# Patient Record
Sex: Male | Born: 1960 | Race: Black or African American | Hispanic: No | Marital: Single | Smoking: Former smoker
Health system: Southern US, Community
[De-identification: ages and names within clinical notes are randomized; demographics above are authoritative.]

## PROBLEM LIST (undated history)

## (undated) DIAGNOSIS — I700 Atherosclerosis of aorta: Secondary | ICD-10-CM

## (undated) DIAGNOSIS — I2089 Other forms of angina pectoris: Secondary | ICD-10-CM

## (undated) DIAGNOSIS — I509 Heart failure, unspecified: Secondary | ICD-10-CM

## (undated) DIAGNOSIS — R9431 Abnormal electrocardiogram [ECG] [EKG]: Secondary | ICD-10-CM

## (undated) DIAGNOSIS — R0609 Other forms of dyspnea: Secondary | ICD-10-CM

## (undated) DIAGNOSIS — I208 Other forms of angina pectoris: Secondary | ICD-10-CM

## (undated) DIAGNOSIS — I2510 Atherosclerotic heart disease of native coronary artery without angina pectoris: Secondary | ICD-10-CM

## (undated) DIAGNOSIS — G459 Transient cerebral ischemic attack, unspecified: Secondary | ICD-10-CM

## (undated) DIAGNOSIS — I10 Essential (primary) hypertension: Secondary | ICD-10-CM

## (undated) DIAGNOSIS — I214 Non-ST elevation (NSTEMI) myocardial infarction: Secondary | ICD-10-CM

## (undated) DIAGNOSIS — E785 Hyperlipidemia, unspecified: Secondary | ICD-10-CM

## (undated) DIAGNOSIS — I429 Cardiomyopathy, unspecified: Secondary | ICD-10-CM

## (undated) DIAGNOSIS — F17210 Nicotine dependence, cigarettes, uncomplicated: Secondary | ICD-10-CM

## (undated) DIAGNOSIS — I2584 Coronary atherosclerosis due to calcified coronary lesion: Secondary | ICD-10-CM

## (undated) HISTORY — DX: Atherosclerotic heart disease of native coronary artery without angina pectoris: I25.10

## (undated) HISTORY — DX: Other forms of dyspnea: R06.09

## (undated) HISTORY — DX: Cardiomyopathy, unspecified: I42.9

## (undated) HISTORY — DX: Hyperlipidemia, unspecified: E78.5

## (undated) HISTORY — DX: Non-ST elevation (NSTEMI) myocardial infarction: I21.4

## (undated) HISTORY — DX: Atherosclerotic heart disease of native coronary artery without angina pectoris: I25.84

## (undated) HISTORY — DX: Other forms of angina pectoris: I20.89

## (undated) HISTORY — DX: Atherosclerosis of aorta: I70.0

## (undated) HISTORY — DX: Abnormal electrocardiogram (ECG) (EKG): R94.31

## (undated) HISTORY — DX: Other forms of angina pectoris: I20.8

## (undated) HISTORY — DX: Nicotine dependence, cigarettes, uncomplicated: F17.210

## (undated) HISTORY — DX: Essential (primary) hypertension: I10

## (undated) HISTORY — DX: Transient cerebral ischemic attack, unspecified: G45.9

## (undated) HISTORY — DX: Heart failure, unspecified: I50.9

---

## 2010-09-04 ENCOUNTER — Emergency Department (HOSPITAL_COMMUNITY)
Admission: EM | Admit: 2010-09-04 | Discharge: 2010-09-04 | Disposition: A | Discharge status: Home / Self Care | Payer: Self-pay | Attending: Emergency Medicine | Admitting: Emergency Medicine

## 2010-09-04 ENCOUNTER — Emergency Department (HOSPITAL_COMMUNITY): Payer: Self-pay

## 2010-09-04 DIAGNOSIS — R079 Chest pain, unspecified: Secondary | ICD-10-CM | POA: Insufficient documentation

## 2010-09-04 DIAGNOSIS — R209 Unspecified disturbances of skin sensation: Secondary | ICD-10-CM | POA: Insufficient documentation

## 2010-09-04 DIAGNOSIS — IMO0002 Reserved for concepts with insufficient information to code with codable children: Secondary | ICD-10-CM | POA: Insufficient documentation

## 2015-08-11 ENCOUNTER — Emergency Department (HOSPITAL_COMMUNITY)
Admission: EM | Admit: 2015-08-11 | Discharge: 2015-08-11 | Disposition: A | Discharge status: Home / Self Care | Payer: Self-pay | Attending: Emergency Medicine | Admitting: Emergency Medicine

## 2015-08-11 ENCOUNTER — Encounter (HOSPITAL_COMMUNITY): Payer: Self-pay | Admitting: Emergency Medicine

## 2015-08-11 DIAGNOSIS — F17210 Nicotine dependence, cigarettes, uncomplicated: Secondary | ICD-10-CM | POA: Insufficient documentation

## 2015-08-11 DIAGNOSIS — D171 Benign lipomatous neoplasm of skin and subcutaneous tissue of trunk: Secondary | ICD-10-CM | POA: Insufficient documentation

## 2015-08-11 DIAGNOSIS — I10 Essential (primary) hypertension: Secondary | ICD-10-CM | POA: Insufficient documentation

## 2015-08-11 LAB — I-STAT CHEM 8, ED
BUN: 10 mg/dL (ref 6–20)
CALCIUM ION: 1.19 mmol/L (ref 1.12–1.23)
Chloride: 103 mmol/L (ref 101–111)
Creatinine, Ser: 0.7 mg/dL (ref 0.61–1.24)
Glucose, Bld: 93 mg/dL (ref 65–99)
HEMATOCRIT: 48 % (ref 39.0–52.0)
HEMOGLOBIN: 16.3 g/dL (ref 13.0–17.0)
Potassium: 3.9 mmol/L (ref 3.5–5.1)
SODIUM: 140 mmol/L (ref 135–145)
TCO2: 22 mmol/L (ref 0–100)

## 2015-08-11 MED ORDER — LISINOPRIL 20 MG PO TABS: 20.0000 mg | ORAL_TABLET | Freq: Every day | ORAL | Status: DC

## 2015-08-11 MED ORDER — HYDROCHLOROTHIAZIDE 25 MG PO TABS: 25.0000 mg | ORAL_TABLET | Freq: Every day | ORAL | Status: DC

## 2015-08-11 NOTE — Discharge Instructions (Signed)
You were seen today for a mass on her back. This is likely a benign growth. If he continues to grow or begins to bother you, you need to be evaluated further. You were noted to be significantly hypertensive. Because you have no symptoms, you were not treated in the ER but will be started on medications as an outpatient. You need to establish primary care and have a recheck.  Lipoma A lipoma is a noncancerous (benign) tumor that is made up of fat cells. This is a very common type of soft-tissue growth. Lipomas are usually found under the skin (subcutaneous). They may occur in any tissue of the body that contains fat. Common areas for lipomas to appear include the back, shoulders, buttocks, and thighs. Lipomas grow slowly, and they are usually painless. Most lipomas do not cause problems and do not require treatment. CAUSES The cause of this condition is not known. RISK FACTORS This condition is more likely to develop in:  People who are 75-42 years old.  People who have a family history of lipomas. SYMPTOMS A lipoma usually appears as a small, round bump under the skin. It may feel soft or rubbery, but the firmness can vary. Most lipomas are not painful. However, a lipoma may become painful if it is located in an area where it pushes on nerves. DIAGNOSIS A lipoma can usually be diagnosed with a physical exam. You may also have tests to confirm the diagnosis and to rule out other conditions. Tests may include:  Imaging tests, such as a CT scan or MRI.  Removal of a tissue sample to be looked at under a microscope (biopsy). TREATMENT Treatment is not needed for small lipomas that are not causing problems. If a lipoma continues to get bigger or it causes problems, removal is often the best option. Lipomas can also be removed to improve appearance. Removal of a lipoma is usually done with a surgery in which the fatty cells and the surrounding capsule are removed. Most often, a medicine that numbs the  area (local anesthetic) is used for this procedure. HOME CARE INSTRUCTIONS  Keep all follow-up visits as directed by your health care provider. This is important. SEEK MEDICAL CARE IF:  Your lipoma becomes larger or hard.  Your lipoma becomes painful, red, or increasingly swollen. These could be signs of infection or a more serious condition.   This information is not intended to replace advice given to you by your health care provider. Make sure you discuss any questions you have with your health care provider.   Document Released: 06/17/2002 Document Revised: 11/11/2014 Document Reviewed: 06/23/2014 Elsevier Interactive Patient Education 2016 [employer]. Hypertension Hypertension, commonly called high blood pressure, is when the force of blood pumping through your arteries is too strong. Your arteries are the blood vessels that carry blood from your heart throughout your body. A blood pressure reading consists of a higher number over a lower number, such as 110/72. The higher number (systolic) is the pressure inside your arteries when your heart pumps. The lower number (diastolic) is the pressure inside your arteries when your heart relaxes. Ideally you want your blood pressure below 120/80. Hypertension forces your heart to work harder to pump blood. Your arteries may become narrow or stiff. Having untreated or uncontrolled hypertension can cause heart attack, stroke, kidney disease, and other problems. RISK FACTORS Some risk factors for high blood pressure are controllable. Others are not.  Risk factors you cannot control include:   Race. You may  be at higher risk if you are African American.  Age. Risk increases with age.  Gender. Men are at higher risk than women before age 7 years. After age 70, women are at higher risk than men. Risk factors you can control include:  Not getting enough exercise or physical activity.  Being overweight.  Getting too much fat, sugar,  calories, or salt in your diet.  Drinking too much alcohol. SIGNS AND SYMPTOMS Hypertension does not usually cause signs or symptoms. Extremely high blood pressure (hypertensive crisis) may cause headache, anxiety, shortness of breath, and nosebleed. DIAGNOSIS To check if you have hypertension, your health care provider will measure your blood pressure while you are seated, with your arm held at the level of your heart. It should be measured at least twice using the same arm. Certain conditions can cause a difference in blood pressure between your right and left arms. A blood pressure reading that is higher than normal on one occasion does not mean that you need treatment. If it is not clear whether you have high blood pressure, you may be asked to return on a different day to have your blood pressure checked again. Or, you may be asked to monitor your blood pressure at home for 1 or more weeks. TREATMENT Treating high blood pressure includes making lifestyle changes and possibly taking medicine. Living a healthy lifestyle can help lower high blood pressure. You may need to change some of your habits. Lifestyle changes may include:  Following the DASH diet. This diet is high in fruits, vegetables, and whole grains. It is low in salt, red meat, and added sugars.  Keep your sodium intake below 2,300 mg per day.  Getting at least 30-45 minutes of aerobic exercise at least 4 times per week.  Losing weight if necessary.  Not smoking.  Limiting alcoholic beverages.  Learning ways to reduce stress. Your health care provider may prescribe medicine if lifestyle changes are not enough to get your blood pressure under control, and if one of the following is true:  You are 34-58 years of age and your systolic blood pressure is above 140.  You are 23 years of age or older, and your systolic blood pressure is above 150.  Your diastolic blood pressure is above 90.  You have diabetes, and your  systolic blood pressure is over [id number] or your diastolic blood pressure is over 90.  You have kidney disease and your blood pressure is above 140/90.  You have heart disease and your blood pressure is above 140/90. Your personal target blood pressure may vary depending on your medical conditions, your age, and other factors. HOME CARE INSTRUCTIONS  Have your blood pressure rechecked as directed by your health care provider.   Take medicines only as directed by your health care provider. Follow the directions carefully. Blood pressure medicines must be taken as prescribed. The medicine does not work as well when you skip doses. Skipping doses also puts you at risk for problems.  Do not smoke.   Monitor your blood pressure at home as directed by your health care provider. SEEK MEDICAL CARE IF:   You think you are having a reaction to medicines taken.  You have recurrent headaches or feel dizzy.  You have swelling in your ankles.  You have trouble with your vision. SEEK IMMEDIATE MEDICAL CARE IF:  You develop a severe headache or confusion.  You have unusual weakness, numbness, or feel faint.  You have severe chest or abdominal pain.  You vomit repeatedly.  You have trouble breathing. MAKE SURE YOU:   Understand these instructions.  Will watch your condition.  Will get help right away if you are not doing well or get worse.   This information is not intended to replace advice given to you by your health care provider. Make sure you discuss any questions you have with your health care provider.   Document Released: 06/27/2005 Document Revised: 11/11/2014 Document Reviewed: 04/19/2013 Elsevier Interactive Patient Education [employer].

## 2015-08-11 NOTE — ED Provider Notes (Signed)
CSN: [id number]     Arrival date & time 08/11/15  0506 History   First MD Initiated Contact with Patient 08/11/15 0510     Chief Complaint  Patient presents with  . Mass     (Consider location/radiation/quality/duration/timing/severity/associated sxs/prior Treatment) HPI  This is a 55 year old male with no reported past medical history who presents with a knot on his back. Patient reports he has had a knot on his back since November. It has been slowly growing. Over last 2 days it has begun to bother him more at night. He denies any pain but states it is uncomfortable for him to lay on it. Denies any redness or fevers. Patient denies history of high blood pressure but was noted to be hypertensive upon arrival to 240/132. Repeat blood pressure 181/117. Denies any chest pain or headache.  History reviewed. No pertinent past medical history. History reviewed. No pertinent past surgical history. History reviewed. No pertinent family history. Social History  Substance Use Topics  . Smoking status: Current Every Day Smoker -- 0.50 packs/day    Types: Cigarettes  . Smokeless tobacco: None  . Alcohol Use: Yes     Comment: 2 X weekly - 6pack - 12 pack    Review of Systems  Constitutional: Negative for fever.  Respiratory: Negative for chest tightness and shortness of breath.   Cardiovascular: Negative for chest pain.  Musculoskeletal:       Knot on back  Neurological: Negative for headaches.  All other systems reviewed and are negative.     Allergies  Review of patient's allergies indicates no known allergies.  Home Medications   Prior to Admission medications   Medication Sig Start Date End Date Taking? Authorizing Provider  hydrochlorothiazide (HYDRODIURIL) 25 MG tablet Take 1 tablet (25 mg total) by mouth daily. 08/11/15   Merryl Hacker, MD  lisinopril (PRINIVIL,ZESTRIL) 20 MG tablet Take 1 tablet (20 mg total) by mouth daily. 08/11/15   Merryl Hacker, MD   BP 240/132  mmHg  Pulse 53  Temp(Src) 98.1 F (36.7 C)  Resp 16  Ht 6\' 1"  (1.854 m)  Wt 152 lb (68.947 kg)  BMI 20.06 kg/m2  SpO2 99% Physical Exam  Constitutional: He is oriented to person, place, and time. He appears well-developed and well-nourished.  HENT:  Head: Normocephalic and atraumatic.  Cardiovascular: Normal rate, regular rhythm and normal heart sounds.   No murmur heard. Pulmonary/Chest: Effort normal and breath sounds normal. No respiratory distress. He has no wheezes.  Abdominal: Soft. Bowel sounds are normal. There is no tenderness. There is no rebound.  Musculoskeletal:  1 cm well-circumscribed soft mass noted over the right posterior chest, mobile, nontender, no overlying skin changes  Neurological: He is alert and oriented to person, place, and time.  Skin: Skin is warm and dry.  Psychiatric: He has a normal mood and affect.  Nursing note and vitals reviewed.   ED Course  Procedures (including critical care time) Labs Review Labs Reviewed  I-STAT CHEM 8, ED    Imaging Review No results found. I have personally reviewed and evaluated these images and lab results as part of my medical decision-making.   EKG Interpretation   Date/Time:  Tuesday August 11 2015 05:35:13 EST Ventricular Rate:  51 PR Interval:  180 QRS Duration: 93 QT Interval:  445 QTC Calculation: 410 R Axis:   43 Text Interpretation:  Sinus rhythm Probable left atrial enlargement Left  ventricular hypertrophy Early repolarization No significant change since  last  tracing Confirmed by Philicia Heyne  MD, Noble ([id number]) on 08/11/2015  5:56:31 AM      MDM   Final diagnoses:  Essential hypertension  Lipoma of back    Patient presents with a knot on his back. Ongoing over the last several months. It has benign features and is most consistent with a lipoma. Cyst is also a possibility. Doubt abscess. Discussed with the patient that he can continue to monitor and if it continues to bother him, he will  need follow-up for possible excision. He does have insurance with [place] [employer] but does not have primary physician. Discussed with that his blood pressure is up. EKG changes show LVH and he likely has hypertension at baseline. He is currently asymptomatic. Chem-8 was checked and patient will be started on lisinopril and HCTZ. He was given information for primary care follow-up and encouraged to establish primary care. He stated understanding.  After history, exam, and medical workup I feel the patient has been appropriately medically screened and is safe for discharge home. Pertinent diagnoses were discussed with the patient. Patient was given return precautions.     Merryl Hacker, MD 08/11/15 [phone]

## 2015-08-11 NOTE — ED Notes (Signed)
Pt reports he has had a "knot" on his back since Thanksgiving however it has "been getting bigger this past month."

## 2016-03-03 ENCOUNTER — Encounter (HOSPITAL_COMMUNITY): Payer: Self-pay | Admitting: *Deleted

## 2016-03-03 ENCOUNTER — Observation Stay (HOSPITAL_COMMUNITY)
Admission: EM | Admit: 2016-03-03 | Discharge: 2016-03-04 | Disposition: A | Discharge status: Home / Self Care | Payer: Self-pay | Attending: Oncology | Admitting: Oncology

## 2016-03-03 ENCOUNTER — Inpatient Hospital Stay (HOSPITAL_COMMUNITY): Payer: Self-pay

## 2016-03-03 ENCOUNTER — Emergency Department (HOSPITAL_COMMUNITY): Payer: Self-pay

## 2016-03-03 DIAGNOSIS — I110 Hypertensive heart disease with heart failure: Secondary | ICD-10-CM | POA: Insufficient documentation

## 2016-03-03 DIAGNOSIS — I429 Cardiomyopathy, unspecified: Secondary | ICD-10-CM | POA: Diagnosis present

## 2016-03-03 DIAGNOSIS — Z87891 Personal history of nicotine dependence: Secondary | ICD-10-CM

## 2016-03-03 DIAGNOSIS — R4701 Aphasia: Secondary | ICD-10-CM | POA: Insufficient documentation

## 2016-03-03 DIAGNOSIS — G459 Transient cerebral ischemic attack, unspecified: Principal | ICD-10-CM | POA: Diagnosis present

## 2016-03-03 DIAGNOSIS — Z7982 Long term (current) use of aspirin: Secondary | ICD-10-CM | POA: Insufficient documentation

## 2016-03-03 DIAGNOSIS — G8191 Hemiplegia, unspecified affecting right dominant side: Secondary | ICD-10-CM | POA: Insufficient documentation

## 2016-03-03 DIAGNOSIS — R9431 Abnormal electrocardiogram [ECG] [EKG]: Secondary | ICD-10-CM

## 2016-03-03 DIAGNOSIS — I119 Hypertensive heart disease without heart failure: Secondary | ICD-10-CM

## 2016-03-03 DIAGNOSIS — Z720 Tobacco use: Secondary | ICD-10-CM

## 2016-03-03 DIAGNOSIS — I5020 Unspecified systolic (congestive) heart failure: Secondary | ICD-10-CM | POA: Insufficient documentation

## 2016-03-03 DIAGNOSIS — F17210 Nicotine dependence, cigarettes, uncomplicated: Secondary | ICD-10-CM | POA: Insufficient documentation

## 2016-03-03 DIAGNOSIS — I10 Essential (primary) hypertension: Secondary | ICD-10-CM

## 2016-03-03 DIAGNOSIS — I159 Secondary hypertension, unspecified: Secondary | ICD-10-CM

## 2016-03-03 LAB — COMPREHENSIVE METABOLIC PANEL
ALBUMIN: 4.1 g/dL (ref 3.5–5.0)
ALK PHOS: 49 U/L (ref 38–126)
ALT: 20 U/L (ref 17–63)
ANION GAP: 7 (ref 5–15)
AST: 27 U/L (ref 15–41)
BILIRUBIN TOTAL: 0.8 mg/dL (ref 0.3–1.2)
BUN: 14 mg/dL (ref 6–20)
CALCIUM: 9.3 mg/dL (ref 8.9–10.3)
CO2: 23 mmol/L (ref 22–32)
Chloride: 106 mmol/L (ref 101–111)
Creatinine, Ser: 0.87 mg/dL (ref 0.61–1.24)
GFR calc Af Amer: >60 mL/min (ref >60)
GLUCOSE: 105 mg/dL — AB (ref 65–99)
Potassium: 3.9 mmol/L (ref 3.5–5.1)
Sodium: 136 mmol/L (ref 135–145)
TOTAL PROTEIN: 7.7 g/dL (ref 6.5–8.1)

## 2016-03-03 LAB — DIFFERENTIAL
Basophils Absolute: 0 10*3/uL (ref 0.0–0.1)
Basophils Relative: 1 %
EOS PCT: 3 %
Eosinophils Absolute: 0.2 10*3/uL (ref 0.0–0.7)
LYMPHS ABS: 2.3 10*3/uL (ref 0.7–4.0)
LYMPHS PCT: 37 %
MONOS PCT: 9 %
Monocytes Absolute: 0.6 10*3/uL (ref 0.1–1.0)
NEUTROS PCT: 50 %
Neutro Abs: 3.2 10*3/uL (ref 1.7–7.7)

## 2016-03-03 LAB — PROTIME-INR
INR: 0.97
Prothrombin Time: 12.9 seconds (ref 11.4–15.2)

## 2016-03-03 LAB — TROPONIN I
Troponin I: <0.03 ng/mL
Troponin I: <0.03 ng/mL

## 2016-03-03 LAB — I-STAT CHEM 8, ED
BUN: 16 mg/dL (ref 6–20)
CALCIUM ION: 1.13 mmol/L (ref 1.13–1.30)
CHLORIDE: 105 mmol/L (ref 101–111)
Creatinine, Ser: 0.8 mg/dL (ref 0.61–1.24)
GLUCOSE: 102 mg/dL — AB (ref 65–99)
HCT: 46 % (ref 39.0–52.0)
Hemoglobin: 15.6 g/dL (ref 13.0–17.0)
Potassium: 3.9 mmol/L (ref 3.5–5.1)
Sodium: 141 mmol/L (ref 135–145)
TCO2: 24 mmol/L (ref 0–100)

## 2016-03-03 LAB — CBC
HEMATOCRIT: 43.1 % (ref 39.0–52.0)
HEMOGLOBIN: 14.1 g/dL (ref 13.0–17.0)
MCH: 29.2 pg (ref 26.0–34.0)
MCHC: 32.7 g/dL (ref 30.0–36.0)
MCV: 89.2 fL (ref 78.0–100.0)
Platelets: 259 10*3/uL (ref 150–400)
RBC: 4.83 MIL/uL (ref 4.22–5.81)
RDW: 13.4 % (ref 11.5–15.5)
WBC: 6.3 10*3/uL (ref 4.0–10.5)

## 2016-03-03 LAB — RAPID URINE DRUG SCREEN, HOSP PERFORMED
Amphetamines: NOT DETECTED
Barbiturates: NOT DETECTED
Benzodiazepines: NOT DETECTED
Cocaine: NOT DETECTED
Opiates: NOT DETECTED
Tetrahydrocannabinol: NOT DETECTED

## 2016-03-03 LAB — APTT: APTT: 31 s (ref 24–36)

## 2016-03-03 LAB — I-STAT TROPONIN, ED: Troponin i, poc: 0 ng/mL (ref 0.00–0.08)

## 2016-03-03 LAB — CBG MONITORING, ED: Glucose-Capillary: 87 mg/dL (ref 65–99)

## 2016-03-03 MED ORDER — ASPIRIN 81 MG PO CHEW
81.0000 mg | CHEWABLE_TABLET | Freq: Every day | ORAL | Status: DC
  Administered 2016-03-03 – 2016-03-04 (×2): 81 mg via ORAL
  Filled 2016-03-03 (×2): qty 1

## 2016-03-03 MED ORDER — ENOXAPARIN SODIUM 40 MG/0.4ML ~~LOC~~ SOLN: 40.0000 mg | SUBCUTANEOUS | Status: DC

## 2016-03-03 MED ORDER — STROKE: EARLY STAGES OF RECOVERY BOOK
Freq: Once | Status: AC
  Administered 2016-03-03: 20:00:00
  Filled 2016-03-03: qty 1

## 2016-03-03 MED ORDER — STUDY - INVESTIGATIONAL DRUG SIMPLE RECORD
600.0000 mg | Status: AC
  Filled 2016-03-03: qty 0.12

## 2016-03-03 MED ORDER — NICARDIPINE HCL IN NACL 20-0.86 MG/200ML-% IV SOLN: 5.0000 mg/h | Freq: Once | INTRAVENOUS | Status: DC

## 2016-03-03 MED ORDER — STUDY - INVESTIGATIONAL DRUG SIMPLE RECORD
75.0000 mg | Freq: Every day | Status: DC
  Administered 2016-03-04: 75 mg via ORAL
  Filled 2016-03-03: qty 0.01

## 2016-03-03 MED ORDER — IOPAMIDOL (ISOVUE-370) INJECTION 76%
INTRAVENOUS | Status: AC
  Filled 2016-03-03: qty 50

## 2016-03-03 MED ORDER — ASPIRIN 81 MG PO CHEW: 81.0000 mg | CHEWABLE_TABLET | Freq: Every day | ORAL | Status: DC

## 2016-03-03 MED ORDER — AMLODIPINE BESYLATE 5 MG PO TABS
10.0000 mg | ORAL_TABLET | Freq: Every day | ORAL | Status: DC
  Administered 2016-03-03 – 2016-03-04 (×2): 10 mg via ORAL
  Filled 2016-03-03 (×2): qty 2

## 2016-03-03 NOTE — ED Provider Notes (Signed)
[facility] DEPT Provider Note   CSN: [id number] Arrival date & time: 03/03/16  [id number]   An emergency department physician performed an initial assessment on this suspected stroke patient at 62.  History   Chief Complaint Chief Complaint  Patient presents with  . Code Stroke    HPI Sean Leblanc is a 55 y.o. male.  The history is provided by the patient and the EMS personnel.  Weakness  This is a new problem. The current episode started 1 to 2 hours ago. The problem occurs constantly. The problem has been gradually worsening. Associated symptoms include headaches. Pertinent negatives include no chest pain and no shortness of breath. Nothing aggravates the symptoms. Nothing relieves the symptoms. He has tried nothing for the symptoms. The treatment provided no relief.    No past medical history on file.  Patient Active Problem List   Diagnosis Date Noted  . TIA (transient ischemic attack) 03/03/2016  . HTN (hypertension) 03/03/2016  . Tobacco abuse 03/03/2016    No past surgical history on file.     Home Medications    Prior to Admission medications   Medication Sig Start Date End Date Taking? Authorizing Provider  ibuprofen (ADVIL,MOTRIN) 200 MG tablet Take 200 mg by mouth every 6 (six) hours as needed for moderate pain.   Yes Historical Provider, MD  hydrochlorothiazide (HYDRODIURIL) 25 MG tablet Take 1 tablet (25 mg total) by mouth daily. Patient not taking: Reported on 03/03/2016 08/11/15   Merryl Hacker, MD  lisinopril (PRINIVIL,ZESTRIL) 20 MG tablet Take 1 tablet (20 mg total) by mouth daily. Patient not taking: Reported on 03/03/2016 08/11/15   Merryl Hacker, MD    Family History No family history on file.  Social History Social History  Substance Use Topics  . Smoking status: Current Every Day Smoker    Packs/day: 0.50    Types: Cigarettes  . Smokeless tobacco: Not on file  . Alcohol use Yes     Comment: 2 X weekly - 6pack - 12 pack      Allergies   Review of patient's allergies indicates no known allergies.   Review of Systems Review of Systems  Respiratory: Negative for shortness of breath.   Cardiovascular: Negative for chest pain.  Neurological: Positive for weakness (right sided facial droop) and headaches.  All other systems reviewed and are negative.    Physical Exam Updated Vital Signs BP (!) 176/133   Pulse (!) 51   Temp 98.1 F (36.7 C)   Resp 17   SpO2 100%   Physical Exam  Constitutional: He appears well-developed and well-nourished.  HENT:  Head: Normocephalic and atraumatic.  Eyes: Conjunctivae are normal.  Neck: Neck supple.  Cardiovascular: Normal rate and regular rhythm.   No murmur heard. Pulmonary/Chest: Effort normal and breath sounds normal. No respiratory distress.  Abdominal: Soft. There is no tenderness.  Musculoskeletal: He exhibits no edema.  Neurological: He is alert. A cranial nerve deficit (right facial droop) is present. Coordination abnormal. GCS eye subscore is 4. GCS verbal subscore is 5. GCS motor subscore is 6.  Right arm with 3/5 strength Left arm with 5/5 strength  Skin: Skin is warm and dry.  Psychiatric: He has a normal mood and affect.  Nursing note and vitals reviewed.    ED Treatments / Results  Labs (all labs ordered are listed, but only abnormal results are displayed) Labs Reviewed  COMPREHENSIVE METABOLIC PANEL - Abnormal; Notable for the following:       Result  Value   Glucose, Bld 105 (*)    All other components within normal limits  I-STAT CHEM 8, ED - Abnormal; Notable for the following:    Glucose, Bld 102 (*)    All other components within normal limits  PROTIME-INR  APTT  CBC  DIFFERENTIAL  URINE RAPID DRUG SCREEN, HOSP PERFORMED  TROPONIN I  TROPONIN I  TROPONIN I  I-STAT TROPOININ, ED  CBG MONITORING, ED    EKG  EKG Interpretation None       Radiology Mr Brain Wo Contrast  Result Date: 03/03/2016 CLINICAL DATA:   Right facial droop and right body numbness. History of hypertension and tobacco abuse. EXAM: MRI HEAD WITHOUT CONTRAST MRA HEAD WITHOUT CONTRAST TECHNIQUE: Multiplanar, multiecho pulse sequences of the brain and surrounding structures were obtained without intravenous contrast. Angiographic images of the head were obtained using MRA technique without contrast. COMPARISON:  CT 03/03/2016 FINDINGS: MRI HEAD FINDINGS Mild atrophy for age.  Negative for hydrocephalus Extensive periventricular and deep white matter white matter disease. Numerous confluent and patchy areas of hyperintensity throughout the cerebral white matter widely distributed and most consistent with microvascular ischemia, advanced for age. 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 Brainstem and cerebellum normal. 1 cm area of hyperintensity in the right parietal white matter on diffusion most likely is T2 shine through. No definite area of acute infarct. Negative for mass or edema.  No shift of the midline structures. Negative for intracranial hemorrhage. Normal orbital structures. Paranasal sinuses clear. Pituitary normal in size. MRA HEAD FINDINGS Hypoplastic basilar due to fetal origin of the posterior cerebral arteries bilaterally. Both vertebral arteries are small and contribute to the basilar. PICA patent bilaterally. Basilar patent to the superior cerebellar arteries bilaterally. Posterior cerebral arteries both supplied via the carotid artery with mild atherosclerotic disease in the posterior cerebral artery bilaterally. Cavernous carotid widely patent bilaterally. No significant stenosis. Anterior and middle cerebral arteries patent bilaterally without stenosis. Negative for cerebral aneurysm. IMPRESSION: Extensive chronic white matter changes most consistent with microvascular ischemia due to poorly controlled hypertension. Negative for acute infarct. Mild atherosclerotic disease in the posterior cerebral arteries bilaterally. These are  both supplied by the internal carotid artery bilaterally. Anterior circulation intact. No large vessel occlusion. Electronically Signed   By: Franchot Gallo M.D.   On: 03/03/2016 13:58   Mr Jodene Nam Head/brain [id number] Cm  Result Date: 03/03/2016 CLINICAL DATA:  Right facial droop and right body numbness. History of hypertension and tobacco abuse. EXAM: MRI HEAD WITHOUT CONTRAST MRA HEAD WITHOUT CONTRAST TECHNIQUE: Multiplanar, multiecho pulse sequences of the brain and surrounding structures were obtained without intravenous contrast. Angiographic images of the head were obtained using MRA technique without contrast. COMPARISON:  CT 03/03/2016 FINDINGS: MRI HEAD FINDINGS Mild atrophy for age.  Negative for hydrocephalus Extensive periventricular and deep white matter white matter disease. Numerous confluent and patchy areas of hyperintensity throughout the cerebral white matter widely distributed and most consistent with microvascular ischemia, advanced for age. 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 Brainstem and cerebellum normal. 1 cm area of hyperintensity in the right parietal white matter on diffusion most likely is T2 shine through. No definite area of acute infarct. Negative for mass or edema.  No shift of the midline structures. Negative for intracranial hemorrhage. Normal orbital structures. Paranasal sinuses clear. Pituitary normal in size. MRA HEAD FINDINGS Hypoplastic basilar due to fetal origin of the posterior cerebral arteries bilaterally. Both vertebral arteries are small and contribute to the basilar. PICA patent  bilaterally. Basilar patent to the superior cerebellar arteries bilaterally. Posterior cerebral arteries both supplied via the carotid artery with mild atherosclerotic disease in the posterior cerebral artery bilaterally. Cavernous carotid widely patent bilaterally. No significant stenosis. Anterior and middle cerebral arteries patent bilaterally without stenosis. Negative for cerebral  aneurysm. IMPRESSION: Extensive chronic white matter changes most consistent with microvascular ischemia due to poorly controlled hypertension. Negative for acute infarct. Mild atherosclerotic disease in the posterior cerebral arteries bilaterally. These are both supplied by the internal carotid artery bilaterally. Anterior circulation intact. No large vessel occlusion. Electronically Signed   By: Franchot Gallo M.D.   On: 03/03/2016 13:58   Ct Head Code Stroke W/o Cm  Addendum Date: 03/03/2016   ADDENDUM REPORT: 03/03/2016 08:18 ADDENDUM: Study discussed by telephone with Dr. Elson Clan 03/03/2016 at [phone]. Electronically Signed   By: Genevie Ann M.D.   On: 03/03/2016 08:18   Result Date: 03/03/2016 CLINICAL DATA:  Code stroke. 55 year old male with right side weakness and right facial droop. Initial encounter. EXAM: CT HEAD WITHOUT CONTRAST TECHNIQUE: Contiguous axial images were obtained from the base of the skull through the vertex without intravenous contrast. COMPARISON:  None. FINDINGS: Visible paranasal sinuses and mastoids are clear. No acute osseous abnormality identified. Visualized orbits and scalp soft tissues are within normal limits. No acute intracranial hemorrhage identified. No midline shift, mass effect, or evidence of intracranial mass lesion. Patchy bilateral white matter hypodensity with anterior limb internal capsule involvement in both hemispheres. Superimposed hypodensity in the left caudate nucleus. No cortically based acute infarct identified. No suspicious intracranial vascular hyperdensity. ASPECTS [facility] Stroke Program Early CT Score) Total score (0-10 with 10 being normal): 10 IMPRESSION: 1. 2. Evidence of chronic small vessel disease. No acute cortically based infarct or acute intracranial hemorrhage identified. 3. ASPECTS is 10. Electronically Signed: By: Genevie Ann M.D. On: 03/03/2016 07:56    Procedures Procedures (including critical care time)  CRITICAL CARE Performed  by: Merrily Pew Total critical care time: 45 minutes Critical care time was exclusive of separately billable procedures and treating other patients. Critical care was necessary to treat or prevent imminent or life-threatening deterioration. Critical care was time spent personally by me on the following activities: development of treatment plan with patient and/or surrogate as well as nursing, discussions with consultants, evaluation of patient's response to treatment, examination of patient, obtaining history from patient or surrogate, ordering and performing treatments and interventions, ordering and review of laboratory studies, ordering and review of radiographic studies, pulse oximetry and re-evaluation of patient's condition.   Medications Ordered in ED Medications  iopamidol (ISOVUE-370) 76 % injection (not administered)   stroke: mapping our early stages of recovery book (not administered)  enoxaparin (LOVENOX) injection 40 mg (not administered)  aspirin chewable tablet 81 mg (not administered)  amLODipine (NORVASC) tablet 10 mg (not administered)     Initial Impression / Assessment and Plan / ED Course  I have reviewed the triage vital signs and the nursing notes.  Pertinent labs & imaging results that were available during my care of the patient were reviewed by me and considered in my medical decision making (see chart for details).  55 yo M here As a code stroke secondary to progressively worsening right facial weakness and slurred speech that started around 0 700 this morning. On examination in the ED initially had right facial droop and could barely move his right arm. Otherwise airway was intact and rest of neurologic exam was appropriate as documented above. Was  taken immediately to CT scanner which was negative for any acute bleed or acute infarct however symptoms resolved totally while in the CT scanner and remained baseline neurologic exam on multiple reevaluations in the  emergency department however was significantly hypertensive. Patient started on a Cardene drip and we will admit to medicine stepdown for further stroke workup and blood pressure control.  Clinical Course      Final Clinical Impressions(s) / ED Diagnoses   Final diagnoses:  Transient cerebral ischemia, unspecified transient cerebral ischemia type  Secondary hypertension, unspecified  TIA (transient ischemic attack)    New Prescriptions New Prescriptions   No medications on file     Merrily Pew, MD 03/03/16 [phone]

## 2016-03-03 NOTE — ED Notes (Signed)
Went to [facility]

## 2016-03-03 NOTE — Progress Notes (Signed)
Patient admitted to room 5M03 at this time. Alert and in stable condition.

## 2016-03-03 NOTE — ED Notes (Signed)
Initial dose of 600mg  Plavix given by [facility].

## 2016-03-03 NOTE — ED Notes (Signed)
Remains in [facility]

## 2016-03-03 NOTE — ED Triage Notes (Signed)
Pt arrives via [facility] was at work and  He was normal and at 7am and he got dizzy and had slurred speech and rt arm was numb and tingling, and had facial droop rt. It all got worse upon arrival to er and then after ct pt was better. Dr Tasia Catchings at bedside

## 2016-03-03 NOTE — ED Notes (Signed)
Pt passed stroke swollow screen, pharmacy getting asa order straight

## 2016-03-03 NOTE — H&P (Signed)
Date: 03/03/2016               Patient Name:  Sean Leblanc MRN: [id number]  DOB: September 07, 1960 Age / Sex: 55 y.o., male   PCP: No primary care provider on file.         Medical Service: Internal Medicine Teaching Service         Attending Physician: Dr. Annia Belt, MD    First Contact: Dr. Holley Raring Pager: [phone]  Second Contact: Dr. Charlott Rakes Pager: [phone]       After Hours (After 5p/  First Contact Pager: [phone]  weekends / holidays): Second Contact Pager: [phone]   Chief Complaint: rt sided facial droop and rt body numbness  History of Present Illness: 54M w/ PMHx of HTN and tobacco abuse who presents with rt sided weakness, slurred speech, and rt sided facial droop. He was at work around 7:00am this morning when he developed dizziness that progressed to rt sided tingling. His right side became numb and his co workers noted that his speech was slurred. Per EMS pt's symptoms worsened en route to the hospital which then improved s/p CT brain per ED provider.  On ED arrival pt's BP was elevated up to 189/117 and CT brain was wnl. On exam pt feels back to his baseline. He was previously dx with HTN in the ED and was given an rx for BP meds and PCP resources however he never filled his rx nor saw a PCP.   Meds:  Not taking any medications at home.    Allergies: Allergies as of 03/03/2016  . (No Known Allergies)   No past medical history on file.  Family History: parents and brother have HTN, no hx of CVA  Social History: Pt smokes 7 cigarettes/day x 30-40 years. He works packing boxes with clothing. He lives w/ his brother. He drinks beer socially, denies illicit drug use.   Review of Systems: A complete ROS was negative except as per HPI.   Physical Exam: Blood pressure (!) 189/117, pulse (!) 45, temperature 98.8 F (37.1 C), temperature source Oral, resp. rate 15, SpO2 100 %. Physical Exam  Constitutional: He is oriented to person, place, and  time. He appears well-developed and well-nourished. No distress.  HENT:  Head: Normocephalic and atraumatic.  Nose: Nose normal.  Mouth/Throat: Oropharynx is clear and moist. No oropharyngeal exudate.  Eyes: Conjunctivae and EOM are normal. Pupils are equal, round, and reactive to light. No scleral icterus.  Cardiovascular: Normal rate, regular rhythm and normal heart sounds.  Exam reveals no gallop and no friction rub.   No murmur heard. Pulmonary/Chest: Effort normal and breath sounds normal. No respiratory distress. He has no wheezes. He has no rales.  Abdominal: Soft. Bowel sounds are normal. He exhibits no distension and no mass. There is no tenderness. There is no rebound and no guarding.  Musculoskeletal: He exhibits no edema.  Neurological: He is alert and oriented to person, place, and time. No cranial nerve deficit. Coordination normal.  Able to do repetitive finger pinching, finger to nose, and heel to shin b/l, 5/5 UE and LE strength, sensation intact   Skin: Skin is warm and dry. No rash noted. He is not diaphoretic. No erythema. No pallor.    EKG-- SR w/ LVH, ST elevation in V1-3  CT head--Evidence of chronic small vessel disease. No acute cortically based infarct or acute intracranial hemorrhage identified.  Assessment & Plan by Problem: Principal Problem:  TIA (transient ischemic attack) Active Problems:   HTN (hypertension)   Tobacco abuse  TIA--pt presents with rt sided weakness, rt facial droop and slurred speech that started around 7:00am and is resolved on examination which likely represents a TIA. He has RF for CVA included HTN and tobacco abuse. CT head was negative. Will continue stroke work up and start to lower BP once MRI rules out CVA.  - admit to tele - MRI brain, allowing permissive HTN <220/120 until results come back - asa 81 daily - hgba1c, fasting lipid panel - ECHO, carotid dopplers - PT/OT eval - neuro checks q4h - counseled on tobacco  cessation  HTN-- pt has signs of uncontrolled HTN on EKG w/ LVH noted. Allowing permissive htn, can start on a diuretic or CCB like norvasc once CVA is ruled out.   ST elevation on EKG- ST elevation in V1-V3, likely early repol changes. Pt denies any chest pain. POC trop was negative.  - trend trops x 3, repeat EKG in the am.   DVTppx - lovenox Diet- hh   Dispo: Admit patient to Inpatient with expected length of stay greater than 2 midnights.  Signed: Norman Herrlich, MD 03/03/2016, 9:41 AM  Pager: [phone]

## 2016-03-03 NOTE — ED Notes (Signed)
Dr Hulen Luster aware of bp and does not want cardene drip started  And no meds till after MRI and call if bp is 220/120

## 2016-03-03 NOTE — Progress Notes (Signed)
RESEARCH STUDY  NOTE Patient was randomized into the POINT stroke prevention trial after meeting inclusion/exclusion criteria and signing inform consent after being given opportunity to ask questions.No study specific procedure was done prior to his signing consent form.  Antony Contras, MD Medical Director [facility] Pager: [phone] 03/03/2016 4:51 PM

## 2016-03-03 NOTE — ED Notes (Signed)
Patient eating snacks from home. Ordered heart healthy diet.

## 2016-03-03 NOTE — Consult Note (Signed)
Initial Neurological Consultation                      NEURO HOSPITALIST CONSULT NOTE   Requestig physician: Dr. Beryle Beams   Reason for Consult:  Aphasia and right hemiplegia   HPI:                                                                                                                                           Sean Leblanc is an 55 y.o. male who presented with the onset of aphasia, slurred speech, right facial droop, and right upper extremity weakness that was noted at 7 AM. EMS was contacted and the patient was brought to the emergency room. Upon arrival in the emergency room Sean Leblanc had some mild word finding difficulties. He was observed to have a notable right facial droop. He had a slight right pronator drift. He appeared to have some minimal weakness of his right lower extremity.  Sean Leblanc was taken to CT where his imaging studies were normal. A CTA was contemplated. However, he was reexamined immediately after the CT and his symptoms had almost completely resolved. He had a minimal pronator drift which was quite subtle. His facial droop had resolved. His speech was back to normal.   Past medical history: Hypertension   No past surgical history on file.  MEDICATIONS:                                                                                                                     I have reviewed the patient's current medications.  No Known Allergies   Social History:  reports that he has been smoking Cigarettes.  He has been smoking about 0.50 packs per day. He does not have any smokeless tobacco history on file. He reports that he drinks alcohol. His drug history is not on file.  No family history on file.   ROS:  History obtained from chart review  General ROS: negative for - chills, fatigue, fever, night sweats, weight gain  or weight loss Psychological ROS: negative for - behavioral disorder, hallucinations, memory difficulties, mood swings or suicidal ideation Ophthalmic ROS: negative for - blurry vision, double vision, eye pain or loss of vision ENT ROS: negative for - epistaxis, nasal discharge, oral lesions, sore throat, tinnitus or vertigo Allergy and Immunology ROS: negative for - hives or itchy/watery eyes Hematological and Lymphatic ROS: negative for - bleeding problems, bruising or swollen lymph nodes Endocrine ROS: negative for - galactorrhea, hair pattern changes, polydipsia/polyuria or temperature intolerance Respiratory ROS: negative for - cough, hemoptysis, shortness of breath or wheezing Cardiovascular ROS: negative for - chest pain, dyspnea on exertion, edema or irregular heartbeat Gastrointestinal ROS: negative for - abdominal pain, diarrhea, hematemesis, nausea/vomiting or stool incontinence Genito-Urinary ROS: negative for - dysuria, hematuria, incontinence or urinary frequency/urgency Musculoskeletal ROS: negative for - joint swelling or muscular weakness Neurological ROS: as noted in HPI Dermatological ROS: negative for rash and skin lesion changes   General Exam                                                                                                      Blood pressure (!) 189/117, pulse (!) 45, temperature 98.8 F (37.1 C), temperature source Oral, resp. rate 15, SpO2 100 %. HEENT-  Normocephalic, no lesions, without obvious abnormality.  Normal external eye and conjunctiva.  Normal TM's bilaterally.  Normal auditory canals and external ears. Normal external nose, mucus membranes and septum.  Normal pharynx. Cardiovascular- regular rate and rhythm, S1, S2 normal, no murmur, click, rub or gallop, pulses palpable throughout   Lungs- chest clear, no wheezing, rales, normal symmetric air entry, Heart exam - S1, S2 normal, no murmur, no gallop, rate regular Abdomen- soft, non-tender;  bowel sounds normal; no masses,  no organomegaly Extremities- less then 2 second capillary refill Lymph-no adenopathy palpable Musculoskeletal-no joint tenderness, deformity or swelling Skin-warm and dry, no hyperpigmentation, vitiligo, or suspicious lesions  Neurological Examination Mental Status: Alert, oriented, thought content appropriate.  Speech fluent without evidence of aphasia.  Able to follow 3 step commands without difficulty. Cranial Nerves: Cranial nerves II through XII are intact Motor: Right : Upper extremity   5/5    Left:     Upper extremity   5/5  Lower extremity   5/5     Lower extremity   5/5 Tone and bulk:normal tone throughout; no atrophy noted Sensory: Pinprick and light touch intact throughout, bilaterally Deep Tendon Reflexes: 2+ and symmetric throughout Plantars: Right: downgoing   Left: downgoing Cerebellar: normal finger-to-nose, normal rapid alternating movements and normal heel-to-shin test     Lab Results: Basic Metabolic Panel:  Recent Labs Lab 03/03/16 0748 03/03/16 0753  NA 136 141  K 3.9 3.9  CL 106 105  CO2 23  --   GLUCOSE 105* 102*  BUN 14 16  CREATININE 0.87 0.80  CALCIUM 9.3  --     Liver Function Tests:  Recent Labs Lab 03/03/16 0748  AST 27  ALT 20  ALKPHOS 49  BILITOT 0.8  PROT 7.7  ALBUMIN 4.1   No results for input(s): LIPASE, AMYLASE in the last 168 hours. No results for input(s): AMMONIA in the last 168 hours.  CBC:  Recent Labs Lab 03/03/16 0748 03/03/16 0753  WBC 6.3  --   NEUTROABS 3.2  --   HGB 14.1 15.6  HCT 43.1 46.0  MCV 89.2  --   PLT 259  --     Cardiac Enzymes: No results for input(s): CKTOTAL, CKMB, CKMBINDEX, TROPONINI in the last 168 hours.  Lipid Panel: No results for input(s): CHOL, TRIG, HDL, CHOLHDL, VLDL, LDLCALC in the last 168 hours.  CBG:  Recent Labs Lab 03/03/16 0805  GLUCAP 66    Microbiology: No results found for this or any previous visit.  Coagulation  Studies:  Recent Labs  03/03/16 0748  LABPROT 12.9  INR 0.97    Imaging: Ct Head Code Stroke W/o Cm  Addendum Date: 03/03/2016   ADDENDUM REPORT: 03/03/2016 08:18 ADDENDUM: Study discussed by telephone with Dr. Elson Clan 03/03/2016 at [phone]. Electronically Signed   By: Genevie Ann M.D.   On: 03/03/2016 08:18   Result Date: 03/03/2016 CLINICAL DATA:  Code stroke. 55 year old male with right side weakness and right facial droop. Initial encounter. EXAM: CT HEAD WITHOUT CONTRAST TECHNIQUE: Contiguous axial images were obtained from the base of the skull through the vertex without intravenous contrast. COMPARISON:  None. FINDINGS: Visible paranasal sinuses and mastoids are clear. No acute osseous abnormality identified. Visualized orbits and scalp soft tissues are within normal limits. No acute intracranial hemorrhage identified. No midline shift, mass effect, or evidence of intracranial mass lesion. Patchy bilateral white matter hypodensity with anterior limb internal capsule involvement in both hemispheres. Superimposed hypodensity in the left caudate nucleus. No cortically based acute infarct identified. No suspicious intracranial vascular hyperdensity. ASPECTS [facility] Stroke Program Early CT Score) Total score (0-10 with 10 being normal): 10 IMPRESSION: 1. 2. Evidence of chronic small vessel disease. No acute cortically based infarct or acute intracranial hemorrhage identified. 3. ASPECTS is 10. Electronically Signed: By: Genevie Ann M.D. On: 03/03/2016 07:56    Assessment/Plan:  Sean Leblanc is a pleasant 55 year old gentleman who presented with a aphasia and right hemiplegia. However, the symptoms resolved while he was in CT. There was a question at that point perhaps a very minimal right pronator drift. His NIHSS was 1. Given that his symptoms had essentially resolved, the patient was not a candidate for TPA. He will be admitted for routine stroke evaluations.  1. HgbA1c, fasting lipid panel 2. MRI,  MRA  of the brain without contrast 3. PT consult, OT consult, Speech consult 4. Echocardiogram 5. Carotid dopplers 6. Prophylactic therapy 7. Risk factor modification 8. Telemetry monitoring 9. Frequent neuro checks 10 NPO until passes stroke swallow screen 11 please page stroke NP  Or  PA  Or MD from 8am -4 pm  as this patient from this time will be  followed by the stroke.   You can look them up on [email or web address]  Password [email or web address]    Dmarion Perfect A. Tasia Catchings, M.D. Neurohospitalist Phone: [phone]  03/03/2016, 10:09 AM

## 2016-03-04 ENCOUNTER — Inpatient Hospital Stay (HOSPITAL_BASED_OUTPATIENT_CLINIC_OR_DEPARTMENT_OTHER): Payer: Self-pay

## 2016-03-04 DIAGNOSIS — G458 Other transient cerebral ischemic attacks and related syndromes: Secondary | ICD-10-CM

## 2016-03-04 DIAGNOSIS — I160 Hypertensive urgency: Secondary | ICD-10-CM | POA: Insufficient documentation

## 2016-03-04 DIAGNOSIS — G459 Transient cerebral ischemic attack, unspecified: Secondary | ICD-10-CM

## 2016-03-04 DIAGNOSIS — I169 Hypertensive crisis, unspecified: Secondary | ICD-10-CM | POA: Insufficient documentation

## 2016-03-04 DIAGNOSIS — I429 Cardiomyopathy, unspecified: Secondary | ICD-10-CM | POA: Diagnosis present

## 2016-03-04 HISTORY — DX: Hypertensive urgency: I16.0

## 2016-03-04 LAB — TROPONIN I: Troponin I: <0.03 ng/mL (ref <0.03)

## 2016-03-04 LAB — VAS US CAROTID
LCCADDIAS: -23 cm/s
LCCAPSYS: 105 cm/s
LEFT ECA DIAS: -11 cm/s
LEFT VERTEBRAL DIAS: -11 cm/s
LICADSYS: -82 cm/s
Left CCA dist sys: -76 cm/s
Left CCA prox dias: 26 cm/s
Left ICA dist dias: -36 cm/s
Left ICA prox dias: -29 cm/s
Left ICA prox sys: -62 cm/s
RCCADSYS: -54 cm/s
RCCAPDIAS: -18 cm/s
RIGHT ECA DIAS: -9 cm/s
RIGHT VERTEBRAL DIAS: -15 cm/s
Right CCA prox sys: -73 cm/s

## 2016-03-04 LAB — ECHOCARDIOGRAM COMPLETE
Height: 73 in
Weight: 2398.6 oz

## 2016-03-04 LAB — LIPID PANEL
CHOL/HDL RATIO: 3.8 ratio
CHOLESTEROL: 166 mg/dL (ref 0–200)
HDL: 44 mg/dL (ref >40)
LDL Cholesterol: 99 mg/dL (ref 0–99)
TRIGLYCERIDES: 113 mg/dL (ref <150)
VLDL: 23 mg/dL (ref 0–40)

## 2016-03-04 MED ORDER — ASPIRIN 81 MG PO CHEW: 81.0000 mg | CHEWABLE_TABLET | Freq: Every day | ORAL | 0 refills | Status: DC

## 2016-03-04 MED ORDER — LISINOPRIL 10 MG PO TABS: 10.0000 mg | ORAL_TABLET | Freq: Every day | ORAL | Status: DC

## 2016-03-04 MED ORDER — LISINOPRIL 10 MG PO TABS: 10.0000 mg | ORAL_TABLET | Freq: Every day | ORAL | 0 refills | Status: DC

## 2016-03-04 MED ORDER — STUDY - INVESTIGATIONAL DRUG SIMPLE RECORD: 600.0000 mg | Status: AC

## 2016-03-04 MED ORDER — LISINOPRIL 10 MG PO TABS
10.0000 mg | ORAL_TABLET | Freq: Every day | ORAL | Status: DC
  Administered 2016-03-04: 10 mg via ORAL

## 2016-03-04 MED ORDER — METFORMIN HCL 500 MG PO TABS
500.0000 mg | ORAL_TABLET | Freq: Two times a day (BID) | ORAL | Status: DC
  Administered 2016-03-04: 500 mg via ORAL
  Filled 2016-03-04: qty 1

## 2016-03-04 MED ORDER — AMLODIPINE BESYLATE 5 MG PO TABS: 5.0000 mg | ORAL_TABLET | Freq: Every day | ORAL | Status: DC

## 2016-03-04 MED ORDER — STUDY - INVESTIGATIONAL DRUG SIMPLE RECORD: 75.0000 mg | Freq: Every day | Status: DC

## 2016-03-04 NOTE — Progress Notes (Signed)
Patient is discharged from room 5M03 at this time. Alert and in stable condition. IV site d/c'd as well as tele. Instructions read to patient with understanding verbalized. Left unit via wheelchair with all belongings at side.

## 2016-03-04 NOTE — Progress Notes (Signed)
**  Preliminary report by tech**  Carotid duplex completed. Findings are consistent with a 1-39 percent stenosis involving the right internal carotid artery and the left internal carotid artery. The vertebral arteries demonstrate antegrade flow.  03/04/16 11:41 AM Sean Leblanc RVT

## 2016-03-04 NOTE — Evaluation (Signed)
Physical Therapy Evaluation Patient Details Name: Sean Leblanc MRN: [id number] DOB: 04-Jul-1961 Today's Date: 03/04/2016   History of Present Illness  Patient is a 55 y/o male with HTN, tobacco abuse presents with rt sided weakness, slurred speech, and rt sided facial droop. CT and MRI-unremarkable. Workup pending.  Clinical Impression  Patient presents with elevated BP s/p above. However, pt had not been given BP medications prior to PT assessment. Pt reports all other symptoms have resolved. Tolerated gait training with higher level balance challenges without deviations in gait. Education on signs/symptoms of CVA. Pt has support from brother at home. Pt does not require skilled therapy services as pt functioning at baseline. Discharge from therapy.    Follow Up Recommendations No PT follow up    Equipment Recommendations  None recommended by PT    Recommendations for Other Services       Precautions / Restrictions Precautions Precautions: None Restrictions Weight Bearing Restrictions: No      Mobility  Bed Mobility Overal bed mobility: Independent                Transfers Overall transfer level: Independent               General transfer comment: No assist needed. No dizziness.  Ambulation/Gait Ambulation/Gait assistance: Independent Ambulation Distance (Feet): 350 Feet Assistive device: None Gait Pattern/deviations: WFL(Within Functional Limits)   Gait velocity interpretation: at or above normal speed for age/gender General Gait Details: No deficits noted during gait even with higher level balance challenges.  Stairs            Wheelchair Mobility    Modified Rankin (Stroke Patients Only) Modified Rankin (Stroke Patients Only) Pre-Morbid Rankin Score: No symptoms Modified Rankin: No symptoms     Balance Overall balance assessment: Needs assistance   Sitting balance-Leahy Scale: Normal     Standing balance support: During functional  activity Standing balance-Leahy Scale: Good    BP 148/93 EOB; 136/118 post ambulation           High level balance activites: Backward walking;Direction changes;Turns;Sudden stops;Head turns High Level Balance Comments: tolerated above without deviations in gait pattern. Standardized Balance Assessment Standardized Balance Assessment : Dynamic Gait Index   Dynamic Gait Index Level Surface: Normal Change in Gait Speed: Normal Gait with Horizontal Head Turns: Normal Gait with Vertical Head Turns: Normal Gait and Pivot Turn: Normal Step Over Obstacle: Normal Step Around Obstacles: Normal       Pertinent Vitals/Pain Pain Assessment: No/denies pain    Home Living Family/patient expects to be discharged to:: Private residence Living Arrangements: Other relatives (brother) Available Help at Discharge: Family;Available 24 hours/day Type of Home: Apartment Home Access: Level entry     Home Layout: One level Home Equipment: Cane - single point      Prior Function Level of Independence: Independent         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 Does not drive.      Hand Dominance        Extremity/Trunk Assessment   Upper Extremity Assessment: Defer to OT evaluation;Overall WFL for tasks assessed           Lower Extremity Assessment: Overall WFL for tasks assessed         Communication   Communication: No difficulties  Cognition Arousal/Alertness: Awake/alert Behavior During Therapy: WFL for tasks assessed/performed Overall Cognitive Status: Within Functional Limits for tasks assessed  General Comments      Exercises        Assessment/Plan    PT Assessment Patent does not need any further PT services  PT Diagnosis Difficulty walking   PT Problem List    PT Treatment Interventions     PT Goals (Current goals can be found in the Care Plan section) Acute Rehab PT Goals Patient Stated Goal: to get back to  work PT Goal Formulation: All assessment and education complete, DC therapy    Frequency     Barriers to discharge        Co-evaluation               End of Session Equipment Utilized During Treatment: Gait belt Activity Tolerance: Patient tolerated treatment well Patient left: in bed;with call bell/phone within reach Nurse Communication: Mobility status;Other (comment) (can be signed off as independent.)         Time: [id number] PT Time Calculation (min) (ACUTE ONLY): 16 min   Charges:   PT Evaluation $PT Eval Low Complexity: 1 Procedure     PT G Codes:        Landen Knoedler A Evonte Prestage 03/04/2016, 8:31 AM Wray Kearns, PT, DPT [phone]

## 2016-03-04 NOTE — Progress Notes (Signed)
OT Cancellation Note  Patient Details Name: Sean Leblanc MRN: [id number] DOB: [birth year]   Cancelled Treatment:    Reason Eval/Treat Not Completed: OT screened, no needs identified, will sign off. Per PT, pt independent with mobility and all symptoms have resolved. No acute OT needs identified; will sign off at this time. Please re-consult if needs change. Thank you for this referral.  Binnie Kand M.S., OTR/L Pager: [phone]  03/04/2016, 1:39 PM

## 2016-03-04 NOTE — Progress Notes (Signed)
STROKE TEAM PROGRESS NOTE   HISTORY OF PRESENT ILLNESS (per record) Sean Leblanc is an 55 y.o. male who presented with onset of aphasia, slurred speech, right facial droop, and right upper extremity weakness that was noted at 7 AM on 03/03/2106 ([facility]). EMS was contacted and the patient was brought to the emergency room. Upon arrival in the emergency room Sean Leblanc had some mild word finding difficulties. He was observed to have a notable right facial droop. He had a slight right pronator drift. He appeared to have some minimal weakness of his right lower extremity.  Sean Leblanc was taken to CT where his imaging studies were normal. A CTA was contemplated. However, he was reexamined immediately after the CT and his symptoms had almost completely resolved. He had a minimal pronator drift which was quite subtle. His facial droop had resolved. His speech was back to normal.  Patient was not administered IV t-PA secondary to resolved deficits. He was admitted for further evaluation and treatment.   SUBJECTIVE (INTERVAL HISTORY) His teaching service team is at the bedside.  Patient is sitting up in the chair at the bedside.    OBJECTIVE Temp:  [97.5 F (36.4 C)-98.9 F (37.2 C)] 97.9 F (36.6 C) (08/25 0900) Pulse Rate:  [44-56] 49 (08/25 0900) Cardiac Rhythm: Sinus bradycardia (08/25 0700) Resp:  [12-22] 18 (08/25 0900) BP: (146-198)/(79-133) 154/99 (08/25 0900) SpO2:  [98 %-100 %] 100 % (08/25 0900) Weight:  [68 kg (149 lb 14.6 oz)-68.9 kg (152 lb)] 68 kg (149 lb 14.6 oz) (08/24 1820)  CBC:   Recent Labs Lab 03/03/16 0748 03/03/16 0753  WBC 6.3  --   NEUTROABS 3.2  --   HGB 14.1 15.6  HCT 43.1 46.0  MCV 89.2  --   PLT 259  --     Basic Metabolic Panel:   Recent Labs Lab 03/03/16 0748 03/03/16 0753  NA 136 141  K 3.9 3.9  CL 106 105  CO2 23  --   GLUCOSE 105* 102*  BUN 14 16  CREATININE 0.87 0.80  CALCIUM 9.3  --     Lipid Panel:     Component Value Date/Time   CHOL  166 03/04/2016 0046   TRIG 113 03/04/2016 0046   HDL 44 03/04/2016 0046   CHOLHDL 3.8 03/04/2016 0046   VLDL 23 03/04/2016 0046   LDLCALC 99 03/04/2016 0046   HgbA1c: No results found for: HGBA1C Urine Drug Screen:     Component Value Date/Time   LABOPIA NONE DETECTED 03/03/2016 1257   COCAINSCRNUR NONE DETECTED 03/03/2016 1257   LABBENZ NONE DETECTED 03/03/2016 1257   AMPHETMU NONE DETECTED 03/03/2016 1257   THCU NONE DETECTED 03/03/2016 1257   LABBARB NONE DETECTED 03/03/2016 1257      IMAGING  Mr Brain Wo Contrast  Result Date: 03/03/2016 CLINICAL DATA:  Right facial droop and right body numbness. History of hypertension and tobacco abuse. EXAM: MRI HEAD WITHOUT CONTRAST MRA HEAD WITHOUT CONTRAST TECHNIQUE: Multiplanar, multiecho pulse sequences of the brain and surrounding structures were obtained without intravenous contrast. Angiographic images of the head were obtained using MRA technique without contrast. COMPARISON:  CT 03/03/2016 FINDINGS: MRI HEAD FINDINGS Mild atrophy for age.  Negative for hydrocephalus Extensive periventricular and deep white matter white matter disease. Numerous confluent and patchy areas of hyperintensity throughout the cerebral white matter widely distributed and most consistent with microvascular ischemia, advanced for age. 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 Brainstem and cerebellum normal. 1 cm area of hyperintensity  in the right parietal white matter on diffusion most likely is T2 shine through. No definite area of acute infarct. Negative for mass or edema.  No shift of the midline structures. Negative for intracranial hemorrhage. Normal orbital structures. Paranasal sinuses clear. Pituitary normal in size. MRA HEAD FINDINGS Hypoplastic basilar due to fetal origin of the posterior cerebral arteries bilaterally. Both vertebral arteries are small and contribute to the basilar. PICA patent bilaterally. Basilar patent to the superior cerebellar  arteries bilaterally. Posterior cerebral arteries both supplied via the carotid artery with mild atherosclerotic disease in the posterior cerebral artery bilaterally. Cavernous carotid widely patent bilaterally. No significant stenosis. Anterior and middle cerebral arteries patent bilaterally without stenosis. Negative for cerebral aneurysm. IMPRESSION: Extensive chronic white matter changes most consistent with microvascular ischemia due to poorly controlled hypertension. Negative for acute infarct. Mild atherosclerotic disease in the posterior cerebral arteries bilaterally. These are both supplied by the internal carotid artery bilaterally. Anterior circulation intact. No large vessel occlusion. Electronically Signed   By: Franchot Gallo M.D.   On: 03/03/2016 13:58   Mr Jodene Nam Head/brain [id number] Cm  Result Date: 03/03/2016 CLINICAL DATA:  Right facial droop and right body numbness. History of hypertension and tobacco abuse. EXAM: MRI HEAD WITHOUT CONTRAST MRA HEAD WITHOUT CONTRAST TECHNIQUE: Multiplanar, multiecho pulse sequences of the brain and surrounding structures were obtained without intravenous contrast. Angiographic images of the head were obtained using MRA technique without contrast. COMPARISON:  CT 03/03/2016 FINDINGS: MRI HEAD FINDINGS Mild atrophy for age.  Negative for hydrocephalus Extensive periventricular and deep white matter white matter disease. Numerous confluent and patchy areas of hyperintensity throughout the cerebral white matter widely distributed and most consistent with microvascular ischemia, advanced for age. 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 Brainstem and cerebellum normal. 1 cm area of hyperintensity in the right parietal white matter on diffusion most likely is T2 shine through. No definite area of acute infarct. Negative for mass or edema.  No shift of the midline structures. Negative for intracranial hemorrhage. Normal orbital structures. Paranasal sinuses clear.  Pituitary normal in size. MRA HEAD FINDINGS Hypoplastic basilar due to fetal origin of the posterior cerebral arteries bilaterally. Both vertebral arteries are small and contribute to the basilar. PICA patent bilaterally. Basilar patent to the superior cerebellar arteries bilaterally. Posterior cerebral arteries both supplied via the carotid artery with mild atherosclerotic disease in the posterior cerebral artery bilaterally. Cavernous carotid widely patent bilaterally. No significant stenosis. Anterior and middle cerebral arteries patent bilaterally without stenosis. Negative for cerebral aneurysm. IMPRESSION: Extensive chronic white matter changes most consistent with microvascular ischemia due to poorly controlled hypertension. Negative for acute infarct. Mild atherosclerotic disease in the posterior cerebral arteries bilaterally. These are both supplied by the internal carotid artery bilaterally. Anterior circulation intact. No large vessel occlusion. Electronically Signed   By: Franchot Gallo M.D.   On: 03/03/2016 13:58   Ct Head Code Stroke W/o Cm  Addendum Date: 03/03/2016   ADDENDUM REPORT: 03/03/2016 08:18 ADDENDUM: Study discussed by telephone with Dr. Elson Clan 03/03/2016 at [phone]. Electronically Signed   By: Genevie Ann M.D.   On: 03/03/2016 08:18   Result Date: 03/03/2016 CLINICAL DATA:  Code stroke. 55 year old male with right side weakness and right facial droop. Initial encounter. EXAM: CT HEAD WITHOUT CONTRAST TECHNIQUE: Contiguous axial images were obtained from the base of the skull through the vertex without intravenous contrast. COMPARISON:  None. FINDINGS: Visible paranasal sinuses and mastoids are clear. No acute osseous abnormality identified. Visualized  orbits and scalp soft tissues are within normal limits. No acute intracranial hemorrhage identified. No midline shift, mass effect, or evidence of intracranial mass lesion. Patchy bilateral white matter hypodensity with anterior limb  internal capsule involvement in both hemispheres. Superimposed hypodensity in the left caudate nucleus. No cortically based acute infarct identified. No suspicious intracranial vascular hyperdensity. ASPECTS [facility] Stroke Program Early CT Score) Total score (0-10 with 10 being normal): 10 IMPRESSION: 1. 2. Evidence of chronic small vessel disease. No acute cortically based infarct or acute intracranial hemorrhage identified. 3. ASPECTS is 10. Electronically Signed: By: Genevie Ann M.D. On: 03/03/2016 07:56   Carotid Doppler   There is 1-39% bilateral ICA stenosis. Vertebral artery flow is antegrade.    2-D echocardiogram - Left ventricle: The cavity size was normal. There was severe concentric hypertrophy. Systolic function was moderately reduced. The estimated ejection fraction was in the range of 35% to 40%. Moderate diffuse hypokinesis with no identifiable regional variations. 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 - Left atrium: The atrium was mildly dilated. - Right atrium: The atrium was mildly dilated.   PHYSICAL EXAM Pleasant middle aged african [place] male not in distress. . Afebrile. Head is nontraumatic. Neck is supple without bruit.    Cardiac exam no murmur or gallop. Lungs are clear to auscultation. Distal pulses are well felt. Neurological Exam ;  Awake  Alert oriented x 3. Normal speech and language.eye movements full without nystagmus.fundi were not visualized. Vision acuity and fields appear normal. Hearing is normal. Palatal movements are normal. Face symmetric. Tongue midline. Normal strength, tone, reflexes and coordination. Normal sensation. Gait deferred. NIHSS 0 ASSESSMENT/PLAN Mr. NEON VANDERWALL is a 56 y.o. male with history of HTN and tobacco abuse presenting with R sided weakness, R hemisensory tingling, R facial and slurred speech. He did not receive IV t-PA due to resolved symptoms.   L brain TIA  Patient is  enrollled in [place] is a randomized, double-blind, multicenter clinical trial to determine whether clopidogrel 75mg /day (after a loading dose of 600mg ) is effective in improving survival free from major ischemic vascular events (ischemic stroke, myocardial infarction, and ischemic vascular death) at 90 days when initiated within 12 hours time last known free of new ischemic symptoms of TIA or minor ischemic stroke in subjects receiving aspirin 50-325mg /day. Please contact [facility] at [phone] for any questions.  MRI  No acute stroke  MRA  No significant large vessel disease. Mild atherosclerotic disease  Carotid Doppler  No significant stenosis  2D Echo  EF 35-40%. Systolic function moderately reduced. No source of embolus seen.  LDL 99  HgbA1c pending  SCDs for VTE prophylaxis Diet Heart Room service appropriate? Yes; Fluid consistency: Thin  No antithrombotic prior to admission, now on aspirin 81 mg daily and POINT study drug  Patient counseled to be compliant with his antithrombotic medications  Ongoing aggressive stroke risk factor management  Therapy recommendations:  No PT, no OT  Disposition:  Return home  Hypertensive Emergency  BP as high as 208/114 in setting of neurologic symptoms  Permissive hypertension (OK if < 220/120) but gradually normalize in 5-7 days  Long-term BP goal normotensive  Other Stroke Risk Factors  Cigarette smoker, advised to stop smoking  ETOH use, advised to drink no more than 2 drink(s) a day  Other Active Problems  ST elevated on EKG  Hospital day # [facility]  See Amion for Pager information 03/04/2016 3:37 PM  I have personally examined this patient, reviewed notes, independently viewed imaging studies, participated in medical decision making and plan of care. I have made any additions or clarifications directly to the above note. Agree with note above.  The  patient presented with symptoms of left hemispheric TIA etiology likely small vessel disease. Continue ongoing stroke risk stratification evaluation. Patient is participating in the point trial for stroke prevention. Continue aspirin and the study medication at discharge . Greater than 50% time during this 35 minute visit was spent on counseling and coordination of care about stroke and TIA risk, prevention and treatment  Antony Contras, MD Medical Director [place] Pager: [phone] 03/04/2016 4:17 PM    To contact Stroke Continuity provider, please refer to [email or web address] After hours, contact General Neurology

## 2016-03-04 NOTE — Discharge Summary (Signed)
Name: Sean Leblanc MRN: [id number] DOB: 02-05-1961 55 y.o. PCP: No primary care provider on file.  Date of Admission: 03/03/2016  7:43 AM Date of Discharge: 03/04/2016 Attending Physician: Annia Belt, MD  Discharge Diagnosis: Principal Problem:   TIA (transient ischemic attack) Active Problems:   HTN (hypertension)   Tobacco abuse   HFrEF (heart failure with reduced ejection fraction) ([place])   Discharge Medications:   Medication List    STOP taking these medications   hydrochlorothiazide 25 MG tablet Commonly known as:  HYDRODIURIL   ibuprofen 200 MG tablet Commonly known as:  ADVIL,MOTRIN     TAKE these medications   aspirin 81 MG chewable tablet Chew 1 tablet (81 mg total) by mouth daily.   lisinopril 10 MG tablet Commonly known as:  PRINIVIL,ZESTRIL Take 1 tablet (10 mg total) by mouth daily. What changed:  medication strength  how much to take   research study medication Take 75 mg by mouth daily with breakfast.   research study medication Take 600 mg by mouth now.       Disposition and follow-up:   [name] was discharged from [facility] in Good condition.  At the hospital follow up visit please address:  1.  Hypertension: Recheck the patient's blood pressures. Titrate lisinopril up if needed. Consider adding diuretic and/or beta blocker for additional therapy in light of his reduced EF. Heart failure: Assess patient's weight, assess for symptoms of shortness of breath/lower extremity swelling, PND, orthopnea. Consider referral to cardiology. Assess patient's diet with regard to salt intake. TIA: Assess patient's compliant with aspirin, and POINT study medication. Assess for new neurologic symptoms. ASCVD Risk: Counsel on tobacco cessation, consider adding statin.  2.  Labs / imaging needed at time of follow-up: BMP  Follow-up Appointments: Follow-up Information    [place]. Go on  03/10/2016.   Why:  @9 :45 Contact information: [address] [place] [facility] Course by problem list: Principal Problem:   TIA (transient ischemic attack) Active Problems:   HTN (hypertension)   Tobacco abuse   Hypertensive crisis   1. TIA Patient presented with acute onset of word finding difficulties and hemiparesis. These difficulties resolved after around 30 minutes of arrival to the emergency department. He underwent head CT and brain MRI/MRA without any signs of acute hemorrhage or ischemic changes. He underwent typical stroke workup with echocardiogram and carotid Dopplers. Carotid Dopplers demonstrated some minor stenosis bilaterally. Echocardiogram demonstrated concentric hypertrophy with significantly reduced ejection fraction of 35-40%, without signs of septal defect. Neurology was consulted and he was started on aspirin. He was also enrolled in the POINT study. He was discharged with antihypertensive therapy for his hypertension and heart failure, as well as, secondary prophylaxis for further cerebral vascular events. He was also discharged on 81 mg of aspirin.  2. Hypertension Patient was found to be sniffily hypertensive on arrival to emergency department. She had LVH titrated on EKG. His hypertension appears to be long-standing and he has not been medicated for this in some time. His echocardiogram demonstrated significantly reduced ejection fraction with concentric hypertrophy likely related to his long-standing increase in blood pressure. He was discharged with lisinopril 10 mg was scheduled for follow-up in the [facility] for continued management. It is likely that he may need additional therapy for his blood pressure and we would recommend initiation of diuretic such as HCTZ  or beta blocker in light of his coexisting heart failure or just ejection fraction.  3. HFrEF: Newly diagnosed incidental finding on  echocardiogram during TIA workup. He was found to EF of 35-40%. Concentric hypertrophy. He is a symptomatically at this time shows no signs of hypervolemia. Denies shortness of breath leg swelling. He will be discharged with follow-up in internal medicine clinic and at that time f/u w/ cardiology could be considered. Lisinopril 10mg  as above.  Discharge Vitals:   BP (!) 144/92 (BP Location: Right Arm)   Pulse (!) 54   Temp 98.3 F (36.8 C) (Oral)   Resp 16   Ht 6\' 1"  (1.854 m)   Wt 149 lb 14.6 oz (68 kg)   SpO2 100%   BMI 19.78 kg/m   Pertinent Labs, Studies, and Procedures: As above.  Procedures Performed:  [name] Head/brain [id number] Cm  Result Date: 03/03/2016 CLINICAL DATA:  Right facial droop and right body numbness. History of hypertension and tobacco abuse. EXAM: MRI HEAD WITHOUT CONTRAST MRA HEAD WITHOUT CONTRAST TECHNIQUE: Multiplanar, multiecho pulse sequences of the brain and surrounding structures were obtained without intravenous contrast. Angiographic images of the head were obtained using MRA technique without contrast. COMPARISON:  CT 03/03/2016 FINDINGS: MRI HEAD FINDINGS Mild atrophy for age.  Negative for hydrocephalus Extensive periventricular and deep white matter white matter disease. Numerous confluent and patchy areas of hyperintensity throughout the cerebral white matter widely distributed and most consistent with microvascular ischemia, advanced for age. 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 Brainstem and cerebellum normal. 1 cm area of hyperintensity in the right parietal white matter on diffusion most likely is T2 shine through. No definite area of acute infarct. Negative for mass or edema.  No shift of the midline structures. Negative for intracranial hemorrhage. Normal orbital structures. Paranasal sinuses clear. Pituitary normal in size. MRA HEAD FINDINGS Hypoplastic basilar due to fetal origin of the posterior cerebral arteries bilaterally. Both vertebral arteries  are small and contribute to the basilar. PICA patent bilaterally. Basilar patent to the superior cerebellar arteries bilaterally. Posterior cerebral arteries both supplied via the carotid artery with mild atherosclerotic disease in the posterior cerebral artery bilaterally. Cavernous carotid widely patent bilaterally. No significant stenosis. Anterior and middle cerebral arteries patent bilaterally without stenosis. Negative for cerebral aneurysm. IMPRESSION: Extensive chronic white matter changes most consistent with microvascular ischemia due to poorly controlled hypertension. Negative for acute infarct. Mild atherosclerotic disease in the posterior cerebral arteries bilaterally. These are both supplied by the internal carotid artery bilaterally. Anterior circulation intact. No large vessel occlusion. Electronically Signed   By: Franchot Gallo M.D.   On: 03/03/2016 13:58   Ct Head Code Stroke W/o Cm  Result Date: 03/03/2016 CLINICAL DATA:  Code stroke. 55 year old male with right side weakness and right facial droop. Initial encounter. EXAM: CT HEAD WITHOUT CONTRAST TECHNIQUE: Contiguous axial images were obtained from the base of the skull through the vertex without intravenous contrast. COMPARISON:  None. FINDINGS: Visible paranasal sinuses and mastoids are clear. No acute osseous abnormality identified. Visualized orbits and scalp soft tissues are within normal limits. No acute intracranial hemorrhage identified. No midline shift, mass effect, or evidence of intracranial mass lesion. Patchy bilateral white matter hypodensity with anterior limb internal capsule involvement in both hemispheres. Superimposed hypodensity in the left caudate nucleus. No cortically based acute infarct identified. No suspicious intracranial vascular hyperdensity. ASPECTS [facility] Stroke Program Early CT Score) Total score (0-10 with 10 being normal): 10 IMPRESSION: 1. 2. Evidence of  chronic small vessel disease. No acute  cortically based infarct or acute intracranial hemorrhage identified. 3. ASPECTS is 10. Electronically Signed: By: Genevie Ann M.D. On: 03/03/2016 07:56   2D Echo:  Study Conclusions - Left ventricle: The cavity size was normal. There was severe   concentric hypertrophy. Systolic function was moderately reduced.   The estimated ejection fraction was in the range of 35% to 40%.   Moderate diffuse hypokinesis with no identifiable regional   variations. The left ventricle is heavily trabeculated, consider   a mild form of noncompaction syndrome. - Left atrium: The atrium was mildly dilated. - Right atrium: The atrium was mildly dilated.  Carotid Dopplers: Findings are consistent with a 1-39 percent stenosis involving the right internal carotid artery and the left internal carotid artery. The vertebral arteries demonstrate antegrade flow.  Consultations: Neurology  Discharge Instructions: Discharge Instructions    ([place]) Call MD:  Anytime you have any of the following symptoms: 1) 3 pound weight gain in 24 hours or 5 pounds in 1 week 2) shortness of breath, with or without a dry hacking cough 3) swelling in the hands, feet or stomach 4) if you have to sleep on extra pillows at night in order to breathe.    Complete by:  As directed   Call MD for:  difficulty breathing, headache or visual disturbances    Complete by:  As directed   Call MD for:  persistant dizziness or light-headedness    Complete by:  As directed   Call MD for:  persistant nausea and vomiting    Complete by:  As directed   Diet - low sodium heart healthy    Complete by:  As directed   Discharge instructions    Complete by:  As directed   We have found that your symptoms are due to a lack of blood flow to your brain. While these symptoms have resolved it is important to take her aspirin and the other medication involved in the POINT study in order to help prevent the symptoms from recurring. We have also found  that your blood pressure was significantly elevated. It'll be very important to control your blood pressure in order to prevent further episodes.  The echocardiogram study that you had done to look at her heart demonstrated that your heart is not pumping as well as it used to. This problem is known as heart failure. We will be very important to control your blood pressure in order to prevent this problem from getting worse it will also be important that you are very careful with your diet in terms of reducing the amount of salt that you eat. Continue to address this problem as an outpatient and may refer you to cardiology for continued management.  Prescribe a medication called lisinopril you'll take this medication one time daily (10 mg) and we may need to add further medications at your follow-up visit. It will be important that you very your appointment which we have scheduled for the [facility] clinic downstairs.  You will also follow up with neurology in about 3 months. According to will call to schedule this appointment.   Increase activity slowly    Complete by:  As directed     Signed: Holley Raring, MD 03/04/2016, 2:00 PM   Pager: [phone]

## 2016-03-04 NOTE — Progress Notes (Signed)
Subjective: Currently, the patient is feeling back to normal. No complaints.  Interval Events: Carotid dopplers w/ minimal stenosis. Echo w/ concentric hypertrophy and EF reduced to 35-40%.  Objective: Vital signs in last 24 hours: Vitals:   03/04/16 0001 03/04/16 0200 03/04/16 0400 03/04/16 0600  BP: (!) 154/82 (!) 157/95 (!) 146/88 (!) 155/97  Pulse: (!) 44   (!) 50  Resp:    18  Temp: 98.3 F (36.8 C) 98.1 F (36.7 C) 98 F (36.7 C) 98.9 F (37.2 C)  TempSrc: Oral Oral Oral Oral  SpO2: 100% 98% 100% 100%  Weight:      Height:       Intake/Output:  08/24 0701 - 08/25 0700 In: 560 [P.O.:560] Out: 200 [Urine:200]    Physical Exam: Physical Exam  Constitutional: No distress.  Cardiovascular: Normal rate, regular rhythm and normal heart sounds.  PMI is displaced.   Pulmonary/Chest: Effort normal and breath sounds normal.  Abdominal: Soft. Bowel sounds are normal. There is no tenderness.  Musculoskeletal: He exhibits no edema.   Labs: CBC:  Recent Labs Lab 03/03/16 0748 03/03/16 0753  WBC 6.3  --   NEUTROABS 3.2  --   HGB 14.1 15.6  HCT 43.1 46.0  MCV 89.2  --   PLT [id number]  --    Metabolic Panel:  Recent Labs Lab 03/03/16 0748 03/03/16 0753  NA 136 141  K 3.9 3.9  CL 106 105  CO2 23  --   GLUCOSE 105* 102*  BUN 14 16  CREATININE 0.87 0.80  CALCIUM 9.3  --   ALT 20  --   ALKPHOS 49  --   BILITOT 0.8  --   PROT 7.7  --   ALBUMIN 4.1  --   LABPROT 12.9  --   INR 0.97  --    Cardiac Labs:  Recent Labs Lab 03/03/16 0751 03/03/16 1149 03/03/16 1744 03/04/16 0042  TROPIPOC 0.00  --   --   --   TROPONINI  --  <0.03 <0.03 <0.03   BG:  Recent Labs Lab 03/03/16 0805  GLUCAP 51    Imaging: MRI Brain: Extensive chronic white matter changes most consistent with microvascular ischemia due to poorly controlled hypertension. Negative for acute infarct.  Mild atherosclerotic disease in the posterior cerebral arteries bilaterally. These are  both supplied by the internal carotid artery bilaterally. Anterior circulation intact. No large vessel occlusion.   Medications:   Scheduled Medications: . amLODipine  10 mg Oral Daily  . aspirin  81 mg Oral Daily  . research study medication  75 mg Oral Q breakfast  . research study medication  600 mg Oral NOW   Assessment/Plan: Pt is a 55 y.o. yo male with a PMHx of HTN and tobacco abuse who was admitted on 03/03/2016 with symptoms of dizziness and right parasthesia, which was determined to be secondary to TIA.  TIA-- R weakness, R facial droop, and slurred speech since resolved. CT/MRI/MRA negative for acute CVA. Likely TIA vs HTN encephalopathy, however BP appears chronically elevated. Lipids wnl. A1c at 15.6. - asa 81 daily - hgba1c, fasting lipid panel - ECHO, carotid dopplers - PT/OT eval  HTN-- likely chronically uncontrolled w/ LVH. Echo demonstrates concentric hypertrophy. - Switch Amlodipine 10mg  to Lisinopril 10mg   HFrEF: New diagnosis on echo. Asymptomatic w/o fluid overload. Will defer further w/u and management to outpt. Initiate antihypertensive therapy at discharge. - Lisinopril 10mg  - consider adding metoprolol 25mg  BID if additional BP control is needed. -  counsel on salt restriction  Length of Stay: 1 day(s) Dispo: Anticipated discharge today.  Holley Raring, MD Pager: [phone] (7AM-5PM) 03/04/2016, 6:55 AM

## 2016-03-04 NOTE — Progress Notes (Signed)
  Echocardiogram 2D Echocardiogram has been performed.  Jasier Calabretta 03/04/2016, 10:21 AM

## 2016-03-05 LAB — HEMOGLOBIN A1C
HEMOGLOBIN A1C: 5.1 % (ref 4.8–5.6)
MEAN PLASMA GLUCOSE: 100 mg/dL

## 2016-03-10 ENCOUNTER — Ambulatory Visit (INDEPENDENT_AMBULATORY_CARE_PROVIDER_SITE_OTHER): Payer: Self-pay | Admitting: Internal Medicine

## 2016-03-10 VITALS — BP 137/93 | HR 68 | Temp 98.4°F | Wt 145.6 lb

## 2016-03-10 DIAGNOSIS — E785 Hyperlipidemia, unspecified: Secondary | ICD-10-CM | POA: Insufficient documentation

## 2016-03-10 DIAGNOSIS — I5022 Chronic systolic (congestive) heart failure: Secondary | ICD-10-CM

## 2016-03-10 DIAGNOSIS — I10 Essential (primary) hypertension: Secondary | ICD-10-CM

## 2016-03-10 DIAGNOSIS — Z7982 Long term (current) use of aspirin: Secondary | ICD-10-CM

## 2016-03-10 DIAGNOSIS — Z79899 Other long term (current) drug therapy: Secondary | ICD-10-CM

## 2016-03-10 DIAGNOSIS — Z8673 Personal history of transient ischemic attack (TIA), and cerebral infarction without residual deficits: Secondary | ICD-10-CM

## 2016-03-10 DIAGNOSIS — F17210 Nicotine dependence, cigarettes, uncomplicated: Secondary | ICD-10-CM

## 2016-03-10 DIAGNOSIS — I110 Hypertensive heart disease with heart failure: Secondary | ICD-10-CM

## 2016-03-10 DIAGNOSIS — I5020 Unspecified systolic (congestive) heart failure: Secondary | ICD-10-CM

## 2016-03-10 MED ORDER — METOPROLOL TARTRATE 25 MG PO TABS: 25.0000 mg | ORAL_TABLET | Freq: Two times a day (BID) | ORAL | 1 refills | Status: DC

## 2016-03-10 MED ORDER — ATORVASTATIN CALCIUM 40 MG PO TABS: 40.0000 mg | ORAL_TABLET | Freq: Every day | ORAL | 1 refills | Status: DC

## 2016-03-10 NOTE — Assessment & Plan Note (Signed)
His ASCVD score 20%  Plan -start atorvastatin 40 mg daily

## 2016-03-10 NOTE — Assessment & Plan Note (Signed)
Pt was admitted for TIA< and found to be hypertensive. He has a history of hypertension and was on a medicine but was not on it for a while  A: HTN  Plan -lisinopril daily -started metoprolol 25 mg BID for his CHF

## 2016-03-10 NOTE — Progress Notes (Signed)
   CC: hospital follow up for newly diagnosed CHF, TIA episode, HLD, and HTN HPI: [name] is a 55 y.o. man with PMH noted below here for hospital follow up for newly diagnosed CHF, TIA episode, HLD, and HTN  Please see Problem List/A&P for the status of the patient's chronic medical problems   No past medical history on file.  Review of Systems: Denies fevers, fatigue, weight loss or weight gain Denies headaches,  Denies cough, SOB, or wheezing , orthopnea, or PND Denies weakness, tingling or numbness, or aphasia Denies n/v  Physical Exam: Vitals:   03/10/16 0951  BP: (!) 137/93  Pulse: 68  Temp: 98.4 F (36.9 C)  TempSrc: Oral  SpO2: 100%  Weight: 145 lb 9.6 oz (66 kg)    General: A&O, in NAD HEENT: EOMI,  PERRLA Neck: supple, midline trachea CV: RRR, normal s1, s2, no m/r/g, Resp: equal and symmetric breath sounds, no wheezing or crackles  Abdomen: soft, nontender, nondistended, +BS Extremities: no edema  Focused Neurologic exam: CN II-XII grossly intact DTRs: 2+ and symmetric. No hyperreflexia  Sensory: intact to light touch on both upper extremities  Motor: 5/5 strength in upper, 5/5 in lower extremities, normal muscle tone Cerebellar: gait normal    Assessment & Plan:   See encounters tab for problem based medical decision making. Patient discussed with Dr. Eppie Gibson

## 2016-03-10 NOTE — Assessment & Plan Note (Signed)
Patient was admitted in the hospital for a TIA episode, and underwent stroke workup, including echo, which showed reduced ejection fraction of 35-40%.  He denies symptoms of heart failure including orthopnea, PND, productive cough, edema, weight gain. He currently denies dyspnea on exertion and at rest. He can comfortably walk from his car to our clinic and he was not dyspneic.   Assessment: NYHA class I newly diagnosed chronic systolic heart failure with EF of 35-40%, currently euvolemic on exam.  Plan -Pt is already on lisinopril 10 mg daily -start metoprolol 25 mg BID -Refer to cardiology for ischemic workup for his newly diagnosed HFrEF -counseled on low salt intake and low fluid intake

## 2016-03-10 NOTE — Assessment & Plan Note (Signed)
Patient was admitted on 8/24 with acute aphasia , and RUE weakness , and his symptoms had resolved by the time he was admitted. His CT brain showed chronic ischemic changes and MRI/MRA, and carotid dopplers did not show any acute hemorrhage. He was started on lisinopril, aspirin 81 mg, and enrolled in the POINT trial.  TOday, he denies any residual symptoms and neurologic exam was entirely benign.  Plan -continue aspirin 81 mg daily -continue the POINT trial- he either gets plavix, or placebo and he does not know which medicine it is as it is a blinded trial -ASCVD 20% so we started him on atorvastatin 40 mg daily -follow up with neurology as scheduled

## 2016-03-10 NOTE — Patient Instructions (Signed)
Thank you for your visit today  Please start taking the metoprolol 25 mg twice a day  Please start taking the atorvastatin 40 mg daily  Please follow up with the heart doctor- someone will call you to make an appointment  Please follow up here in 4 weeks- please speak witgh ms Doris about a new PCP here.

## 2016-03-11 ENCOUNTER — Telehealth: Payer: Self-pay | Admitting: Internal Medicine

## 2016-03-11 NOTE — Progress Notes (Signed)
Case discussed with Dr. Saraiya at the time of the visit.  We reviewed the resident's history and exam and pertinent patient test results.  I agree with the assessment, diagnosis and plan of care documented in the resident's note. 

## 2016-03-11 NOTE — Telephone Encounter (Signed)
Called pt to schedule appointment for gccn application, no answer, no voice mail

## 2016-03-16 ENCOUNTER — Telehealth: Payer: Self-pay | Admitting: Internal Medicine

## 2016-03-16 NOTE — Telephone Encounter (Signed)
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

## 2016-03-29 ENCOUNTER — Encounter: Payer: Self-pay | Admitting: *Deleted

## 2016-04-19 ENCOUNTER — Encounter: Payer: Self-pay | Admitting: Internal Medicine

## 2016-04-19 ENCOUNTER — Ambulatory Visit: Payer: Self-pay

## 2016-04-19 ENCOUNTER — Ambulatory Visit (INDEPENDENT_AMBULATORY_CARE_PROVIDER_SITE_OTHER): Payer: Self-pay | Admitting: Internal Medicine

## 2016-04-19 DIAGNOSIS — E785 Hyperlipidemia, unspecified: Secondary | ICD-10-CM

## 2016-04-19 DIAGNOSIS — Z720 Tobacco use: Secondary | ICD-10-CM

## 2016-04-19 DIAGNOSIS — Z79899 Other long term (current) drug therapy: Secondary | ICD-10-CM

## 2016-04-19 DIAGNOSIS — Z7982 Long term (current) use of aspirin: Secondary | ICD-10-CM

## 2016-04-19 DIAGNOSIS — I110 Hypertensive heart disease with heart failure: Secondary | ICD-10-CM

## 2016-04-19 DIAGNOSIS — Z8673 Personal history of transient ischemic attack (TIA), and cerebral infarction without residual deficits: Secondary | ICD-10-CM

## 2016-04-19 DIAGNOSIS — F17210 Nicotine dependence, cigarettes, uncomplicated: Secondary | ICD-10-CM

## 2016-04-19 DIAGNOSIS — I5022 Chronic systolic (congestive) heart failure: Secondary | ICD-10-CM

## 2016-04-19 DIAGNOSIS — I5020 Unspecified systolic (congestive) heart failure: Secondary | ICD-10-CM

## 2016-04-19 DIAGNOSIS — I10 Essential (primary) hypertension: Secondary | ICD-10-CM

## 2016-04-19 DIAGNOSIS — Z0000 Encounter for general adult medical examination without abnormal findings: Secondary | ICD-10-CM | POA: Insufficient documentation

## 2016-04-19 MED ORDER — METOPROLOL TARTRATE 25 MG PO TABS: 25.0000 mg | ORAL_TABLET | Freq: Two times a day (BID) | ORAL | 1 refills | Status: DC

## 2016-04-19 MED ORDER — ATORVASTATIN CALCIUM 40 MG PO TABS: 40.0000 mg | ORAL_TABLET | Freq: Every day | ORAL | 1 refills | Status: DC

## 2016-04-19 MED ORDER — LISINOPRIL 10 MG PO TABS: 10.0000 mg | ORAL_TABLET | Freq: Every day | ORAL | 1 refills | Status: DC

## 2016-04-19 NOTE — Assessment & Plan Note (Signed)
A: During stroke workup echo found HF with reduced EF 35-40%. He denies dyspnea on exertion, cough, orthopnea, PND, or edema and does not have signs of volume overload on exam. He is NYHA class I without limitations in physical activity. The cause of this heart failure remains unclear and we need to rule out ischemic causes with cath. Our clinic is working on assisting him with insurance coverage at this time so we can set up referral to cardiology for this workup.   P: Refilled lisinopril 10 mg daily  Refilled Metoprolol 25 mg BID  Will continue to try to set him up with referral to cardiology  Follow up at our clinic in 3 months

## 2016-04-19 NOTE — Assessment & Plan Note (Signed)
A: He has a history of TIA and chronic microvascular ischemic changes on MRI. He denies episodes of aphasia, weakness, or neurologic deficit. He had a normal neurologic exam today.   P:  -continue aspirin 81 mg daily and POINT trial -Refilled atorvastatin 40 mg  - continued on blood pressure control with goal <130/90  - counseled on tobacco cessation

## 2016-04-19 NOTE — Progress Notes (Signed)
   CC: follow up of HTN for medication refill   HPI: [name] is a 55 y.o. with past medical history as outlined below who presents to clinic for follow up of HTN, HLD, and CHF. He has no new concerns or complaints today.   Please see problem list for status of the pt's chronic medical problems.  No past medical history on file.  Review of Systems:   Review of Systems  Constitutional: Negative for malaise/fatigue and weight loss.  Respiratory: Negative for cough and shortness of breath.   Cardiovascular: Negative for chest pain, orthopnea and leg swelling.  Neurological: Negative for dizziness and weakness.    Physical Exam:  Vitals:   04/19/16 1444  BP: (!) 161/99  Pulse: (!) 49  Temp: 98.7 F (37.1 C)  TempSrc: Oral  SpO2: 100%  Weight: 150 lb 1.6 oz (68.1 kg)   Physical Exam  Constitutional: He is oriented to person, place, and time. He appears well-developed and well-nourished.  HENT:  Right Ear: External ear normal.  Left Ear: External ear normal.  Eyes: EOM are normal. Pupils are equal, round, and reactive to light.  Neck: Neck supple. No thyromegaly present.  Cardiovascular: Normal rate and regular rhythm.   No murmur heard. Pulmonary/Chest: He has no wheezes. He has no rales.  Abdominal: Soft. He exhibits no distension. There is no tenderness.  Musculoskeletal: He exhibits no edema.  Lymphadenopathy:    He has no cervical adenopathy.  Neurological: He is alert and oriented to person, place, and time.  Skin: Skin is warm and dry.  Psychiatric: He has a normal mood and affect. His behavior is normal.    Assessment & Plan:   See Encounters Tab for problem based charting.   Patient seen with Dr. Eppie Gibson

## 2016-04-19 NOTE — Patient Instructions (Signed)
Sean Leblanc,   It was nice to meet you today, I look forward to working with you!  Please continue taking your medications the way that you have been taking them. Please call our clinic if you are feeling more tired than usual.   Please continue working with [place] to set up the orange insurance card.   Please complete the instructions for this stool screening card.   Thank you for bringing your medications with you today!

## 2016-04-19 NOTE — Assessment & Plan Note (Addendum)
A: Patient has not taken his home medication metoprolol for one day and lisinopril for the past two days. Today his BP was 161/99 initially and 150/96 on recheck. At his previous visit his BP was 137/93 and he was started on metoprolol 25 mg for CHF. Blood pressure goal is <130/90 for chronic microvascular changes related to hypertension found on MRI when he was hospitalized for TIA.   P:  Refilled Lisinopril 10 mg daily  Refilled Metoprolol 25 mg BID  It is difficult to assess whether this regiment is working to control his BP as he didn't have either today and was HTN this visit, will continue this regiment and see him back in 3 months for recheck.

## 2016-04-19 NOTE — Assessment & Plan Note (Signed)
A: He does express a desire to cut back on smoking today and says he has been trying to do this on his own. He has been smoking off and on since age 55 and now smokes about 7 cigarettes per day. He lives with his brother who quit smoking 10 years ago, but works with many people who smoke.   P: He is not interested in trying a nicotine patch at this time.  Goal is to cut back to no more than 6 cigarettes per day until next visit

## 2016-04-19 NOTE — Assessment & Plan Note (Signed)
A: He has been started on moderate-high intensity statin for prevention of further strokes. He denies muscle pain.   P: Refilled atorvastatin 40 mg daily

## 2016-04-25 ENCOUNTER — Telehealth: Payer: Self-pay | Admitting: Internal Medicine

## 2016-04-25 NOTE — Progress Notes (Signed)
I saw and evaluated the patient.  I personally confirmed the key portions of Dr. Blum's history and exam and reviewed pertinent patient test results.  The assessment, diagnosis, and plan were formulated together and I agree with the documentation in the resident's note. 

## 2016-04-25 NOTE — Telephone Encounter (Signed)
APT. REMINDER CALL, NO ANSWER, NO VOICEMAIL °

## 2016-04-26 ENCOUNTER — Telehealth: Payer: Self-pay | Admitting: Internal Medicine

## 2016-04-26 ENCOUNTER — Ambulatory Visit: Payer: Self-pay

## 2016-04-26 NOTE — Telephone Encounter (Signed)
APT. REMINDER CALL, NO ANSWER, NO VOICEMAIL °

## 2016-04-28 NOTE — Addendum Note (Signed)
Addended by: Hulan Fray on: 04/28/2016 07:48 PM   Modules accepted: Orders

## 2016-05-25 ENCOUNTER — Other Ambulatory Visit: Payer: Self-pay

## 2016-05-25 NOTE — Telephone Encounter (Signed)
metoprolol tartrate (LOPRESSOR) 25 MG tablet, refill request @ [facility] on [address].

## 2016-05-25 NOTE — Telephone Encounter (Signed)
Pt has refill at pharm.

## 2016-06-16 ENCOUNTER — Telehealth: Payer: Self-pay | Admitting: Internal Medicine

## 2016-06-16 NOTE — Telephone Encounter (Signed)
CALLED PT, NO ANSWER, NO VOICEMAIL, NEEDS TO COME IN AND DO APP FOR [facility]

## 2016-07-06 ENCOUNTER — Other Ambulatory Visit: Payer: Self-pay | Admitting: Internal Medicine

## 2016-07-12 ENCOUNTER — Other Ambulatory Visit: Payer: Self-pay

## 2016-07-15 ENCOUNTER — Telehealth: Payer: Self-pay | Admitting: Neurology

## 2016-07-15 NOTE — Telephone Encounter (Signed)
POINT STUDY END OF STUDY VISIT Patient was seen today for the end of study 90 day visit for the point trial. He states is doing well without recurrent stroke or TIA symptoms. He was compliant with taking his study medications and finished it at about a month ago. His had no recurrent stroke or TIA symptoms. He still continues to smoke but plans to quit. States his blood pressure is well controlled. It is 144/70 today.  NIH Stroke scale was 0.  Modified Rankin scale was 0.  Patient was advised to take aspirin 325 mg daily and maintain strict control of hypertension with goal below 130/90 and lipids with LDL cholesterol goal below 70 mg percent. He was also counseled to quit smoking completely. He was otherwise to return for follow-up in the clinic in 3 months or call earlier if necessary.  Antony Contras, MD  [facility] [address] [place], [place] [address]  Phone [phone] Fax [phone]

## 2016-08-01 ENCOUNTER — Telehealth: Payer: Self-pay | Admitting: Internal Medicine

## 2016-08-01 NOTE — Progress Notes (Signed)
   CC: shoulder pain   HPI: [name] is a 56 y.o. with past medical history of hypertension, systolic heart failure, hypertrophic cardiomyopathy, and hx of TIA who presents to clinic for follow up of hypertension, tobacco use disorder, health maintenance and mentions a new shoulder pain.  His left shoulder has been hurting for the last week. The pain is a throbbing sensation that is worse at the lateral border of his scapula and radiates around under his arm to his lateral left breast and feels more like a numb sensation there. The pain is worse when he lies down at night and he has a difficult time finding a comfortable way to sleep. He denies trauma to the area and it has not limited the heavy lifting that he does at work. He denies chest, jaw, or arm pain. The pain does not change with exertion. He has had some relief with arthritis cream but tylenol doesn't work.    Please see problem list for status of the pt's chronic medical problems.  No past medical history on file.  Review of Systems:  Please see each problem below for a pertinent review of systems.  Physical Exam:  Vitals:   08/02/16 1312  BP: (!) 156/92  Pulse: (!) 57  Temp: 98.2 F (36.8 C)  TempSrc: Oral  SpO2: 100%  Weight: 149 lb 1.6 oz (67.6 kg)   Physical Exam  Constitutional: He is oriented to person, place, and time. He appears well-developed and well-nourished. No distress.  Cardiovascular: Normal rate and regular rhythm.   No murmur heard. Pulmonary/Chest: Effort normal and breath sounds normal. No respiratory distress. He has no wheezes. He has no rales.  Abdominal: Soft. Bowel sounds are normal. He exhibits no distension. There is no tenderness.  Musculoskeletal:  Tenderness over the lateralborder of the scapula  ROM and strength intact and equal in both upper extremities  Neurological: He is alert and oriented to person, place, and time.  Skin: Skin is warm and dry. He is not diaphoretic.    Psychiatric: He has a normal mood and affect. His behavior is normal.    Assessment & Plan:   See Encounters Tab for problem based charting.   HTN  BP Readings from Last 3 Encounters:  08/02/16 (!) 156/92  04/19/16 (!) 161/99  03/10/16 (!) 137/93  BP remains elevated today. Says that he has been out of lisinopril for the past month and did not notify his pharmacy that he needed a new prescription. He is not at goal, will increase regiment and have him return to clinic in 1 month.   -Increase Lisinopril 20 mg qd (was previously taking 10 mg qd)  -Continue metoprolol 25 mg BID for CHF   tobacco abuse  Has cut back to using 4-5 cigarette per week. Offered nicotine patch, buprorion and chantix/ He says he will think about it and let me know but for now he would prefer to keep butting back on his own.   -Continue supervision of patient lead weaning of cigarette use     Reports that one of his medications cost $60, he is not sure which one it was. He is uninsured so I am sending all of his prescriptions to the [facility] pharmacy through the IM program.   Patient discussed with Dr. Evette Doffing

## 2016-08-01 NOTE — Telephone Encounter (Signed)
APT. REMINDER CALL, NO ANSWER, NO VOICEMAIL °

## 2016-08-02 ENCOUNTER — Ambulatory Visit (INDEPENDENT_AMBULATORY_CARE_PROVIDER_SITE_OTHER): Payer: Self-pay | Admitting: Internal Medicine

## 2016-08-02 ENCOUNTER — Encounter: Payer: Self-pay | Admitting: Internal Medicine

## 2016-08-02 DIAGNOSIS — F17210 Nicotine dependence, cigarettes, uncomplicated: Secondary | ICD-10-CM

## 2016-08-02 DIAGNOSIS — I10 Essential (primary) hypertension: Secondary | ICD-10-CM

## 2016-08-02 DIAGNOSIS — Z23 Encounter for immunization: Secondary | ICD-10-CM

## 2016-08-02 DIAGNOSIS — Z79899 Other long term (current) drug therapy: Secondary | ICD-10-CM

## 2016-08-02 DIAGNOSIS — M25512 Pain in left shoulder: Secondary | ICD-10-CM

## 2016-08-02 DIAGNOSIS — Z0000 Encounter for general adult medical examination without abnormal findings: Secondary | ICD-10-CM

## 2016-08-02 DIAGNOSIS — Z720 Tobacco use: Secondary | ICD-10-CM

## 2016-08-02 MED ORDER — ATORVASTATIN CALCIUM 40 MG PO TABS: 40.0000 mg | ORAL_TABLET | Freq: Every day | ORAL | 3 refills | Status: DC

## 2016-08-02 MED ORDER — METOPROLOL TARTRATE 25 MG PO TABS: 25.0000 mg | ORAL_TABLET | Freq: Two times a day (BID) | ORAL | 6 refills | Status: DC

## 2016-08-02 MED ORDER — LISINOPRIL 10 MG PO TABS: 20.0000 mg | ORAL_TABLET | Freq: Every day | ORAL | 3 refills | Status: DC

## 2016-08-02 MED FILL — LISINOPRIL 10 MG TABLET: 10 | 30 days supply | Qty: 60 | Fill #0

## 2016-08-02 MED FILL — ATORVASTATIN 40 MG TABLET: 40 | 30 days supply | Qty: 30 | Fill #0

## 2016-08-02 NOTE — Patient Instructions (Addendum)
It was a pleasure to see you today Sean Leblanc!   For your back pain - try taking aleve for one week, if your pain is not relieved please call the clinic and let us know.   For your high blood pressure - START taking Lisinopril 20 mg per day (you were previously on 10 mg per day)   Complete these stool cards and send them in when you are done.   Schedule a follow up appointment in one month

## 2016-08-04 DIAGNOSIS — M25512 Pain in left shoulder: Secondary | ICD-10-CM | POA: Insufficient documentation

## 2016-08-04 MED ORDER — METOPROLOL TARTRATE 25 MG PO TABS: 25.0000 mg | ORAL_TABLET | Freq: Two times a day (BID) | ORAL | 6 refills | Status: DC

## 2016-08-04 NOTE — Assessment & Plan Note (Signed)
Has cut back to using 4-5 cigarette per week from 6 cigarettes per day 3 months ago.   -Offered nicotine patch, buprorion and chantix/ He says he will think about it and let me know but for now he would prefer to keep butting back on his own.  -Continue supervision of patient lead weaning of cigarette use

## 2016-08-04 NOTE — Assessment & Plan Note (Addendum)
His left shoulder has been hurting for the last week. The pain is a throbbing sensation that is worse at the lateral border of his scapula and radiates around under his arm to his lateral left breast and feels more like a numb sensation there. The pain is worse when he lies down at night and he has a difficult time finding a comfortable way to sleep. He denies trauma to the area and it has not limited the heavy lifting that he does at work. He denies chest, jaw, or arm pain. The pain does not change with exertion. He has had some relief with arthritis cream but tylenol doesn't work.   On exam he has tenderness over the lateral border of his left scapula without limitation in left arm range of motion or strength. His shoulder pain may be related to muscle strain of the rotator cuff or tendinitis.   Trial of aleve for 1 week  Continue conservative management with arthritis cream  Will call if his pain is not improved in 1 week, can consider joint injection at that time

## 2016-08-04 NOTE — Assessment & Plan Note (Addendum)
FOBT cards given last visit but he did not get around to completing them, another set of cards will be sent to him today Flu shot today

## 2016-08-04 NOTE — Assessment & Plan Note (Addendum)
HTN  BP Readings from Last 3 Encounters:  08/02/16 (!) 156/92  04/19/16 (!) 161/99  03/10/16 (!) 137/93  BP remains elevated today. Says that he has been out of lisinopril for the past month and did not notify his pharmacy that he needed a new prescription. He is not at goal, will increase regiment.  Increase Lisinopril 20 mg qd (was previously taking 10 mg qd)  Continue metoprolol 25 mg BID for CHF  Return to clinic in 1 month for blood pressure check

## 2016-08-05 NOTE — Addendum Note (Signed)
Addended by: Lalla Brothers T on: 08/05/2016 10:44 AM   Modules accepted: Level of Service

## 2016-08-05 NOTE — Progress Notes (Signed)
Internal Medicine Clinic Attending  Case discussed with Dr. Blum at the time of the visit.  We reviewed the resident's history and exam and pertinent patient test results.  I agree with the assessment, diagnosis, and plan of care documented in the resident's note. 

## 2016-09-08 ENCOUNTER — Telehealth: Payer: Self-pay | Admitting: Internal Medicine

## 2016-09-08 NOTE — Telephone Encounter (Signed)
CALLED PT, NO ANSWER, NO VOICEMAIL, HE NEEDS TO APP FOR GCCN CARD FOR PENDING REFERRAL

## 2016-09-12 ENCOUNTER — Telehealth: Payer: Self-pay | Admitting: Internal Medicine

## 2016-09-12 NOTE — Telephone Encounter (Signed)
APT. REMINDER CALL, LMTCB °

## 2016-09-13 ENCOUNTER — Ambulatory Visit: Payer: Self-pay

## 2016-09-15 MED FILL — LISINOPRIL 10 MG TABLET: 10 | 30 days supply | Qty: 60 | Fill #1

## 2016-09-29 ENCOUNTER — Telehealth: Payer: Self-pay | Admitting: Internal Medicine

## 2016-09-29 NOTE — Telephone Encounter (Signed)
CALLED PATIENT NO ANSWER AND NO VOICEMAIL, HE WAS A NO SHOW ON 09/13/16 TO DO NEW APP FOR GCCN CARD.

## 2016-10-04 MED FILL — METOPROLOL TARTRATE 25 MG T: 25 | 30 days supply | Qty: 60 | Fill #0

## 2016-10-26 MED FILL — ATORVASTATIN 40 MG TABLET: 40 | 30 days supply | Qty: 30 | Fill #1

## 2016-10-26 MED FILL — LISINOPRIL 10 MG TABLET: 10 | 30 days supply | Qty: 60 | Fill #2

## 2016-11-16 MED FILL — METOPROLOL TARTRATE 25 MG T: 25 | 30 days supply | Qty: 60 | Fill #1

## 2016-11-30 MED FILL — ATORVASTATIN 40 MG TABLET: 40 | 30 days supply | Qty: 30 | Fill #2

## 2016-11-30 MED FILL — LISINOPRIL 10 MG TABLET: 10 | 30 days supply | Qty: 60 | Fill #3

## 2016-12-21 MED FILL — METOPROLOL TARTRATE 25 MG T: 25 | 30 days supply | Qty: 60 | Fill #2

## 2017-01-05 MED FILL — ATORVASTATIN 40 MG TABLET: 40 | 30 days supply | Qty: 30 | Fill #3

## 2017-01-06 ENCOUNTER — Other Ambulatory Visit: Payer: Self-pay | Admitting: *Deleted

## 2017-01-06 MED ORDER — LISINOPRIL 20 MG PO TABS: 20.0000 mg | ORAL_TABLET | Freq: Every day | ORAL | 5 refills | Status: DC

## 2017-01-06 MED FILL — LISINOPRIL 20 MG TABLET: 20 | 30 days supply | Qty: 30 | Fill #0

## 2017-01-25 MED FILL — METOPROLOL TARTRATE 25 MG T: 25 | 30 days supply | Qty: 60 | Fill #3

## 2017-02-15 MED FILL — LISINOPRIL 20 MG TAB: 20 | 30 days supply | Qty: 30 | Fill #1

## 2017-02-15 MED FILL — ATORVASTATIN 40 MG TABLET: 40 | 30 days supply | Qty: 30 | Fill #4

## 2017-03-08 MED FILL — METOPROLOL TARTRATE 25 MG T: 25 | 30 days supply | Qty: 60 | Fill #4

## 2017-03-22 MED FILL — ATORVASTATIN 40 MG TABLET: 40 | 30 days supply | Qty: 30 | Fill #5

## 2017-03-22 MED FILL — LISINOPRIL 20 MG TABS: 20 | 30 days supply | Qty: 30 | Fill #2

## 2017-04-24 ENCOUNTER — Encounter: Payer: Self-pay | Admitting: Internal Medicine

## 2017-04-24 NOTE — Progress Notes (Signed)
   CC: follow up of hypertension   HPI:  [name] is a 56 y.o. with PMH hypertension, tobacco abuse, chronic systolic heart failure (10/4816 EF 35-40%), history of TIA, and hyperlipidemia who presents for follow up of hypertension and heart failure. Please see the assessment and plans for the status of the patient chronic medical problems.   Past Medical History:  Diagnosis Date  . TIA (transient ischemic attack)    Review of Systems: Refer to history of present illness and assessment and plans for pertinent review of systems, all others reviewed and negative  Physical Exam:  Vitals:   04/25/17 1618 04/25/17 1721  BP: (!) 179/102 (!) 182/112  Pulse: (!) 59 62  Temp: 98.1 F (36.7 C)   TempSrc: Oral   SpO2: 100%   Weight: 144 lb 3.2 oz (65.4 kg)    General: thin well-appearing man Cardiac: Regular rate and rhythm, no appreciable murmurs, no JVD, no peripheral edema Pulmonary: lungs clear to auscultation bilateral Abdomen: Soft, nontender, nondistended  Assessment & Plan:   HTN  BP Readings from Last 3 Encounters:  04/25/17 (!) 182/112  08/02/16 (!) 156/92  04/19/16 (!) 161/99  Blood pressure is not controlled today and far from goal < 130/80. Patient denies headaches or blurry vision. We discussed my concern for this high blood pressure. He will pick up new blood pressure medications now and start them this evening. - Increased lisinopril to 40 mg qd - prescribed HCTZ 12.5 mg qd  - continue lopressor 25 mg BID  - encouraged tobacco cessation - follow up in 1 month   Tobacco abuse  Patient reportedly smokes about 3 cigarettes per day. He does not feel the need to cut back at this time. Has not tried patches or gum in the past. - encouraged tobacco cessation  Chronic systolic congestive heart failure  Patient's been diagnosed both systolic heart failure and found to have hypokinesia on echo last year. He's not yet completed ischemic workup. He was referred to  cardiology last year however he could not afford to go at this time. Now he has insurance and is ready to see cardiology. No signs of volume overload on my exam today. - Referral to cardiology - continue Lopressor 25 mg twice a day, his heart rate is 59 today so this may be the maximum tolerated dose - continue aspirin and atorvastatin - encouraged tobacco cessation  Healthcare maintenance -HIV and hep C screen today -Flu vaccination today -Due for colon cancer screening, no family history of colon cancer, denies bloody stool > FOBT screening   See Encounters Tab for problem based charting.  Patient discussed with Dr. Lynnae January

## 2017-04-25 ENCOUNTER — Ambulatory Visit (INDEPENDENT_AMBULATORY_CARE_PROVIDER_SITE_OTHER): Payer: No Typology Code available for payment source | Admitting: Internal Medicine

## 2017-04-25 VITALS — BP 182/112 | HR 62 | Temp 98.1°F | Wt 144.2 lb

## 2017-04-25 DIAGNOSIS — Z0000 Encounter for general adult medical examination without abnormal findings: Secondary | ICD-10-CM

## 2017-04-25 DIAGNOSIS — E785 Hyperlipidemia, unspecified: Secondary | ICD-10-CM | POA: Diagnosis not present

## 2017-04-25 DIAGNOSIS — I5189 Other ill-defined heart diseases: Secondary | ICD-10-CM

## 2017-04-25 DIAGNOSIS — F17210 Nicotine dependence, cigarettes, uncomplicated: Secondary | ICD-10-CM | POA: Diagnosis not present

## 2017-04-25 DIAGNOSIS — Z23 Encounter for immunization: Secondary | ICD-10-CM

## 2017-04-25 DIAGNOSIS — Z79899 Other long term (current) drug therapy: Secondary | ICD-10-CM | POA: Diagnosis not present

## 2017-04-25 DIAGNOSIS — Z8673 Personal history of transient ischemic attack (TIA), and cerebral infarction without residual deficits: Secondary | ICD-10-CM

## 2017-04-25 DIAGNOSIS — I5022 Chronic systolic (congestive) heart failure: Secondary | ICD-10-CM | POA: Diagnosis not present

## 2017-04-25 DIAGNOSIS — Z114 Encounter for screening for human immunodeficiency virus [HIV]: Secondary | ICD-10-CM | POA: Diagnosis not present

## 2017-04-25 DIAGNOSIS — Z1159 Encounter for screening for other viral diseases: Secondary | ICD-10-CM

## 2017-04-25 DIAGNOSIS — Z7982 Long term (current) use of aspirin: Secondary | ICD-10-CM

## 2017-04-25 DIAGNOSIS — I10 Essential (primary) hypertension: Secondary | ICD-10-CM

## 2017-04-25 DIAGNOSIS — I110 Hypertensive heart disease with heart failure: Secondary | ICD-10-CM | POA: Diagnosis not present

## 2017-04-25 DIAGNOSIS — Z1211 Encounter for screening for malignant neoplasm of colon: Secondary | ICD-10-CM | POA: Insufficient documentation

## 2017-04-25 DIAGNOSIS — Z720 Tobacco use: Secondary | ICD-10-CM

## 2017-04-25 MED ORDER — HYDROCHLOROTHIAZIDE 12.5 MG PO TABS: 12.5000 mg | ORAL_TABLET | Freq: Every day | ORAL | 4 refills | Status: DC

## 2017-04-25 MED ORDER — ATORVASTATIN CALCIUM 40 MG PO TABS: 40.0000 mg | ORAL_TABLET | Freq: Every day | ORAL | 4 refills | Status: DC

## 2017-04-25 MED ORDER — LISINOPRIL 40 MG PO TABS: 40.0000 mg | ORAL_TABLET | Freq: Every day | ORAL | 4 refills | Status: DC

## 2017-04-25 NOTE — Assessment & Plan Note (Signed)
BP Readings from Last 3 Encounters:  04/25/17 (!) 182/112  08/02/16 (!) 156/92  04/19/16 (!) 161/99  Blood pressure is not controlled today and far from goal < 130/80. Patient denies headaches or blurry vision. We discussed my concern for this high blood pressure. He will pick up new blood pressure medications now and start them this evening. - Increased lisinopril to 40 mg qd - prescribed HCTZ 12.5 mg qd  - continue lopressor 25 mg BID  - encouraged tobacco cessation

## 2017-04-25 NOTE — Assessment & Plan Note (Signed)
Patient's been diagnosed both systolic heart failure and found to have hypokinesia on echo last year. He's not yet completed ischemic workup. He was referred to cardiology last year however he could not afford to go at this time. Now he has insurance and is ready to see cardiology. No signs of volume overload on my exam today. - Referral to cardiology - continue Lopressor 25 mg twice a day, his heart rate is 59 today so this may be the maximum tolerated dose - continue aspirin and atorvastatin - encouraged tobacco cessation

## 2017-04-25 NOTE — Patient Instructions (Signed)
It was a pleasure to see you today Sean Leblanc,  - For your high blood pressure, start taking lisinopril 40 mg daily (he were previously taking 20 mg daily) and hydrochlorothiazide 12.5 mg daily and lopressor 25 mg BID  - I'm sending you to a heart doctor to follow-up on your heart failure.   - schedule a follow-up appointment in one month - Please call our clinic if you have any problems or questions, we may be able to help you and keep you from a long emergency room wait. Our clinic and after hours phone number is [phone]       Mediterranean Diet  Why follow it? Research shows. . Those who follow the Mediterranean diet have a reduced risk of heart disease  . The diet is associated with a reduced incidence of Parkinson's and Alzheimer's diseases . People following the diet may have longer life expectancies and lower rates of chronic diseases  . The Dietary Guidelines for Americans recommends the Mediterranean diet as an eating plan to promote health and prevent disease  What Is the Mediterranean Diet?  . Healthy eating plan based on typical foods and recipes of Mediterranean-style cooking . The diet is primarily a plant based diet; these foods should make up a majority of meals   Starches - Plant based foods should make up a majority of meals - They are an important sources of vitamins, minerals, energy, antioxidants, and fiber - Choose whole grains, foods high in fiber and minimally processed items  - Typical grain sources include wheat, oats, barley, corn, brown rice, bulgar, farro, millet, polenta, couscous  - Various types of beans include chickpeas, lentils, fava beans, black beans, white beans   Fruits  Veggies - Large quantities of antioxidant rich fruits & veggies; 6 or more servings  - Vegetables can be eaten raw or lightly drizzled with oil and cooked  - Vegetables common to the traditional Mediterranean Diet include: artichokes, arugula, beets, broccoli, brussel sprouts,  cabbage, carrots, celery, collard greens, cucumbers, eggplant, kale, leeks, lemons, lettuce, mushrooms, okra, onions, peas, peppers, potatoes, pumpkin, radishes, rutabaga, shallots, spinach, sweet potatoes, turnips, zucchini - Fruits common to the Mediterranean Diet include: apples, apricots, avocados, cherries, clementines, dates, figs, grapefruits, grapes, melons, nectarines, oranges, peaches, pears, pomegranates, strawberries, tangerines  Fats - Replace butter and margarine with healthy oils, such as olive oil, canola oil, and tahini  - Limit nuts to no more than a handful a day  - Nuts include walnuts, almonds, pecans, pistachios, pine nuts  - Limit or avoid candied, honey roasted or heavily salted nuts - Olives are central to the [employer] - can be eaten whole or used in a variety of dishes   Meats Protein - Limiting red meat: no more than a few times a month - When eating red meat: choose lean cuts and keep the portion to the size of deck of cards - Eggs: approx. 0 to 4 times a week  - Fish and lean poultry: at least 2 a week  - Healthy protein sources include, chicken, [place], lean beef, lamb - Increase intake of seafood such as tuna, salmon, trout, mackerel, shrimp, scallops - Avoid or limit high fat processed meats such as sausage and bacon  Dairy - Include moderate amounts of low fat dairy products  - Focus on healthy dairy such as fat free yogurt, skim milk, low or reduced fat cheese - Limit dairy products higher in fat such as whole or 2% milk, cheese, ice cream  Alcohol - Moderate amounts of red wine is ok  - No more than 5 oz daily for women (all ages) and men older than age 67  - No more than 10 oz of wine daily for men younger than 70  Other - Limit sweets and other desserts  - Use herbs and spices instead of salt to flavor foods  - Herbs and spices common to the traditional Mediterranean Diet include: basil, bay leaves, chives, cloves, cumin, fennel, garlic, lavender,  marjoram, mint, oregano, parsley, pepper, rosemary, sage, savory, sumac, tarragon, thyme   It's not just a diet, it's a lifestyle:  . The Mediterranean diet includes lifestyle factors typical of those in the region  . Foods, drinks and meals are best eaten with others and savored . Daily physical activity is important for overall good health . This could be strenuous exercise like running and aerobics . This could also be more leisurely activities such as walking, housework, yard-work, or taking the stairs . Moderation is the key; a balanced and healthy diet accommodates most foods and drinks . Consider portion sizes and frequency of consumption of certain foods   Meal Ideas & Options:  . Breakfast:  o Whole wheat toast or whole wheat English muffins with peanut butter & hard boiled egg o Steel cut oats topped with apples & cinnamon and skim milk  o Fresh fruit: banana, strawberries, melon, berries, peaches  o Smoothies: strawberries, bananas, greek yogurt, peanut butter o Low fat greek yogurt with blueberries and granola  o Egg white omelet with spinach and mushrooms o Breakfast couscous: whole wheat couscous, apricots, skim milk, cranberries  . Sandwiches:  o Hummus and grilled vegetables (peppers, zucchini, squash) on whole wheat bread   o Grilled chicken on whole wheat pita with lettuce, tomatoes, cucumbers or tzatziki  o Tuna salad on whole wheat bread: tuna salad made with greek yogurt, olives, red peppers, capers, green onions o Garlic rosemary lamb pita: lamb sauted with garlic, rosemary, salt & pepper; add lettuce, cucumber, greek yogurt to pita - flavor with lemon juice and black pepper  . Seafood:  o Mediterranean grilled salmon, seasoned with garlic, basil, parsley, lemon juice and black pepper o Shrimp, lemon, and spinach whole-grain pasta salad made with low fat greek yogurt  o Seared scallops with lemon orzo  o Seared tuna steaks seasoned salt, pepper, coriander topped  with tomato mixture of olives, tomatoes, olive oil, minced garlic, parsley, green onions and cappers  . Meats:  o Herbed greek chicken salad with kalamata olives, cucumber, feta  o Red bell peppers stuffed with spinach, bulgur, lean ground beef (or lentils) & topped with feta   o Kebabs: skewers of chicken, tomatoes, onions, zucchini, squash  o [place] burgers: made with red onions, mint, dill, lemon juice, feta cheese topped with roasted red peppers . Vegetarian o Cucumber salad: cucumbers, artichoke hearts, celery, red onion, feta cheese, tossed in olive oil & lemon juice  o Hummus and whole grain pita points with a greek salad (lettuce, tomato, feta, olives, cucumbers, red onion) o Lentil soup with celery, carrots made with vegetable broth, garlic, salt and pepper  o Tabouli salad: parsley, bulgur, mint, scallions, cucumbers, tomato, radishes, lemon juice, olive oil, salt and pepper.

## 2017-04-25 NOTE — Assessment & Plan Note (Signed)
Patient reportedly smokes about 3 cigarettes per day. He does not feel the need to cut back at this time. Has not tried patches or gum in the past. - encouraged tobacco cessation

## 2017-04-25 NOTE — Assessment & Plan Note (Signed)
-  HIV and hep C screen today -Flu vaccination today -Due for colon cancer screening, no family history of colon cancer, denies bloody stool > FOBT screening

## 2017-04-26 LAB — BMP8+ANION GAP
Anion Gap: 15 mmol/L (ref 10.0–18.0)
BUN/Creatinine Ratio: 11 (ref 9–20)
BUN: 10 mg/dL (ref 6–24)
CO2: 24 mmol/L (ref 20–29)
CREATININE: 0.89 mg/dL (ref 0.76–1.27)
Calcium: 9.8 mg/dL (ref 8.7–10.2)
Chloride: 101 mmol/L (ref 96–106)
GFR calc non Af Amer: 96 mL/min/{1.73_m2} (ref >59)
GFR, EST AFRICAN AMERICAN: 110 mL/min/{1.73_m2} (ref >59)
Glucose: 76 mg/dL (ref 65–99)
Potassium: 4.2 mmol/L (ref 3.5–5.2)
Sodium: 140 mmol/L (ref 134–144)

## 2017-04-26 LAB — HIV ANTIBODY (ROUTINE TESTING W REFLEX): HIV Screen 4th Generation wRfx: NONREACTIVE

## 2017-04-26 LAB — HEPATITIS C ANTIBODY: Hep C Virus Ab: 0.2 s/co ratio (ref 0.0–0.9)

## 2017-04-26 MED FILL — LISINOPRIL 20 MG TABS: 20 | 30 days supply | Qty: 30 | Fill #3

## 2017-04-26 MED FILL — ATORVASTATIN 40 MG TABLET: 40 | 30 days supply | Qty: 30 | Fill #6

## 2017-04-26 MED FILL — METOPROLOL TARTRATE 25 MG T: 25 | 30 days supply | Qty: 60 | Fill #5

## 2017-04-27 NOTE — Addendum Note (Signed)
Addended by: Meryl Dare on: 04/27/2017 11:29 AM   Modules accepted: Orders

## 2017-04-28 NOTE — Progress Notes (Signed)
Internal Medicine Clinic Attending  Case discussed with Dr. Blum at the time of the visit.  We reviewed the resident's history and exam and pertinent patient test results.  I agree with the assessment, diagnosis, and plan of care documented in the resident's note. 

## 2017-05-03 ENCOUNTER — Encounter: Payer: Self-pay | Admitting: *Deleted

## 2017-05-10 ENCOUNTER — Other Ambulatory Visit: Payer: No Typology Code available for payment source

## 2017-05-10 DIAGNOSIS — Z1211 Encounter for screening for malignant neoplasm of colon: Secondary | ICD-10-CM

## 2017-05-15 LAB — FECAL OCCULT BLOOD, IMMUNOCHEMICAL

## 2017-05-23 ENCOUNTER — Ambulatory Visit (INDEPENDENT_AMBULATORY_CARE_PROVIDER_SITE_OTHER): Payer: No Typology Code available for payment source | Admitting: Internal Medicine

## 2017-05-23 ENCOUNTER — Encounter: Payer: Self-pay | Admitting: Internal Medicine

## 2017-05-23 VITALS — BP 175/99 | HR 50 | Temp 98.1°F | Wt 143.3 lb

## 2017-05-23 DIAGNOSIS — I110 Hypertensive heart disease with heart failure: Secondary | ICD-10-CM

## 2017-05-23 DIAGNOSIS — E7849 Other hyperlipidemia: Secondary | ICD-10-CM

## 2017-05-23 DIAGNOSIS — Z23 Encounter for immunization: Secondary | ICD-10-CM

## 2017-05-23 DIAGNOSIS — F17210 Nicotine dependence, cigarettes, uncomplicated: Secondary | ICD-10-CM | POA: Diagnosis not present

## 2017-05-23 DIAGNOSIS — I5022 Chronic systolic (congestive) heart failure: Secondary | ICD-10-CM | POA: Diagnosis not present

## 2017-05-23 DIAGNOSIS — E785 Hyperlipidemia, unspecified: Secondary | ICD-10-CM | POA: Diagnosis not present

## 2017-05-23 DIAGNOSIS — Z720 Tobacco use: Secondary | ICD-10-CM

## 2017-05-23 DIAGNOSIS — I10 Essential (primary) hypertension: Secondary | ICD-10-CM

## 2017-05-23 MED ORDER — HYDROCHLOROTHIAZIDE 12.5 MG PO TABS: 12.5000 mg | ORAL_TABLET | Freq: Every day | ORAL | 4 refills | Status: DC

## 2017-05-23 MED ORDER — LISINOPRIL 40 MG PO TABS: 40.0000 mg | ORAL_TABLET | Freq: Every day | ORAL | 4 refills | Status: DC

## 2017-05-23 MED FILL — LISINOPRIL 40 MG TABLET: 40 | 30 days supply | Qty: 30 | Fill #0

## 2017-05-23 MED FILL — HYDROCHLOROTHIAZIDE 12.5 MG: 12.5 | 30 days supply | Qty: 30 | Fill #0

## 2017-05-23 NOTE — Assessment & Plan Note (Signed)
BP Readings from Last 3 Encounters:  05/23/17 (!) 175/99  04/25/17 (!) 182/112  08/02/16 (!) 156/92  Blood pressure remains uncontrolled today, unfortunately there was misunderstanding at the last visit where I had increased the lisinopril and started HCTZ and he has not done so yet. We reviewed the changes in medication, he will pick up the HCTZ and correct dose of lisinopril. BMP at last appointment showed potassium and kidney function wnl.  - HCTZ 12.5 mg qd, lisinopril 40 mg qd, metoprolol 25 BID  - encouraged DASH, exercise, and smoking cessation  - follow up in 1 month for repeat blood pressure check

## 2017-05-23 NOTE — Assessment & Plan Note (Signed)
Reports he uses about 3-4 cigarettes per week, not ready to cut back or quit at this time.  -continued encouragement of cessation

## 2017-05-23 NOTE — Progress Notes (Signed)
   CC: follow up of hypertension   HPI:  [name] is a 56 y.o. with PMH hypertension, chronic congestive heart failure with reduced EF ( 8/17 EF 35-40%), tobacco use who presents for follow up of hypertension. Please see the assessment and plans for the status of the patient chronic medical problems.   Past Medical History:  Diagnosis Date  . TIA (transient ischemic attack)    Review of Systems:  Refer to history of present illness and assessment and plans for pertinent review of systems, all others reviewed and negative  Physical Exam:  Vitals:   05/23/17 1514  BP: (!) 175/99  Pulse: (!) 50  Temp: 98.1 F (36.7 C)  TempSrc: Oral  SpO2: 100%  Weight: 143 lb 4.8 oz (65 kg)   General: thin, well appearing, no acute distress  Cardiac: RRR, no murmur appreciated  Pulmonary: Lungs clear to auscultation bilateral, no respiratory distress  Extremities: no peripheral edema   Assessment & Plan:   Tobacco use  Reports he uses about 3-4 cigarettes per week, not ready to cut back or quit at this time.  -continued encouragement of cessation   Hypertension  BP Readings from Last 3 Encounters:  05/23/17 (!) 175/99  04/25/17 (!) 182/112  08/02/16 (!) 156/92  Blood pressure remains uncontrolled today, unfortunately there was misunderstanding at the last visit where I had increased the lisinopril and started HCTZ and he has not done so yet. We reviewed the changes in medication, he will pick up the HCTZ and correct dose of lisinopril. BMP at last appointment showed potassium and kidney function wnl.  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 Reporting no new symptoms related to this such as  orthopnea, PND, peripheral edema, or chest pain. Heart rate 50 demonstrating he is adequately beta blocked.  - continue metoprolol tartrate 25 mg BID, lisinopril 40 mg qd - encouraged to take aspirin 81 mg every day   Hyperlipidemia  Reports no problems with atorvastatin 40 mg. LDL prior to beginning this was 99, goal is < 70.  - Repeat lipid panel today   Health maintenance  - Tdap today  - Another set of FOBT cards provided today ( last could not be processed as the sample was too old )   See Encounters Tab for problem based charting.  Patient discussed with Dr. Daryll Drown

## 2017-05-23 NOTE — Assessment & Plan Note (Signed)
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 Reporting no new symptoms related to this such as orthopnea, PND, peripheral edema, or chest pain. Heart rate 50 demonstrating he is adequately beta blocked.  - continue metoprolol tartrate 25 mg BID, lisinopril 40 mg qd - encouraged to take aspirin 81 mg every day

## 2017-05-23 NOTE — Patient Instructions (Addendum)
It was a pleasure to see you today Sean Leblanc  - For your high blood pressure, start taking Hydrochlorothiazide 12.5 mg and Lisinopril 40 mg every day  - For your heart- start taking the aspirin every day  - For your colon cancer screening, repeat the test and send it in as soon as you have finished the cards. We have given you a tetnus shot today.  - Please call our clinic if you have any problems or questions, we may be able to help you and keep you from a long emergency room wait. Our clinic and after hours phone number is [phone]    DASH Eating Plan [place] stands for "Dietary Approaches to Stop Hypertension." The DASH eating plan is a healthy eating plan that has been shown to reduce high blood pressure (hypertension). It may also reduce your risk for type 2 diabetes, heart disease, and stroke. The DASH eating plan may also help with weight loss. What are tips for following this plan? General guidelines  Avoid eating more than 2,300 mg (milligrams) of salt (sodium) a day. If you have hypertension, you may need to reduce your sodium intake to 1,500 mg a day.  Limit alcohol intake to no more than 1 drink a day for nonpregnant women and 2 drinks a day for men. One drink equals 12 oz of beer, 5 oz of wine, or 1 oz of hard liquor.  Work with your health care provider to maintain a healthy body weight or to lose weight. Ask what an ideal weight is for you.  Get at least 30 minutes of exercise that causes your heart to beat faster (aerobic exercise) most days of the week. Activities may include walking, swimming, or biking.  Work with your health care provider or diet and nutrition specialist (dietitian) to adjust your eating plan to your individual calorie needs. Reading food labels  Check food labels for the amount of sodium per serving. Choose foods with less than 5 percent of the Daily Value of sodium. Generally, foods with less than 300 mg of sodium per serving fit into this eating  plan.  To find whole grains, look for the word "whole" as the first word in the ingredient list. Shopping  Buy products labeled as "low-sodium" or "no salt added."  Buy fresh foods. Avoid canned foods and premade or frozen meals. Cooking  Avoid adding salt when cooking. Use salt-free seasonings or herbs instead of table salt or sea salt. Check with your health care provider or pharmacist before using salt substitutes.  Do not fry foods. Cook foods using healthy methods such as baking, boiling, grilling, and broiling instead.  Cook with heart-healthy oils, such as olive, canola, soybean, or sunflower oil. Meal planning   Eat a balanced diet that includes: ? 5 or more servings of fruits and vegetables each day. At each meal, try to fill half of your plate with fruits and vegetables. ? Up to 6-8 servings of whole grains each day. ? Less than 6 oz of lean meat, poultry, or fish each day. A 3-oz serving of meat is about the same size as a deck of cards. One egg equals 1 oz. ? 2 servings of low-fat dairy each day. ? A serving of nuts, seeds, or beans 5 times each week. ? Heart-healthy fats. Healthy fats called Omega-3 fatty acids are found in foods such as flaxseeds and coldwater fish, like sardines, salmon, and mackerel.  Limit how much you eat of the following: ? Canned or  prepackaged foods. ? Food that is high in trans fat, such as fried foods. ? Food that is high in saturated fat, such as fatty meat. ? Sweets, desserts, sugary drinks, and other foods with added sugar. ? Full-fat dairy products.  Do not salt foods before eating.  Try to eat at least 2 vegetarian meals each week.  Eat more home-cooked food and less restaurant, buffet, and fast food.  When eating at a restaurant, ask that your food be prepared with less salt or no salt, if possible. What foods are recommended? The items listed may not be a complete list. Talk with your dietitian about what dietary choices are best  for you. Grains Whole-grain or whole-wheat bread. Whole-grain or whole-wheat pasta. Brown rice. Sean Leblanc. Bulgur. Whole-grain and low-sodium cereals. Pita bread. Low-fat, low-sodium crackers. Whole-wheat flour tortillas. Vegetables Fresh or frozen vegetables (raw, steamed, roasted, or grilled). Low-sodium or reduced-sodium tomato and vegetable juice. Low-sodium or reduced-sodium tomato sauce and tomato paste. Low-sodium or reduced-sodium canned vegetables. Fruits All fresh, dried, or frozen fruit. Canned fruit in natural juice (without added sugar). Meat and other protein foods Skinless chicken or [place]. Ground chicken or [place]. Pork with fat trimmed off. Fish and seafood. Egg whites. Dried beans, peas, or lentils. Unsalted nuts, nut butters, and seeds. Unsalted canned beans. Lean cuts of beef with fat trimmed off. Low-sodium, lean deli meat. Dairy Low-fat (1%) or fat-free (skim) milk. Fat-free, low-fat, or reduced-fat cheeses. Nonfat, low-sodium ricotta or cottage cheese. Low-fat or nonfat yogurt. Low-fat, low-sodium cheese. Fats and oils Soft margarine without trans fats. Vegetable oil. Low-fat, reduced-fat, or light mayonnaise and salad dressings (reduced-sodium). Canola, safflower, olive, soybean, and sunflower oils. Avocado. Seasoning and other foods Herbs. Spices. Seasoning mixes without salt. Unsalted popcorn and pretzels. Fat-free sweets. What foods are not recommended? The items listed may not be a complete list. Talk with your dietitian about what dietary choices are best for you. Grains Baked goods made with fat, such as croissants, muffins, or some breads. Dry pasta or rice meal packs. Vegetables Creamed or fried vegetables. Vegetables in a cheese sauce. Regular canned vegetables (not low-sodium or reduced-sodium). Regular canned tomato sauce and paste (not low-sodium or reduced-sodium). Regular tomato and vegetable juice (not low-sodium or reduced-sodium). Sean Leblanc.  Olives. Fruits Canned fruit in a light or heavy syrup. Fried fruit. Fruit in cream or butter sauce. Meat and other protein foods Fatty cuts of meat. Ribs. Fried meat. Sean Leblanc. Sausage. Bologna and other processed lunch meats. Salami. Fatback. Hotdogs. Bratwurst. Salted nuts and seeds. Canned beans with added salt. Canned or smoked fish. Whole eggs or egg yolks. Chicken or [place] with skin. Dairy Whole or 2% milk, cream, and half-and-half. Whole or full-fat cream cheese. Whole-fat or sweetened yogurt. Full-fat cheese. Nondairy creamers. Whipped toppings. Processed cheese and cheese spreads. Fats and oils Butter. Stick margarine. Lard. Shortening. Ghee. Bacon fat. Tropical oils, such as coconut, palm kernel, or palm oil. Seasoning and other foods Salted popcorn and pretzels. Onion salt, garlic salt, seasoned salt, table salt, and sea salt. Worcestershire sauce. Tartar sauce. Barbecue sauce. Teriyaki sauce. Soy sauce, including reduced-sodium. Steak sauce. Canned and packaged gravies. Fish sauce. Oyster sauce. Cocktail sauce. Horseradish that you find on the shelf. Ketchup. Mustard. Meat flavorings and tenderizers. Bouillon cubes. Hot sauce and Tabasco sauce. Premade or packaged marinades. Premade or packaged taco seasonings. Relishes. Regular salad dressings. Where to find more information:  National Heart, Lung, and [place]: https://wilson-eaton.com/  American Heart Association: www.heart.org Summary  The DASH  eating plan is a healthy eating plan that has been shown to reduce high blood pressure (hypertension). It may also reduce your risk for type 2 diabetes, heart disease, and stroke.  With the DASH eating plan, you should limit salt (sodium) intake to 2,300 mg a day. If you have hypertension, you may need to reduce your sodium intake to 1,500 mg a day.  When on the DASH eating plan, aim to eat more fresh fruits and vegetables, whole grains, lean proteins, low-fat dairy, and heart-healthy  fats.  Work with your health care provider or diet and nutrition specialist (dietitian) to adjust your eating plan to your individual calorie needs. This information is not intended to replace advice given to you by your health care provider. Make sure you discuss any questions you have with your health care provider. Document Released: 06/16/2011 Document Revised: 06/20/2016 Document Reviewed: 06/20/2016 Elsevier Interactive Patient Education  2017 [employer].

## 2017-05-23 NOTE — Assessment & Plan Note (Signed)
Reports no problems with atorvastatin 40 mg. LDL prior to beginning this was 99, goal is < 70.  - Repeat lipid panel today

## 2017-05-24 LAB — LIPID PANEL
CHOLESTEROL TOTAL: 114 mg/dL (ref 100–199)
Chol/HDL Ratio: 2.5 ratio (ref 0.0–5.0)
HDL: 46 mg/dL (ref >39)
LDL Calculated: 57 mg/dL (ref 0–99)
Triglycerides: 55 mg/dL (ref 0–149)
VLDL Cholesterol Cal: 11 mg/dL (ref 5–40)

## 2017-05-24 NOTE — Progress Notes (Signed)
Internal Medicine Clinic Attending  Case discussed with Dr. Blum at the time of the visit.  We reviewed the resident's history and exam and pertinent patient test results.  I agree with the assessment, diagnosis, and plan of care documented in the resident's note. 

## 2017-05-30 ENCOUNTER — Ambulatory Visit (INDEPENDENT_AMBULATORY_CARE_PROVIDER_SITE_OTHER): Payer: No Typology Code available for payment source | Admitting: Cardiology

## 2017-05-30 ENCOUNTER — Encounter: Payer: Self-pay | Admitting: Cardiology

## 2017-05-30 ENCOUNTER — Ambulatory Visit: Payer: No Typology Code available for payment source | Admitting: Cardiology

## 2017-05-30 VITALS — BP 141/78 | HR 56 | Wt 144.0 lb

## 2017-05-30 DIAGNOSIS — I429 Cardiomyopathy, unspecified: Secondary | ICD-10-CM

## 2017-05-30 NOTE — Assessment & Plan Note (Signed)
Lt brain TIA Aug 2017 ( Dr Leonie Man)

## 2017-05-30 NOTE — Assessment & Plan Note (Signed)
HCVD with [facility] on his EKG No CHF symptoms

## 2017-05-30 NOTE — Assessment & Plan Note (Signed)
Echo 02/2016 EF 35-40%, severe concentric hypertrophy, diffuse hypokinesis

## 2017-05-30 NOTE — Progress Notes (Signed)
05/30/2017 Sean Leblanc   1961-04-09  [id number]  Primary Physician Ledell Noss, MD Primary Cardiologist: Dr Martinique  HPI:  56 y/o single AA male, works on an Designer, television/film set, initially presented in Aug 2017 with a Lt brain TIA. Echo then showed an EF of 35-40% with moderate diffuse HK. He has been followed by [facility]. He has treated HTN. He has never had an MI, chest pian, or seen a cardiologist. He has not had CHF symptoms or recurrent TIA symptoms since his event Aug 2017. He is referred now for further evaluation of his cardiomyopathy.    Current Outpatient Medications  Medication Sig Dispense Refill  . atorvastatin (LIPITOR) 40 MG tablet Take 1 tablet (40 mg total) by mouth daily. 60 tablet 4  . EQ CHILDRENS ASPIRIN 81 MG chewable tablet CHEW AND SWALLOW ONE TABLET BY MOUTH ONCE DAILY 108 tablet 4  . hydrochlorothiazide (HYDRODIURIL) 12.5 MG tablet Take 1 tablet (12.5 mg total) daily by mouth. 30 tablet 4  . lisinopril (PRINIVIL,ZESTRIL) 40 MG tablet Take 1 tablet (40 mg total) daily by mouth. 30 tablet 4  . metoprolol tartrate (LOPRESSOR) 25 MG tablet Take 1 tablet (25 mg total) by mouth 2 (two) times daily. 90 tablet 6  . research study medication Take 75 mg by mouth daily with breakfast.     No current facility-administered medications for this visit.     No Known Allergies  Past Medical History:  Diagnosis Date  . TIA (transient ischemic attack)     Social History   Socioeconomic History  . Marital status: Single    Spouse name: Not on file  . Number of children: Not on file  . Years of education: Not on file  . Highest education level: Not on file  Social Needs  . Financial resource strain: Not on file  . Food insecurity - worry: Not on file  . Food insecurity - inability: Not on file  . Transportation needs - medical: Not on file  . Transportation needs - non-medical: Not on file  Occupational History  . Not on file  Tobacco Use  . Smoking  status: Current Every Day Smoker    Packs/day: 0.50    Types: Cigarettes  . Smokeless tobacco: Never Used  Substance and Sexual Activity  . Alcohol use: Yes    Comment: 2 X weekly - 6pack - 12 pack  . Drug use: Not on file  . Sexual activity: Not on file  Other Topics Concern  . Not on file  Social History Narrative  . Not on file     Family History  Problem Relation Age of Onset  . Heart attack Mother   . Cancer Father      Review of Systems: General: negative for chills, fever, night sweats or weight changes.  Cardiovascular: negative for chest pain, dyspnea on exertion, edema, orthopnea, palpitations, paroxysmal nocturnal dyspnea or shortness of breath Dermatological: negative for rash Respiratory: negative for cough or wheezing Urologic: negative for hematuria Abdominal: negative for nausea, vomiting, diarrhea, bright red blood per rectum, melena, or hematemesis Neurologic: negative for visual changes, syncope, or dizziness All other systems reviewed and are otherwise negative except as noted above.    Blood pressure (!) 141/78, pulse (!) 56, weight 144 lb (65.3 kg).  General appearance: alert, cooperative, no distress and thin Neck: no carotid bruit and no JVD Lungs: clear to auscultation bilaterally Heart: regular rate and rhythm Abdomen: soft, non-tender; bowel sounds normal; no masses,  no organomegaly Extremities: extremities normal, atraumatic, no cyanosis or edema Pulses: 2+ and symmetric Skin: Skin color, texture, turgor normal. No rashes or lesions Neurologic: Grossly normal  EKG NSR, SB, LVH  ASSESSMENT AND PLAN:   Cardiomyopathy- suspect HTN CM- r/o ischemic Echo 02/2016 EF 35-40%, severe concentric hypertrophy, diffuse hypokinesis  History of transient ischemic attack (TIA) Lt brain TIA Aug 2017 ( Dr Leonie Man)  HTN (hypertension) HCVD with LVH on his EKG No CHF symptoms   PLAN  Discussed with Dr Martinique. Will obtain an echo and GXT Myoview.  Further work up pending these results. Consider transition to [facility] if his EF remains depressed.   Kerin Ransom PA-C 05/30/2017 3:25 PM

## 2017-05-30 NOTE — Patient Instructions (Addendum)
Medication Instructions:  NO CHANGES If you need a refill on your cardiac medications before your next appointment, please call your pharmacy.  Testing/Procedures: Your physician has requested that you have a STRESS myoview. For further information please visit [email or web address]. Please follow instruction sheet, as given.  Your physician has requested that you have an echocardiogram. Echocardiography is a painless test that uses sound waves to create images of your heart. It provides your doctor with information about the size and shape of your heart and how well your heart's chambers and valves are working. This procedure takes approximately one hour. There are no restrictions for this procedure.   Follow-Up: Your physician wants you to follow-up in: 3 WEEKS WITH DR Martinique.   Thank you for choosing [facility] at [facility]!!

## 2017-05-31 MED FILL — ATORVASTATIN 40 MG TABLET: 40 | 30 days supply | Qty: 30 | Fill #7

## 2017-06-06 ENCOUNTER — Ambulatory Visit (HOSPITAL_COMMUNITY): Payer: No Typology Code available for payment source | Attending: Cardiology

## 2017-06-06 ENCOUNTER — Other Ambulatory Visit: Payer: Self-pay

## 2017-06-06 DIAGNOSIS — I517 Cardiomegaly: Secondary | ICD-10-CM | POA: Insufficient documentation

## 2017-06-06 DIAGNOSIS — Z8673 Personal history of transient ischemic attack (TIA), and cerebral infarction without residual deficits: Secondary | ICD-10-CM | POA: Diagnosis not present

## 2017-06-06 DIAGNOSIS — I429 Cardiomyopathy, unspecified: Secondary | ICD-10-CM | POA: Diagnosis not present

## 2017-06-08 ENCOUNTER — Telehealth (HOSPITAL_COMMUNITY): Payer: Self-pay

## 2017-06-08 NOTE — Telephone Encounter (Signed)
Encounter complete. 

## 2017-06-09 ENCOUNTER — Ambulatory Visit (HOSPITAL_COMMUNITY)
Admission: RE | Admit: 2017-06-09 | Discharge: 2017-06-09 | Disposition: A | Discharge status: Home / Self Care | Payer: No Typology Code available for payment source | Source: Ambulatory Visit | Attending: Cardiology | Admitting: Cardiology

## 2017-06-09 DIAGNOSIS — Z8249 Family history of ischemic heart disease and other diseases of the circulatory system: Secondary | ICD-10-CM | POA: Diagnosis not present

## 2017-06-09 DIAGNOSIS — I429 Cardiomyopathy, unspecified: Secondary | ICD-10-CM | POA: Diagnosis not present

## 2017-06-09 MED ORDER — REGADENOSON 0.4 MG/5ML IV SOLN
0.4000 mg | Freq: Once | INTRAVENOUS | Status: AC
  Administered 2017-06-09: 0.4 mg via INTRAVENOUS

## 2017-06-09 MED ORDER — TECHNETIUM TC 99M TETROFOSMIN IV KIT
10.7000 | PACK | Freq: Once | INTRAVENOUS | Status: AC | PRN
  Administered 2017-06-09: 10.7 via INTRAVENOUS
  Filled 2017-06-09: qty 11

## 2017-06-09 MED ORDER — TECHNETIUM TC 99M TETROFOSMIN IV KIT
30.4000 | PACK | Freq: Once | INTRAVENOUS | Status: AC | PRN
  Administered 2017-06-09: 30.4 via INTRAVENOUS
  Filled 2017-06-09: qty 31

## 2017-06-12 LAB — MYOCARDIAL PERFUSION IMAGING
LV dias vol: 175 mL (ref 62–150)
LV sys vol: 106 mL
Peak HR: 95 {beats}/min
Rest HR: 55 {beats}/min
SDS: 1
SRS: 1
SSS: 2
TID: 1.07

## 2017-06-14 MED FILL — METOPROLOL TARTRATE 25 MG T: 25 | 30 days supply | Qty: 60 | Fill #6

## 2017-06-20 ENCOUNTER — Encounter: Payer: No Typology Code available for payment source | Admitting: Internal Medicine

## 2017-06-20 NOTE — Progress Notes (Deleted)
Cardiology Office Note   Date:  06/20/2017   ID:  Sean Leblanc, DOB 1961-01-20, MRN [id number]  PCP:  Ledell Noss, MD  Cardiologist:   Peter Martinique, MD   No chief complaint on file.     History of Present Illness: Sean Leblanc is a 56 y.o. male who presents for follow up of CHF. He  initially presented in Aug 2017 with a Lt brain TIA. Echo then showed an EF of 35-40% with moderate diffuse HK. He has been followed by [facility]. He has treated HTN. He has never had an MI, chest pian, or seen a cardiologist. He has not had CHF symptoms or recurrent TIA symptoms since his event Aug 2017. He was seen in follow up by [facility] in November. Repeat Echo showed improvement in EF to 40-45%. Myoview study showed no ischemia.     Past Medical History:  Diagnosis Date  . Cardiomyopathy ([place])   . Hypertension   . TIA (transient ischemic attack)     No past surgical history on file.   Current Outpatient Medications  Medication Sig Dispense Refill  . atorvastatin (LIPITOR) 40 MG tablet Take 1 tablet (40 mg total) by mouth daily. 60 tablet 4  . EQ CHILDRENS ASPIRIN 81 MG chewable tablet CHEW AND SWALLOW ONE TABLET BY MOUTH ONCE DAILY 108 tablet 4  . hydrochlorothiazide (HYDRODIURIL) 12.5 MG tablet Take 1 tablet (12.5 mg total) daily by mouth. 30 tablet 4  . lisinopril (PRINIVIL,ZESTRIL) 40 MG tablet Take 1 tablet (40 mg total) daily by mouth. 30 tablet 4  . metoprolol tartrate (LOPRESSOR) 25 MG tablet Take 1 tablet (25 mg total) by mouth 2 (two) times daily. 90 tablet 6  . research study medication Take 75 mg by mouth daily with breakfast.     No current facility-administered medications for this visit.     Allergies:   Patient has no known allergies.    Social History:  The patient  reports that he has been smoking cigarettes.  He has been smoking about 0.50 packs per day. he has never used smokeless tobacco. He reports that he drinks alcohol.   Family  History:  The patient's ***family history includes Cancer in his father; Heart attack in his mother.    ROS:  Please see the history of present illness.   Otherwise, review of systems are positive for {NONE DEFAULTED:18576::"none"}.   All other systems are reviewed and negative.    PHYSICAL EXAM: VS:  There were no vitals taken for this visit. , BMI There is no height or weight on file to calculate BMI. GEN: Well nourished, well developed, in no acute distress  HEENT: normal  Neck: no JVD, carotid bruits, or masses Cardiac: ***RRR; no murmurs, rubs, or gallops,no edema  Respiratory:  clear to auscultation bilaterally, normal work of breathing GI: soft, nontender, nondistended, + BS MS: no deformity or atrophy  Skin: warm and dry, no rash Neuro:  Strength and sensation are intact Psych: euthymic mood, full affect   EKG:  EKG {ACTION; IS/IS LOV:56433295} ordered today. The ekg ordered today demonstrates ***   Recent Labs: 04/25/2017: BUN 10; Creatinine, Ser 0.89; Potassium 4.2; Sodium 140    Lipid Panel    Component Value Date/Time   CHOL 114 05/23/2017 1559   TRIG 55 05/23/2017 1559   HDL 46 05/23/2017 1559   CHOLHDL 2.5 05/23/2017 1559   CHOLHDL 3.8 03/04/2016 0046   VLDL 23 03/04/2016 0046   LDLCALC  57 05/23/2017 1559      Wt Readings from Last 3 Encounters:  06/09/17 144 lb (65.3 kg)  05/30/17 144 lb (65.3 kg)  05/23/17 143 lb 4.8 oz (65 kg)      Other studies Reviewed: Additional studies/ records that were reviewed today include:   Echo : 06/06/17: Study Conclusions  - Left ventricle: The cavity size was normal. Wall thickness was   increased in a pattern of mild LVH. Systolic function was mildly   to moderately reduced. The estimated ejection fraction was in the   range of 40% to 45%. Diffuse hypokinesis. Features are consistent   with a pseudonormal left ventricular filling pattern, with   concomitant abnormal relaxation and increased filling pressure    (grade 2 diastolic dysfunction). - Aortic valve: There was no stenosis. - Mitral valve: Mildly calcified annulus. Normal thickness leaflets   . There was no significant regurgitation. - Left atrium: The atrium was mildly dilated. - Right ventricle: The cavity size was normal. Systolic function   was normal. - Pulmonary arteries: No complete TR doppler jet so unable to   estimate PA systolic pressure. - Inferior vena cava: The vessel was normal in size. The   respirophasic diameter changes were in the normal range (>= 50%),   consistent with normal central venous pressure.  Impressions:  - Normal LV size with mild LV hypertrophy. EF 40-45%, diffuse   hypokinesis. Normal RV size and systolic function. No significant   valvular abnormalities.  Myoview 06/09/17: Study Highlights    The left ventricular ejection fraction is moderately decreased (30-44%).  Nuclear stress EF: 39%.  There was no ST segment deviation noted during stress.  Patient developed a LBBB with the infusion of Lexiscan.  This is an intermediate risk study due to reduced systolic function.  No ischemia.      ASSESSMENT AND PLAN:  1.  ***   Current medicines are reviewed at length with the patient today.  The patient {ACTIONS; HAS/DOES NOT HAVE:19233} concerns regarding medicines.  The following changes have been made:  {PLAN; NO CHANGE:13088:s}  Labs/ tests ordered today include: *** No orders of the defined types were placed in this encounter.    Disposition:   FU with *** in {gen number 7-74:142395} {Days to years:10300}  Signed, Peter Martinique, MD  06/20/2017 7:37 PM    [facility] [address], [place], [place], [address] Phone [phone], Fax [phone]

## 2017-06-21 ENCOUNTER — Ambulatory Visit: Payer: No Typology Code available for payment source | Admitting: Cardiology

## 2017-06-22 NOTE — Progress Notes (Signed)
   CC: follow up of hypertension   HPI:  Sean Leblanc is a 56 y.o. with PMH hypertension, chronic congestive heart failure with reduced EF and tobacco use who presents for follow up of hypertension. Please see the assessment and plans for the status of the patient chronic medical problems.   Past Medical History:  Diagnosis Date  . Cardiomyopathy ([place])   . Hypertension   . TIA (transient ischemic attack)    Review of Systems:  Refer to history of present illness and assessment and plans for pertinent review of systems, all others reviewed and negative  Physical Exam:  Vitals:   06/26/17 1454 06/26/17 1526  BP: (!) 153/95 124/86  Pulse: (!) 56 (!) 52  Temp: 98.3 F (36.8 C)   TempSrc: Oral   SpO2: 100%   Weight: 146 lb 12.8 oz (66.6 kg)   Height: 6' (1.829 m)    General: well appearing, no acute distress  Cardiac: RRR, no murmur appreciated  Pulm: lungs clear to auscultation  Assessment & Plan:   Hypertension  BP Readings from Last 3 Encounters:  06/26/17 124/86  05/30/17 (!) 141/78  05/23/17 (!) 175/99  At last visit BP was not at goal due to misunderstanding over medications which had been started at the visit prior. He had been taking lisinopril 20 mg qd and metoprolol so he was asked to pick up the increased dose of lisinopril 40 mg qd and HCTZ 12.5 mg qd. Today he is here for blood pressure recheck. Blood pressure is now at goal.   - continue HCTZ 12.5 mg qd, lisinopril 40 mg qd, metoprolol tartrate 25 BID  - encouraged smoking cessation, low salt diet, and exercise   See Encounters Tab for problem based charting.  Patient discussed with Dr. Lynnae January

## 2017-06-26 ENCOUNTER — Encounter: Payer: Self-pay | Admitting: Internal Medicine

## 2017-06-26 ENCOUNTER — Ambulatory Visit (INDEPENDENT_AMBULATORY_CARE_PROVIDER_SITE_OTHER): Payer: No Typology Code available for payment source | Admitting: Internal Medicine

## 2017-06-26 ENCOUNTER — Other Ambulatory Visit: Payer: Self-pay

## 2017-06-26 DIAGNOSIS — Z79899 Other long term (current) drug therapy: Secondary | ICD-10-CM | POA: Diagnosis not present

## 2017-06-26 DIAGNOSIS — F17210 Nicotine dependence, cigarettes, uncomplicated: Secondary | ICD-10-CM | POA: Diagnosis not present

## 2017-06-26 DIAGNOSIS — I5032 Chronic diastolic (congestive) heart failure: Secondary | ICD-10-CM

## 2017-06-26 DIAGNOSIS — I10 Essential (primary) hypertension: Secondary | ICD-10-CM | POA: Diagnosis not present

## 2017-06-26 NOTE — Patient Instructions (Signed)
FOLLOW-UP INSTRUCTIONS When: 6 months, Dr. Hetty Ely  For: blood pressure check  What to bring: medication bottles   It was a pleasure to see you today [name]  - For your blood pressure - take Hydrochlorothiazide 12.5 mg daily and Lisinopril 40 mg daily, keep cutting back on the cigarettes, try to exercise and eat a low salt diet  - Please call our clinic if you have any problems or questions, we may be able to help you and keep you from a long emergency room wait. Our clinic and after hours phone number is [phone]    DASH Eating Plan [place] stands for "Dietary Approaches to Stop Hypertension." The DASH eating plan is a healthy eating plan that has been shown to reduce high blood pressure (hypertension). It may also reduce your risk for type 2 diabetes, heart disease, and stroke. The DASH eating plan may also help with weight loss. What are tips for following this plan? General guidelines  Avoid eating more than 2,300 mg (milligrams) of salt (sodium) a day. If you have hypertension, you may need to reduce your sodium intake to 1,500 mg a day.  Limit alcohol intake to no more than 1 drink a day for nonpregnant women and 2 drinks a day for men. One drink equals 12 oz of beer, 5 oz of wine, or 1 oz of hard liquor.  Work with your health care provider to maintain a healthy body weight or to lose weight. Ask what an ideal weight is for you.  Get at least 30 minutes of exercise that causes your heart to beat faster (aerobic exercise) most days of the week. Activities may include walking, swimming, or biking.  Work with your health care provider or diet and nutrition specialist (dietitian) to adjust your eating plan to your individual calorie needs. Reading food labels  Check food labels for the amount of sodium per serving. Choose foods with less than 5 percent of the Daily Value of sodium. Generally, foods with less than 300 mg of sodium per serving fit into this eating plan.  To find whole  grains, look for the word "whole" as the first word in the ingredient list. Shopping  Buy products labeled as "low-sodium" or "no salt added."  Buy fresh foods. Avoid canned foods and premade or frozen meals. Cooking  Avoid adding salt when cooking. Use salt-free seasonings or herbs instead of table salt or sea salt. Check with your health care provider or pharmacist before using salt substitutes.  Do not fry foods. Cook foods using healthy methods such as baking, boiling, grilling, and broiling instead.  Cook with heart-healthy oils, such as olive, canola, soybean, or sunflower oil. Meal planning   Eat a balanced diet that includes: ? 5 or more servings of fruits and vegetables each day. At each meal, try to fill half of your plate with fruits and vegetables. ? Up to 6-8 servings of whole grains each day. ? Less than 6 oz of lean meat, poultry, or fish each day. A 3-oz serving of meat is about the same size as a deck of cards. One egg equals 1 oz. ? 2 servings of low-fat dairy each day. ? A serving of nuts, seeds, or beans 5 times each week. ? Heart-healthy fats. Healthy fats called Omega-3 fatty acids are found in foods such as flaxseeds and coldwater fish, like sardines, salmon, and mackerel.  Limit how much you eat of the following: ? Canned or prepackaged foods. ? Food that is high  in trans fat, such as fried foods. ? Food that is high in saturated fat, such as fatty meat. ? Sweets, desserts, sugary drinks, and other foods with added sugar. ? Full-fat dairy products.  Do not salt foods before eating.  Try to eat at least 2 vegetarian meals each week.  Eat more home-cooked food and less restaurant, buffet, and fast food.  When eating at a restaurant, ask that your food be prepared with less salt or no salt, if possible. What foods are recommended? The items listed may not be a complete list. Talk with your dietitian about what dietary choices are best for  you. Grains Whole-grain or whole-wheat bread. Whole-grain or whole-wheat pasta. Brown rice. [place]row. Bulgur. Whole-grain and low-sodium cereals. Pita bread. Low-fat, low-sodium crackers. Whole-wheat flour tortillas. Vegetables Fresh or frozen vegetables (raw, steamed, roasted, or grilled). Low-sodium or reduced-sodium tomato and vegetable juice. Low-sodium or reduced-sodium tomato sauce and tomato paste. Low-sodium or reduced-sodium canned vegetables. Fruits All fresh, dried, or frozen fruit. Canned fruit in natural juice (without added sugar). Meat and other protein foods Skinless chicken or [place]. Ground chicken or [place]. Pork with fat trimmed off. Fish and seafood. Egg whites. Dried beans, peas, or lentils. Unsalted nuts, nut butters, and seeds. Unsalted canned beans. Lean cuts of beef with fat trimmed off. Low-sodium, lean deli meat. Dairy Low-fat (1%) or fat-free (skim) milk. Fat-free, low-fat, or reduced-fat cheeses. Nonfat, low-sodium ricotta or cottage cheese. Low-fat or nonfat yogurt. Low-fat, low-sodium cheese. Fats and oils Soft margarine without trans fats. Vegetable oil. Low-fat, reduced-fat, or light mayonnaise and salad dressings (reduced-sodium). Canola, safflower, olive, soybean, and sunflower oils. Avocado. Seasoning and other foods Herbs. Spices. Seasoning mixes without salt. Unsalted popcorn and pretzels. Fat-free sweets. What foods are not recommended? The items listed may not be a complete list. Talk with your dietitian about what dietary choices are best for you. Grains Baked goods made with fat, such as croissants, muffins, or some breads. Dry pasta or rice meal packs. Vegetables Creamed or fried vegetables. Vegetables in a cheese sauce. Regular canned vegetables (not low-sodium or reduced-sodium). Regular canned tomato sauce and paste (not low-sodium or reduced-sodium). Regular tomato and vegetable juice (not low-sodium or reduced-sodium). Angie Fava.  Olives. Fruits Canned fruit in a light or heavy syrup. Fried fruit. Fruit in cream or butter sauce. Meat and other protein foods Fatty cuts of meat. Ribs. Fried meat. Berniece Salines. Sausage. Bologna and other processed lunch meats. Salami. Fatback. Hotdogs. Bratwurst. Salted nuts and seeds. Canned beans with added salt. Canned or smoked fish. Whole eggs or egg yolks. Chicken or [place] with skin. Dairy Whole or 2% milk, cream, and half-and-half. Whole or full-fat cream cheese. Whole-fat or sweetened yogurt. Full-fat cheese. Nondairy creamers. Whipped toppings. Processed cheese and cheese spreads. Fats and oils Butter. Stick margarine. Lard. Shortening. Ghee. Bacon fat. Tropical oils, such as coconut, palm kernel, or palm oil. Seasoning and other foods Salted popcorn and pretzels. Onion salt, garlic salt, seasoned salt, table salt, and sea salt. Worcestershire sauce. Tartar sauce. Barbecue sauce. Teriyaki sauce. Soy sauce, including reduced-sodium. Steak sauce. Canned and packaged gravies. Fish sauce. Oyster sauce. Cocktail sauce. Horseradish that you find on the shelf. Ketchup. Mustard. Meat flavorings and tenderizers. Bouillon cubes. Hot sauce and Tabasco sauce. Premade or packaged marinades. Premade or packaged taco seasonings. Relishes. Regular salad dressings. Where to find more information:  National Heart, Lung, and [place]: https://wilson-eaton.com/  American Heart Association: www.heart.org Summary  The DASH eating plan is a healthy eating plan  that has been shown to reduce high blood pressure (hypertension). It may also reduce your risk for type 2 diabetes, heart disease, and stroke.  With the DASH eating plan, you should limit salt (sodium) intake to 2,300 mg a day. If you have hypertension, you may need to reduce your sodium intake to 1,500 mg a day.  When on the DASH eating plan, aim to eat more fresh fruits and vegetables, whole grains, lean proteins, low-fat dairy, and heart-healthy  fats.  Work with your health care provider or diet and nutrition specialist (dietitian) to adjust your eating plan to your individual calorie needs. This information is not intended to replace advice given to you by your health care provider. Make sure you discuss any questions you have with your health care provider. Document Released: 06/16/2011 Document Revised: 06/20/2016 Document Reviewed: 06/20/2016 Elsevier Interactive Patient Education  2017 [employer].

## 2017-06-26 NOTE — Assessment & Plan Note (Signed)
BP Readings from Last 3 Encounters:  06/26/17 124/86  05/30/17 (!) 141/78  05/23/17 (!) 175/99  At last visit BP was not at goal due to misunderstanding over medications which had been started at the visit prior. He had been taking lisinopril 20 mg qd and metoprolol so he was asked to pick up the increased dose of lisinopril 40 mg qd and HCTZ 12.5 mg qd. Today he is here for blood pressure recheck. Blood pressure is now at goal.   - continue HCTZ 12.5 mg qd, lisinopril 40 mg qd, metoprolol tartrate 25 BID  - encouraged smoking cessation, low salt diet, and exercise

## 2017-06-27 NOTE — Progress Notes (Signed)
Internal Medicine Clinic Attending  Case discussed with Dr. Blum at the time of the visit.  We reviewed the resident's history and exam and pertinent patient test results.  I agree with the assessment, diagnosis, and plan of care documented in the resident's note. 

## 2017-06-28 MED FILL — LISINOPRIL 40 MG TABLET: 40 | 30 days supply | Qty: 30 | Fill #1

## 2017-06-28 MED FILL — HYDROCHLOROTHIAZIDE 12.5 MG: 12.5 | 30 days supply | Qty: 30 | Fill #1

## 2017-07-12 ENCOUNTER — Other Ambulatory Visit: Payer: Self-pay | Admitting: Internal Medicine

## 2017-07-17 MED FILL — ATORVASTATIN 40 MG TABLET: 40 | 30 days supply | Qty: 30 | Fill #0

## 2017-07-19 MED FILL — METOPROLOL TARTRATE 25 MG T: 25 | 30 days supply | Qty: 60 | Fill #7

## 2017-08-02 MED FILL — LISINOPRIL 40 MG TABLET: 40 | 30 days supply | Qty: 30 | Fill #2

## 2017-08-02 MED FILL — HYDROCHLOROTHIAZIDE 12.5 MG: 12.5 | 30 days supply | Qty: 30 | Fill #2

## 2017-08-30 MED FILL — ATORVASTATIN 40 MG TABLET: 40 | 30 days supply | Qty: 30 | Fill #1

## 2017-09-13 ENCOUNTER — Other Ambulatory Visit: Payer: Self-pay | Admitting: Internal Medicine

## 2017-09-13 DIAGNOSIS — I5042 Chronic combined systolic (congestive) and diastolic (congestive) heart failure: Secondary | ICD-10-CM

## 2017-09-13 MED FILL — LISINOPRIL 40 MG TABLET: 40 | 30 days supply | Qty: 30 | Fill #3

## 2017-09-13 MED FILL — HYDROCHLOROTHIAZIDE 12.5 MG: 12.5 | 30 days supply | Qty: 30 | Fill #3

## 2017-09-13 MED FILL — METOPROLOL TARTRATE 25 MG T: 25 | 30 days supply | Qty: 60 | Fill #0

## 2017-10-18 MED FILL — ATORVASTATIN 40 MG TABLET: 40 | 30 days supply | Qty: 30 | Fill #2

## 2017-10-18 MED FILL — LISINOPRIL 40 MG TABLET: 40 | 30 days supply | Qty: 30 | Fill #4

## 2017-11-01 MED FILL — HYDROCHLOROTHIAZIDE 12.5 MG: 12.5 | 30 days supply | Qty: 30 | Fill #4

## 2017-11-29 ENCOUNTER — Other Ambulatory Visit: Payer: Self-pay | Admitting: Internal Medicine

## 2017-11-29 MED FILL — METOPROLOL TARTRATE 25 MG T: 25 | 30 days supply | Qty: 60 | Fill #1

## 2017-11-29 MED FILL — ATORVASTATIN 40 MG TABLET: 40 | 30 days supply | Qty: 30 | Fill #3

## 2017-11-30 MED FILL — LISINOPRIL 40 MG TABLET: 40 | 30 days supply | Qty: 30 | Fill #0

## 2017-12-26 ENCOUNTER — Encounter: Payer: Self-pay | Admitting: Internal Medicine

## 2017-12-26 ENCOUNTER — Encounter: Payer: No Typology Code available for payment source | Admitting: Internal Medicine

## 2018-01-03 ENCOUNTER — Other Ambulatory Visit: Payer: Self-pay | Admitting: Internal Medicine

## 2018-01-03 MED FILL — ATORVASTATIN 40 MG TABLET: 40 | 30 days supply | Qty: 30 | Fill #4

## 2018-01-03 MED FILL — HYDROCHLOROTHIAZIDE 12.5 MG: 12.5 | 30 days supply | Qty: 30 | Fill #0

## 2018-01-03 MED FILL — METOPROLOL TARTRATE 25 MG T: 25 | 30 days supply | Qty: 60 | Fill #2

## 2018-01-03 MED FILL — LISINOPRIL 40 MG TABLET: 40 | 30 days supply | Qty: 30 | Fill #1

## 2018-01-03 NOTE — Telephone Encounter (Signed)
Next appt scheduled 8/20 with PCP. 

## 2018-02-07 MED FILL — HYDROCHLOROTHIAZIDE 12.5 MG: 12.5 | 30 days supply | Qty: 30 | Fill #1

## 2018-02-07 MED FILL — LISINOPRIL 40 MG TABLET: 40 | 30 days supply | Qty: 30 | Fill #2

## 2018-02-07 MED FILL — ATORVASTATIN 40 MG TABLET: 40 | 30 days supply | Qty: 30 | Fill #5

## 2018-02-07 MED FILL — METOPROLOL TARTRATE 25 MG T: 25 | 30 days supply | Qty: 60 | Fill #3

## 2018-02-27 ENCOUNTER — Ambulatory Visit (INDEPENDENT_AMBULATORY_CARE_PROVIDER_SITE_OTHER): Payer: 59 | Admitting: Internal Medicine

## 2018-02-27 ENCOUNTER — Encounter: Payer: Self-pay | Admitting: Internal Medicine

## 2018-02-27 ENCOUNTER — Other Ambulatory Visit: Payer: Self-pay

## 2018-02-27 VITALS — BP 112/82 | HR 62 | Temp 98.2°F | Ht 72.0 in | Wt 139.7 lb

## 2018-02-27 DIAGNOSIS — I447 Left bundle-branch block, unspecified: Secondary | ICD-10-CM

## 2018-02-27 DIAGNOSIS — Z7982 Long term (current) use of aspirin: Secondary | ICD-10-CM

## 2018-02-27 DIAGNOSIS — R1900 Intra-abdominal and pelvic swelling, mass and lump, unspecified site: Secondary | ICD-10-CM

## 2018-02-27 DIAGNOSIS — F17210 Nicotine dependence, cigarettes, uncomplicated: Secondary | ICD-10-CM

## 2018-02-27 DIAGNOSIS — I429 Cardiomyopathy, unspecified: Secondary | ICD-10-CM

## 2018-02-27 DIAGNOSIS — I110 Hypertensive heart disease with heart failure: Secondary | ICD-10-CM | POA: Diagnosis not present

## 2018-02-27 DIAGNOSIS — Z720 Tobacco use: Secondary | ICD-10-CM

## 2018-02-27 DIAGNOSIS — Z79899 Other long term (current) drug therapy: Secondary | ICD-10-CM

## 2018-02-27 DIAGNOSIS — I5042 Chronic combined systolic (congestive) and diastolic (congestive) heart failure: Secondary | ICD-10-CM | POA: Diagnosis not present

## 2018-02-27 DIAGNOSIS — I10 Essential (primary) hypertension: Secondary | ICD-10-CM

## 2018-02-27 NOTE — Assessment & Plan Note (Addendum)
Smoking 1/2 PPD. At work he smokes during two breaks but is used to smoking after meals. Years ago he was able to quit when he had strep throat, 1 week after the symptoms resolved he resumed smoking. He has not tried patches or nicorette gum. He would prefer to continue trying things on his own and would like to talk about it again next office visit. We discussed the significant risk of cardiovascular event from continued tobacco use.  Contemplative. Will continue to support and monitor.

## 2018-02-27 NOTE — Assessment & Plan Note (Signed)
Last echo 05/2017 EF 40-45%, G2 DD improved from 02/2016 EF 35-40% Myoview 05/2017 was intermediate risk because he developed a LBBB during stress  Denies dyspnea on exertion, orthopnea, PND, peripheral edema. He is due for follow up with cardiology, I am curious if he may need further ischemic evaluation with cardiac catheterization given his intermediate risk myoview.  - continue metoprolol tartrate 35 mg BID and lisinopril  - LDL 57 05/2017, continue atorvastatin 40   - continue aspirin 81 mg daily

## 2018-02-27 NOTE — Progress Notes (Signed)
   CC: follow up of hypertension   HPI:  [name] is a 57 y.o. with PMH hypertension, chronic congestive heart failure with reduced EF and tobacco use who presents for follow up of hypertension. Please see the assessment and plans for the status of the patient chronic medical problems.   Review of Systems:  Refer to history of present illness and assessment and plans for pertinent review of systems, all others reviewed and negative  Physical Exam:  Vitals:   02/27/18 1357  BP: 112/82  Pulse: 62  Temp: 98.2 F (36.8 C)  TempSrc: Oral  SpO2: 100%  Weight: 139 lb 11.2 oz (63.4 kg)  Height: 6' (1.829 m)   General: well appearing, no acute distress  Cardiac: regular rate and rhythm, no murmur, no JVD, no peripheral edema, the extremities are well perfused  Pulm: normal work of breathing, lungs clear to auscultation  GI: the abdomen is soft, non tender, non distended, there is a pulsatile mass in the mid abdomen at the level of the epigastrium   Assessment & Plan:   Pulsatile abdominal mass  Today I noticed a pulsatile mid abdominal mass on Sean Leblanc Leblanc. Because of his age and history of tobacco use I feel that we should evaluate for abdominal aortic aneurysm. I have discussed this concern with Sean Leblanc Leblanc and he is agreeable with proceeding to abdominal ultrasound.   Hypertension  BP Readings from Last 3 Encounters:  02/27/18 112/82  06/26/17 124/86  05/30/17 (!) 141/78  Has been consistent with workout routine and cut back on salty foods and began taking his blood pressure medications regularly.  Renal function and electrolytes were within the normal range on last BMP one year ago.  - continue hydrochlorothiazide 12.5 mg qd and lisinopril 40 mg qd  - follow up BMP today   Tobacco use  Smoking 1/2 PPD. At work he smokes during two breaks but is used to smoking after meals. Years ago he was able to quit when he had strep throat, 1 week after the symptoms resolved he resumed  smoking. He has not tried patches or nicorette gum. He would prefer to continue trying things on his own and would like to talk about it again next office visit. We discussed the significant risk of cardiovascular event from continued tobacco use.  Contemplative. Will continue to support and monitor.    Chronic combined congestive heart failure  Last echo 05/2017 EF 40-45%, G2 DD improved from 02/2016 EF 35-40% Myoview 05/2017 was intermediate risk because he developed a LBBB during stress  Denies dyspnea on exertion, orthopnea, PND, peripheral edema. He is due for follow up with cardiology, I am curious if he may need further ischemic evaluation with cardiac catheterization given his intermediate risk myoview.  - continue metoprolol tartrate 35 mg BID and lisinopril  - LDL 57 05/2017, continue atorvastatin 40   - continue aspirin 81 mg daily   Preventative health  - declines flu or pneumonia vaccine   See Encounters Tab for problem based charting.  Patient discussed with Dr. Dareen Piano

## 2018-02-27 NOTE — Assessment & Plan Note (Signed)
BP Readings from Last 3 Encounters:  02/27/18 112/82  06/26/17 124/86  05/30/17 (!) 141/78  Has been consistent with workout routine and cut back on salty foods and began taking his blood pressure medications regularly.  Renal function and electrolytes were within the normal range on last BMP one year ago.  - continue hydrochlorothiazide 12.5 mg qd and lisinopril 40 mg qd  - follow up BMP today

## 2018-02-27 NOTE — Assessment & Plan Note (Signed)
Today I noticed a pulsatile mid abdominal mass on Sean Leblanc. Because of his age and history of tobacco use I feel that we should evaluate for abdominal aortic aneurysm. I have discussed this concern with Mr. Wesch and he is agreeable with proceeding to abdominal ultrasound.

## 2018-02-27 NOTE — Patient Instructions (Addendum)
Thank you for coming to the clinic today. It was a pleasure to see you.   For your high blood pressure - keep up the fantastic work with eating foods low in salt, exercising, and taking your blood pressure medication   Please schedule a follow up appointment with cardiology   Let me know when you are ready to quit smoking   FOLLOW-UP INSTRUCTIONS When: 6 months - 1 year with Dr. Hetty Ely   For: follow up of your blood pressure  What to bring: all of your medication bottles   Please call the internal medicine center clinic if you have any questions or concerns, we may be able to help and keep you from a long and expensive emergency room wait. Our clinic and after hours phone number is [phone], the best time to call is Monday through Friday 9 am to 4 pm but there is always someone available 24/7 if you have an emergency. If you need medication refills please notify your pharmacy one week in advance and they will send [place] a request.

## 2018-02-28 LAB — BMP8+ANION GAP
ANION GAP: 16 mmol/L (ref 10.0–18.0)
BUN/Creatinine Ratio: 22 — ABNORMAL HIGH (ref 9–20)
BUN: 25 mg/dL — AB (ref 6–24)
CALCIUM: 9.7 mg/dL (ref 8.7–10.2)
CO2: 24 mmol/L (ref 20–29)
CREATININE: 1.15 mg/dL (ref 0.76–1.27)
Chloride: 103 mmol/L (ref 96–106)
GFR calc Af Amer: 81 mL/min/{1.73_m2} (ref >59)
GFR calc non Af Amer: 70 mL/min/{1.73_m2} (ref >59)
Glucose: 77 mg/dL (ref 65–99)
Potassium: 4.7 mmol/L (ref 3.5–5.2)
SODIUM: 143 mmol/L (ref 134–144)

## 2018-03-01 NOTE — Progress Notes (Signed)
Internal Medicine Clinic Attending  Case discussed with Dr. Blum at the time of the visit.  We reviewed the resident's history and exam and pertinent patient test results.  I agree with the assessment, diagnosis, and plan of care documented in the resident's note. 

## 2018-03-14 MED FILL — METOPROLOL TARTRATE 25 MG T: 25 | 30 days supply | Qty: 60 | Fill #4

## 2018-03-14 MED FILL — HYDROCHLOROTHIAZIDE 12.5 MG: 12.5 | 30 days supply | Qty: 30 | Fill #2

## 2018-03-21 MED FILL — ATORVASTATIN 40 MG TABLET: 40 | 30 days supply | Qty: 30 | Fill #6

## 2018-03-21 MED FILL — LISINOPRIL 40 MG TABLET: 40 | 30 days supply | Qty: 30 | Fill #3

## 2018-04-17 ENCOUNTER — Ambulatory Visit (HOSPITAL_COMMUNITY): Admission: RE | Admit: 2018-04-17 | Payer: 59 | Source: Ambulatory Visit

## 2018-04-18 MED FILL — METOPROLOL TARTRATE 25 MG T: 25 | 90 days supply | Qty: 180 | Fill #5

## 2018-04-18 MED FILL — HYDROCHLOROTHIAZIDE 12.5 MG: 12.5 | 90 days supply | Qty: 90 | Fill #3

## 2018-05-03 MED FILL — LISINOPRIL 40 MG TABLET: 40 | 30 days supply | Qty: 30 | Fill #4

## 2018-05-03 MED FILL — ATORVASTATIN 40 MG TABLET: 40 | 30 days supply | Qty: 30 | Fill #7

## 2018-06-14 ENCOUNTER — Other Ambulatory Visit: Payer: Self-pay | Admitting: Internal Medicine

## 2018-06-14 MED FILL — ATORVASTATIN 40 MG TABLET: 40 | 30 days supply | Qty: 30 | Fill #0

## 2018-06-14 MED FILL — LISINOPRIL 40 MG TABLET: 40 | 30 days supply | Qty: 30 | Fill #5

## 2018-07-26 MED FILL — ATORVASTATIN 40 MG TABLET: 40 | 30 days supply | Qty: 30 | Fill #1

## 2018-07-26 MED FILL — LISINOPRIL 40 MG TABLET: 40 | 30 days supply | Qty: 30 | Fill #6

## 2018-07-26 MED FILL — METOPROLOL TARTRATE 25 MG T: 25 | 90 days supply | Qty: 180 | Fill #6

## 2018-08-13 NOTE — Progress Notes (Addendum)
   CC: follow up of hypertension   HPI:  [name] is a 58 y.o. with PMH combined systolic and diastolic heart failure, hypertension, tobacco use and transient ischemic attack who presents for follow up of hypertension. Please see the assessment and plans for the status of the patient chronic medical problems.   Review of Systems: Refer to history of present illness and assessment and plans for pertinent review of systems, all others reviewed and negative  Physical Exam:  Vitals:   08/14/18 1356  BP: (!) 158/96  Pulse: (!) 54  Temp: 98.5 F (36.9 C)  TempSrc: Oral  SpO2: 100%  Weight: 143 lb 12.8 oz (65.2 kg)  Height: 6' (1.829 m)   General: well appearing, no acute distress  Cardiac: regular rate and rhythm, no murmur, no peripheral edema  Pulm: normal work of breathing, lungs clear to auscultation, no wheezes or rhonchi   Assessment & Plan:   Pulsatile abdominal mass  At last office visit 6 months ago I noticed a pulsatile abdominal mass on Sean Leblanc. Because of his age and history of tobacco use I felt he should be evaluated for an abdominal aortic aneurysm and ordered an abdominal ultrasound. Unfortunately he missed the appointment for AAA screening. We discussed why the ultrasound is important and I encouraged Sean Leblanc to reschedule the screening exam if he found the time to do so.   Hypertension  Tobacco use  Patient is here for follow up of hypertension. Comorbidity includes chronic combined heart failure. At last office visit his blood pressure was well controlled. Blood pressure is elevated above the goal range today but with review of past readings this likely represents an outlier. He is currently prescribed hctz 12.5 mg qd, and metoprolol tartrate 25 mg BID and lisinopril 40 mg qd. Last BMP was six months ago and included reduced renal function which may be representative of new CKD vs transient worsening and normal electrolytes.  - follow up BMP at next office  visit  - continue to encourage tobacco cessation, Sean Leblanc is not ready to talk about this today but will let us know when he is ready to do so  Cardiomyopathy  Last echo 05/2017 EF 40-45%, G2 DD improved from 02/2016 EF 35-40% Myoview 05/2017 was intermediate risk because he developed a LBBB during stress  He may need further ischemic workup but has not had a follow up with cardiology in a while, I will continue to encourage him to schedule follow up.  - Aspirin 81 mg daily, metoprolol tartrate 25 mg BID and lisinopril  - LDL 57 05/2017, continue atorvastatin 40    Preventative health  - will provide fecal occult blood testing cards today  - flu vaccine today   See Encounters Tab for problem based charting.  Patient discussed with Dr. Angelia Mould

## 2018-08-14 ENCOUNTER — Other Ambulatory Visit: Payer: Self-pay

## 2018-08-14 ENCOUNTER — Ambulatory Visit (INDEPENDENT_AMBULATORY_CARE_PROVIDER_SITE_OTHER): Payer: 59 | Admitting: Internal Medicine

## 2018-08-14 ENCOUNTER — Encounter: Payer: Self-pay | Admitting: Internal Medicine

## 2018-08-14 VITALS — BP 145/88 | HR 49 | Temp 98.5°F | Ht 72.0 in | Wt 143.8 lb

## 2018-08-14 DIAGNOSIS — I447 Left bundle-branch block, unspecified: Secondary | ICD-10-CM

## 2018-08-14 DIAGNOSIS — Z23 Encounter for immunization: Secondary | ICD-10-CM

## 2018-08-14 DIAGNOSIS — I110 Hypertensive heart disease with heart failure: Secondary | ICD-10-CM | POA: Diagnosis not present

## 2018-08-14 DIAGNOSIS — Z0000 Encounter for general adult medical examination without abnormal findings: Secondary | ICD-10-CM

## 2018-08-14 DIAGNOSIS — Z1211 Encounter for screening for malignant neoplasm of colon: Secondary | ICD-10-CM

## 2018-08-14 DIAGNOSIS — Z720 Tobacco use: Secondary | ICD-10-CM

## 2018-08-14 DIAGNOSIS — I5042 Chronic combined systolic (congestive) and diastolic (congestive) heart failure: Secondary | ICD-10-CM

## 2018-08-14 DIAGNOSIS — E7849 Other hyperlipidemia: Secondary | ICD-10-CM

## 2018-08-14 DIAGNOSIS — I10 Essential (primary) hypertension: Secondary | ICD-10-CM

## 2018-08-14 DIAGNOSIS — Z79899 Other long term (current) drug therapy: Secondary | ICD-10-CM

## 2018-08-14 DIAGNOSIS — Z8673 Personal history of transient ischemic attack (TIA), and cerebral infarction without residual deficits: Secondary | ICD-10-CM

## 2018-08-14 DIAGNOSIS — I429 Cardiomyopathy, unspecified: Secondary | ICD-10-CM

## 2018-08-14 DIAGNOSIS — R1900 Intra-abdominal and pelvic swelling, mass and lump, unspecified site: Secondary | ICD-10-CM

## 2018-08-14 DIAGNOSIS — Z7982 Long term (current) use of aspirin: Secondary | ICD-10-CM

## 2018-08-14 NOTE — Patient Instructions (Signed)
Thank you for coming to the clinic today. It was a pleasure to see you.   Please stop by the lab on your way out, stop by tthe lab   Please schedule a follow up with your cardiologist Dr. Martinique when you have a chance   FOLLOW-UP INSTRUCTIONS When: 6 months with your new primary care provider   For: Follow up of your blood pressure  What to bring: all of your medication bottles   Please call the internal medicine center clinic if you have any questions or concerns, we may be able to help and keep you from a long and expensive emergency room wait. Our clinic and after hours phone number is [phone], the best time to call is Monday through Friday 9 am to 4 pm but there is always someone available 24/7 if you have an emergency. If you need medication refills please notify your pharmacy one week in advance and they will send [place] a request.

## 2018-08-16 ENCOUNTER — Encounter: Payer: Self-pay | Admitting: Internal Medicine

## 2018-08-16 NOTE — Assessment & Plan Note (Signed)
Last echo 05/2017 EF 40-45%, G2 DD improved from 02/2016 EF 35-40% Myoview 05/2017 was intermediate risk because he developed a LBBB during stress  He may need further ischemic workup but has not had a follow up with cardiology in a while, I will continue to encourage him to schedule follow up.  - Aspirin 81 mg daily, metoprolol tartrate 25 mg BID and lisinopril  - LDL 57 05/2017, continue atorvastatin 40

## 2018-08-16 NOTE — Assessment & Plan Note (Signed)
-   will provide fecal occult blood testing cards today  - flu vaccine today

## 2018-08-16 NOTE — Assessment & Plan Note (Signed)
Reporting good statin compliance without side effect. LDL 57 05/2017. - continue atorvastatin 40 mg daily

## 2018-08-16 NOTE — Assessment & Plan Note (Signed)
[name] is precontemplative about tobacco cessation.  - continue to encourage tobacco cessation, [name] is not ready to talk about this today but will let us know when he is ready to do so

## 2018-08-16 NOTE — Assessment & Plan Note (Signed)
Patient is here for follow up of hypertension. Comorbidity includes chronic combined heart failure. At last office visit his blood pressure was well controlled. He is currently prescribed hctz 12.5 mg qd, and metoprolol tartrate 25 mg BID and lisinopril 40 mg qd. Last BMP was six months ago and included reduced renal function which may be representative of new CKD vs transient worsening and normal electrolytes.  - follow up BMP at next office visit  - continue to encourage tobacco cessation, Mr. Hippe is not ready to talk about this today but will let us know when he is ready to do so

## 2018-08-16 NOTE — Assessment & Plan Note (Signed)
At last office visit 6 months ago I noticed a pulsatile abdominal mass on Sean Leblanc. Because of his age and history of tobacco use I felt he should be evaluated for an abdominal aortic aneurysm and ordered an abdominal ultrasound. Unfortunately he missed the appointment for AAA screening. We discussed why the ultrasound is important and I encouraged Sean Leblanc to reschedule the screening exam if he found the time to do so.

## 2018-08-17 NOTE — Progress Notes (Signed)
Internal Medicine Clinic Attending  Case discussed with Dr. Blum at the time of the visit.  We reviewed the resident's history and exam and pertinent patient test results.  I agree with the assessment, diagnosis, and plan of care documented in the resident's note. 

## 2018-08-23 MED FILL — HYDROCHLOROTHIAZIDE 12.5 MG: 12.5 | 90 days supply | Qty: 90 | Fill #4

## 2018-08-28 ENCOUNTER — Encounter: Payer: 59 | Admitting: Internal Medicine

## 2018-09-06 MED FILL — ATORVASTATIN 40 MG TABLET: 40 | 30 days supply | Qty: 30 | Fill #2 | Status: TO

## 2018-09-06 MED FILL — LISINOPRIL 40 MG TABLET: 40 | 30 days supply | Qty: 30 | Fill #7 | Status: TO

## 2018-10-11 MED FILL — ATORVASTATIN 40 MG TABLET: 40 | 30 days supply | Qty: 30 | Fill #0

## 2018-10-11 MED FILL — LISINOPRIL 40 MG TABLET: 40 | 30 days supply | Qty: 30 | Fill #0

## 2018-11-16 ENCOUNTER — Other Ambulatory Visit: Payer: Self-pay

## 2018-11-16 DIAGNOSIS — I5042 Chronic combined systolic (congestive) and diastolic (congestive) heart failure: Secondary | ICD-10-CM

## 2018-11-16 MED ORDER — METOPROLOL TARTRATE 25 MG PO TABS: 25.0000 mg | ORAL_TABLET | Freq: Two times a day (BID) | ORAL | 3 refills | Status: DC

## 2018-11-16 NOTE — Telephone Encounter (Signed)
metoprolol tartrate (LOPRESSOR) 25 MG tablet, refill request @ [facility]. Pt would like this med by today.

## 2019-01-03 ENCOUNTER — Encounter: Payer: Self-pay | Admitting: *Deleted

## 2019-01-21 ENCOUNTER — Telehealth: Payer: Self-pay | Admitting: Radiation Oncology

## 2019-01-21 MED FILL — ATORVASTATIN 40 MG TABLET: 40 | 30 days supply | Qty: 30 | Fill #0

## 2019-01-21 MED FILL — METOPROLOL TARTRATE 25 MG T: 25 | 90 days supply | Qty: 180 | Fill #0

## 2019-01-21 NOTE — Telephone Encounter (Signed)
Confirmed with [facility] that patient has refill left on atorvastatin and they can pull refill on metoprolol from [place] that was sent on 11/16/2018. They will get these meds ready for patient. Hubbard Hartshorn, RN, BSN

## 2019-01-21 NOTE — Telephone Encounter (Addendum)
Needs refills on atorvastatin (LIPITOR) 40 MG tablet metoprolol tartrate (LOPRESSOR) 25 MG tablet   [place], [place] - [address] [facility]. ;pt contact [phone]  Pt hasn't took medicine since May, pls contact pt [phone]

## 2019-01-31 MED FILL — ATORVASTATIN 40 MG TABLET: 40 | 30 days supply | Qty: 30 | Fill #0

## 2019-01-31 MED FILL — METOPROLOL TARTRATE 25 MG T: 25 | 90 days supply | Qty: 180 | Fill #0

## 2019-02-11 NOTE — Progress Notes (Signed)
   CC: high blood pressure and tobacco use   HPI:  [name] is a 58 y.o. M here for follow up of his chronic medical conditions.   Past Medical History:  Diagnosis Date  . Cardiomyopathy ([place])   . Hypertension   . TIA (transient ischemic attack)    Review of Systems:    Review of Systems  Constitutional: Negative for chills, fever, malaise/fatigue and weight loss.  Respiratory: Negative for shortness of breath.   Cardiovascular: Negative for leg swelling.  Gastrointestinal: Negative for abdominal pain and blood in stool.  Neurological: Negative for loss of consciousness.   Physical Exam:  Vitals:   02/12/19 1430  BP: (!) 157/88  Pulse: (!) 57  Temp: 98.9 F (37.2 C)  TempSrc: Oral  SpO2: 99%  Weight: 147 lb 12.8 oz (67 kg)    Physical Exam  Constitutional: He is oriented to person, place, and time. He appears well-developed and well-nourished. No distress.  Thin appearing male  Cardiovascular: Normal rate, regular rhythm and normal heart sounds.  No murmur heard. Pulmonary/Chest: Effort normal and breath sounds normal. No respiratory distress. He exhibits no tenderness.  Abdominal: Soft. Bowel sounds are normal. He exhibits no distension. There is no abdominal tenderness.  Pulsatile aorta   Musculoskeletal: Normal range of motion.  Neurological: He is alert and oriented to person, place, and time.  Skin: Skin is warm and dry. No rash noted. He is not diaphoretic. No erythema.  Psychiatric: He has a normal mood and affect.  Nursing note and vitals reviewed.   Assessment & Plan:   See Encounters Tab for problem based charting.  Patient seen with Dr. Evette Doffing

## 2019-02-11 NOTE — Patient Instructions (Addendum)
Thank you for coming to your appointment. It was so nice to meet you. Today we discussed  Jens was seen today for medication refill and medical management of chronic issues.  Diagnoses and all orders for this visit:  Essential hypertension - medications refilled and sent to pharmacy  Tobacco abuse - keep continuing to decrease the amount you smoke and let us know how we can help! We will discuss adding medication to help you quit at your next visit.   Pulsatile mass - we did an ultrasound of your stomach to look at your aorta which was a normal size    Please keep up the good work. I look forward to seeing you again soon at your follow up in 1 month.  Sincerely,  Al Decant, MD

## 2019-02-12 ENCOUNTER — Encounter (INDEPENDENT_AMBULATORY_CARE_PROVIDER_SITE_OTHER): Payer: Self-pay

## 2019-02-12 ENCOUNTER — Other Ambulatory Visit: Payer: Self-pay

## 2019-02-12 ENCOUNTER — Ambulatory Visit (INDEPENDENT_AMBULATORY_CARE_PROVIDER_SITE_OTHER): Payer: 59 | Admitting: Radiation Oncology

## 2019-02-12 ENCOUNTER — Encounter: Payer: Self-pay | Admitting: Radiation Oncology

## 2019-02-12 VITALS — BP 164/96 | HR 52 | Temp 98.9°F | Wt 147.8 lb

## 2019-02-12 DIAGNOSIS — Z720 Tobacco use: Secondary | ICD-10-CM | POA: Diagnosis not present

## 2019-02-12 DIAGNOSIS — R1900 Intra-abdominal and pelvic swelling, mass and lump, unspecified site: Secondary | ICD-10-CM

## 2019-02-12 DIAGNOSIS — I10 Essential (primary) hypertension: Secondary | ICD-10-CM | POA: Diagnosis not present

## 2019-02-12 DIAGNOSIS — Z79899 Other long term (current) drug therapy: Secondary | ICD-10-CM

## 2019-02-12 MED ORDER — HYDROCHLOROTHIAZIDE 12.5 MG PO CAPS
12.5000 mg | ORAL_CAPSULE | Freq: Every day | ORAL | 3 refills | Status: DC
Start: 2019-02-12 — End: 2019-09-03

## 2019-02-12 MED ORDER — LISINOPRIL 40 MG PO TABS: 40.0000 mg | ORAL_TABLET | Freq: Every day | ORAL | 3 refills | Status: DC

## 2019-02-12 MED FILL — LISINOPRIL 40 MG TABS: 40 | 90 days supply | Qty: 90 | Fill #0

## 2019-02-12 MED FILL — HYDROCHLOROTHIAZIDE 12.5 MG: 12.5 | 90 days supply | Qty: 90 | Fill #0

## 2019-02-12 NOTE — Assessment & Plan Note (Signed)
Today's Vitals   02/12/19 1430 02/12/19 1456  BP: (!) 157/88 (!) 164/96  Pulse: (!) 57 (!) 52  Temp: 98.9 F (37.2 C)   TempSrc: Oral   SpO2: 99%   Weight: 147 lb 12.8 oz (67 kg)    Body mass index is 20.05 kg/m.  Patient with history of hypertension who has not been taking his blood pressure medications due to confusion regarding which pharmacy to pick them up from.  -Reordered blood pressure medications -fu in 1 month to recheck bp

## 2019-02-12 NOTE — Assessment & Plan Note (Addendum)
Pt with history of hypertension and smoking was found to have a pulsatile mass on a prior physical exam. He is thin but does have risk factors for AAA including smoking, hypertension, male gender. He did not wish to proceed with a formal US so a POCUS today showed a stable 2 cm aorta with no evidence of AAA.  - encouraged smoking cessation

## 2019-02-12 NOTE — Assessment & Plan Note (Signed)
Patient with history of tobacco use, currently smoking 1-2 cigarettes a day down from 3-4. Discussed smoking cessation and ways to assist including nicotine replacement, counseling, behavioral changes and medication assistance. He has considered those and wants to think about them. - encouraged smoking cessation - will fu his interest in medication assistance at his fu in 1 mo

## 2019-02-13 LAB — BMP8+ANION GAP
Anion Gap: 18 mmol/L (ref 10.0–18.0)
BUN/Creatinine Ratio: 13 (ref 9–20)
BUN: 12 mg/dL (ref 6–24)
CO2: 20 mmol/L (ref 20–29)
Calcium: 9.6 mg/dL (ref 8.7–10.2)
Chloride: 103 mmol/L (ref 96–106)
Creatinine, Ser: 0.92 mg/dL (ref 0.76–1.27)
GFR calc Af Amer: 106 mL/min/{1.73_m2} (ref >59)
GFR calc non Af Amer: 91 mL/min/{1.73_m2} (ref >59)
Glucose: 73 mg/dL (ref 65–99)
Potassium: 4 mmol/L (ref 3.5–5.2)
Sodium: 141 mmol/L (ref 134–144)

## 2019-02-13 NOTE — Progress Notes (Signed)
Internal Medicine Clinic Attending  I saw and evaluated the patient.  I personally confirmed the key portions of the history and exam documented by Dr. Madilyn Fireman and I reviewed pertinent patient test results.  The assessment, diagnosis, and plan were formulated together and I agree with the documentation in the resident's note.  Plan is to restart lisinopril and hctz to manage his chronic hypertension.

## 2019-03-19 ENCOUNTER — Encounter: Payer: 59 | Admitting: Radiation Oncology

## 2019-03-26 ENCOUNTER — Encounter: Payer: 59 | Admitting: Radiation Oncology

## 2019-04-01 ENCOUNTER — Telehealth: Payer: Self-pay | Admitting: Radiation Oncology

## 2019-04-09 ENCOUNTER — Encounter: Payer: 59 | Admitting: Radiation Oncology

## 2019-04-16 ENCOUNTER — Encounter: Payer: 59 | Admitting: Radiation Oncology

## 2019-04-16 ENCOUNTER — Encounter: Payer: Self-pay | Admitting: Radiation Oncology

## 2019-05-08 MED FILL — ATORVASTATIN 40 MG TABLET: 40 | 30 days supply | Qty: 30 | Fill #1

## 2019-05-08 MED FILL — METOPROLOL TARTRATE 25 MG T: 25 | 90 days supply | Qty: 180 | Fill #1

## 2019-05-30 MED FILL — HYDROCHLOROTHIAZIDE 12.5 MG: 12.5 | 90 days supply | Qty: 90 | Fill #1

## 2019-05-30 MED FILL — LISINOPRIL 40 MG TABLET: 40 | 90 days supply | Qty: 90 | Fill #1

## 2019-08-20 MED FILL — METOPROLOL TARTRATE 25 MG T: 25 | 90 days supply | Qty: 180 | Fill #2

## 2019-08-23 ENCOUNTER — Other Ambulatory Visit: Payer: Self-pay | Admitting: *Deleted

## 2019-08-26 MED ORDER — ATORVASTATIN CALCIUM 40 MG PO TABS: 40.0000 mg | ORAL_TABLET | Freq: Every day | ORAL | 0 refills | Status: DC

## 2019-08-26 MED FILL — ATORVASTATIN 40 MG TABLET: 40 | 30 days supply | Qty: 30 | Fill #0

## 2019-08-26 NOTE — Telephone Encounter (Signed)
Called patient voice mail box not set up to take messages.  Will try again.

## 2019-08-26 NOTE — Telephone Encounter (Signed)
Needs appt next 30 days.PCP or [facility] (add lanier to comments as she is in [facility])

## 2019-08-26 NOTE — Telephone Encounter (Signed)
Per dr [facility] pt needs appt within 30 days

## 2019-09-03 ENCOUNTER — Ambulatory Visit (INDEPENDENT_AMBULATORY_CARE_PROVIDER_SITE_OTHER): Payer: 59 | Admitting: Radiation Oncology

## 2019-09-03 ENCOUNTER — Other Ambulatory Visit: Payer: Self-pay | Admitting: Radiation Oncology

## 2019-09-03 ENCOUNTER — Encounter: Payer: Self-pay | Admitting: Radiation Oncology

## 2019-09-03 VITALS — BP 167/108 | HR 70 | Temp 98.9°F | Ht 72.0 in | Wt 151.2 lb

## 2019-09-03 DIAGNOSIS — I10 Essential (primary) hypertension: Secondary | ICD-10-CM

## 2019-09-03 DIAGNOSIS — Z23 Encounter for immunization: Secondary | ICD-10-CM | POA: Diagnosis not present

## 2019-09-03 DIAGNOSIS — Z79899 Other long term (current) drug therapy: Secondary | ICD-10-CM

## 2019-09-03 DIAGNOSIS — Z720 Tobacco use: Secondary | ICD-10-CM | POA: Diagnosis not present

## 2019-09-03 DIAGNOSIS — Z0000 Encounter for general adult medical examination without abnormal findings: Secondary | ICD-10-CM

## 2019-09-03 MED ORDER — CHANTIX STARTING MONTH PAK 0.5 MG X 11 & 1 MG X 42 PO TABS: ORAL_TABLET | ORAL | 0 refills | Status: DC

## 2019-09-03 MED ORDER — CHLORTHALIDONE 25 MG PO TABS: 25.0000 mg | ORAL_TABLET | Freq: Every day | ORAL | 11 refills | Status: DC

## 2019-09-03 MED ORDER — VARENICLINE TARTRATE 1 MG PO TABS: 1.0000 mg | ORAL_TABLET | Freq: Two times a day (BID) | ORAL | 3 refills | Status: DC

## 2019-09-03 MED FILL — CHLORTHALIDONE 25 MG TABS: 25 | 30 days supply | Qty: 30 | Fill #0

## 2019-09-03 NOTE — Assessment & Plan Note (Signed)
This is chronic and uncontrolled. Patient's Bps uncontrolled at last visit but stated he wasn't taking his medication regularly. Today his BP remains uncontrolled.  Today's Vitals   09/03/19 1429 09/03/19 1504  BP: (!) 156/111 (!) 167/108  Pulse: 74 70  Temp: 98.9 F (37.2 C)   TempSrc: Oral   SpO2: 99%   Weight: 151 lb 3.2 oz (68.6 kg)   Height: 6' (1.829 m)   PainSc: 0-No pain    Body mass index is 20.51 kg/m.  Plan: -d/c HCTZ 12.5 daily -start chlorthalidone 25 mg daily -fu in 4 weeks for BP recheck

## 2019-09-03 NOTE — Assessment & Plan Note (Addendum)
Patient with hx of tobacco use now smoking 0.5 PPD. At last visit he stated he would think about medication assistance for tobacco cessation. We discussed options including chantix or buproprion w/ or without NRT. Patient would like to start chantix.  Plan: -ordered chantix starter pack and continuation pack

## 2019-09-03 NOTE — Progress Notes (Signed)
   CC: high blood pressure  HPI:  [name] is a 59 y.o. male who presents to the Internal Medicine Clinic for high blood pressure and for follow up of his chronic medical problems. Please see Assessment and Plan for full HPI.  Past Medical History:  Diagnosis Date  . Cardiomyopathy ([place])   . Hypertension   . TIA (transient ischemic attack)    Review of Systems:  Please see Assessment and Plan for full ROS.  Physical Exam:  Vitals:   09/03/19 1429  BP: (!) 156/111  Pulse: 74  Temp: 98.9 F (37.2 C)  TempSrc: Oral  SpO2: 99%  Weight: 151 lb 3.2 oz (68.6 kg)  Height: 6' (1.829 m)   Physical Exam  Constitutional: He is well-developed, well-nourished, and in no distress. No distress.  HENT:  Head: Normocephalic and atraumatic.  Cardiovascular: Normal rate, regular rhythm and normal heart sounds.  No murmur heard. Pulmonary/Chest: Effort normal and breath sounds normal. No respiratory distress.  Abdominal: Bowel sounds are normal. He exhibits no distension.  Musculoskeletal:        General: Normal range of motion.     Cervical back: Normal range of motion.  Neurological: He is alert.  Skin: Skin is warm and dry. He is not diaphoretic.  Psychiatric: Affect normal.  Nursing note and vitals reviewed.   Assessment & Plan:   See Encounters Tab for problem based charting.  Patient discussed with Dr. Heber [place]

## 2019-09-03 NOTE — Patient Instructions (Addendum)
Thank you for coming to your appointment. It was so nice to see you. Today we discussed  Ossian was seen today for follow-up.  Diagnoses and all orders for this visit:  Essential hypertension -     chlorthalidone (HYGROTON) 25 MG tablet; Take 1 tablet (25 mg total) by mouth daily.  Tobacco abuse -     varenicline (CHANTIX STARTING MONTH PAK) 0.5 MG X 11 & 1 MG X 42 tablet; Take one 0.5 mg tablet by mouth once daily for 3 days, then increase to one 0.5 mg tablet twice daily for 4 days, then increase to one 1 mg tablet twice daily. -     varenicline (CHANTIX CONTINUING MONTH PAK) 1 MG tablet; Take 1 tablet (1 mg total) by mouth 2 (two) times daily.  Healthcare maintenance -     Fecal occult blood, imunochemical; Future     Sincerely,  Al Decant, MD

## 2019-09-03 NOTE — Assessment & Plan Note (Signed)
Patient due for FOBT. Discussed colonoscopy and patient would prefer another FOBT.  Patient amenable to flu vaccine today.  Plan: -FOBT ordered -flu vaccine given

## 2019-09-04 NOTE — Progress Notes (Signed)
Internal Medicine Clinic Attending  Case discussed with Dr. Lanierat the time of the visit.  We reviewed the resident's history and exam and pertinent patient test results.  I agree with the assessment, diagnosis, and plan of care documented in the resident's note.   

## 2019-09-18 MED FILL — LISINOPRIL 40 MG TABLET: 40 | 90 days supply | Qty: 90 | Fill #2

## 2019-10-01 ENCOUNTER — Other Ambulatory Visit: Payer: Self-pay

## 2019-10-01 ENCOUNTER — Ambulatory Visit: Payer: 59 | Admitting: Radiation Oncology

## 2019-10-01 VITALS — BP 128/84 | HR 70 | Temp 98.2°F | Ht 72.0 in | Wt 145.1 lb

## 2019-10-01 DIAGNOSIS — Z0000 Encounter for general adult medical examination without abnormal findings: Secondary | ICD-10-CM

## 2019-10-01 DIAGNOSIS — I10 Essential (primary) hypertension: Secondary | ICD-10-CM

## 2019-10-01 DIAGNOSIS — Z720 Tobacco use: Secondary | ICD-10-CM | POA: Diagnosis not present

## 2019-10-01 DIAGNOSIS — M25519 Pain in unspecified shoulder: Secondary | ICD-10-CM | POA: Diagnosis not present

## 2019-10-01 DIAGNOSIS — Z79899 Other long term (current) drug therapy: Secondary | ICD-10-CM

## 2019-10-01 NOTE — Patient Instructions (Signed)
Thank you for coming to your appointment. It was so nice to see you. Today we discussed  Trek was seen today for follow-up and hypertension.  Diagnoses and all orders for this visit:  Essential hypertension -     Blood work today  Tobacco abuse       -     Continue chantix, try and get to 0 cigarettes per week!   If labs were drawn today, I will call you with any abnormal results. Otherwise, please keep up the good work. I look forward to seeing you again soon at your follow up!  Sincerely,  Al Decant, MD

## 2019-10-01 NOTE — Progress Notes (Signed)
   CC: high blood pressure  HPI:  Sean Leblanc is a 59 y.o. male who presents to the Internal Medicine Clinic for a blood pressure follow up. Please see Assessment and Plan for full HPI.  Past Medical History:  Diagnosis Date  . Cardiomyopathy ([place])   . Hypertension   . TIA (transient ischemic attack)    Review of Systems:  Please see Assessment and Plan for full ROS.  Review of Systems  Constitutional: Negative for chills and fever.  HENT: Negative for congestion and sore throat.   Respiratory: Negative for shortness of breath.   Cardiovascular: Negative for chest pain.  Gastrointestinal: Negative for abdominal pain.  Musculoskeletal: Positive for joint pain (mild shoulder pain after work).  Neurological: Negative for dizziness and headaches.  Psychiatric/Behavioral: Negative for depression. The patient is not nervous/anxious.    Physical Exam:  Vitals:   10/01/19 1448  BP: 128/84  Pulse: 70  Temp: 98.2 F (36.8 C)  TempSrc: Oral  SpO2: 100%  Weight: 145 lb 1.6 oz (65.8 kg)  Height: 6' (1.829 m)   Physical Exam  Constitutional: He is oriented to person, place, and time. No distress.  Thin male  HENT:  Head: Normocephalic and atraumatic.  Cardiovascular: Normal rate, regular rhythm, normal heart sounds and intact distal pulses.  No murmur heard. Pulmonary/Chest: Effort normal and breath sounds normal. No respiratory distress. He exhibits no tenderness.  Abdominal: Soft. Bowel sounds are normal. He exhibits no distension. There is no abdominal tenderness.  Musculoskeletal:        General: No deformity. Normal range of motion.     Cervical back: Normal range of motion.  Neurological: He is alert and oriented to person, place, and time.  Skin: Skin is warm and dry. He is not diaphoretic. No erythema.  Psychiatric: Affect normal.  Nursing note and vitals reviewed.  Assessment & Plan:   See Encounters Tab for problem based charting.  Patient discussed with  Dr. Rebeca Alert

## 2019-10-02 ENCOUNTER — Encounter: Payer: Self-pay | Admitting: Radiation Oncology

## 2019-10-02 LAB — BMP8+ANION GAP
Anion Gap: 15 mmol/L (ref 10.0–18.0)
BUN/Creatinine Ratio: 26 — ABNORMAL HIGH (ref 9–20)
BUN: 29 mg/dL — ABNORMAL HIGH (ref 6–24)
CO2: 22 mmol/L (ref 20–29)
Calcium: 9.8 mg/dL (ref 8.7–10.2)
Chloride: 102 mmol/L (ref 96–106)
Creatinine, Ser: 1.1 mg/dL (ref 0.76–1.27)
GFR calc Af Amer: 84 mL/min/{1.73_m2} (ref >59)
GFR calc non Af Amer: 73 mL/min/{1.73_m2} (ref >59)
Glucose: 83 mg/dL (ref 65–99)
Potassium: 4.5 mmol/L (ref 3.5–5.2)
Sodium: 139 mmol/L (ref 134–144)

## 2019-10-02 NOTE — Progress Notes (Signed)
Internal Medicine Clinic Attending  Case discussed with Dr. Lanier at the time of the visit.  We reviewed the resident's history and exam and pertinent patient test results.  I agree with the assessment, diagnosis, and plan of care documented in the resident's note.  Elfrieda Espino, M.D., Ph.D.  

## 2019-10-02 NOTE — Assessment & Plan Note (Signed)
This is chronic and well controlled.  Recently switched from HCTZ to Chlorthalidone with good response.   Vitals:   10/01/19 1448  BP: 128/84  Pulse: 70  Temp: 98.2 F (36.8 C)  SpO2: 100%   Tolerating new medication well. No side effects such as dizziness or lightheadedness.  Plan: -continue chlorthalidone -BMP todahy

## 2019-10-02 NOTE — Assessment & Plan Note (Signed)
This is chronic.  Patient recently started Chantix for smoking cessation and he has decreased to a half a pack per week. No side effects. Tolerating medication well. No mood changes.  Plan: -continue Chantix

## 2019-10-02 NOTE — Assessment & Plan Note (Signed)
Due for pneumonia vaccine but patient scheduled to receive COVID-19 vaccine later this week so we will postpone until his next visit.

## 2019-11-15 ENCOUNTER — Encounter: Payer: Self-pay | Admitting: *Deleted

## 2019-11-19 ENCOUNTER — Other Ambulatory Visit: Payer: Self-pay | Admitting: Internal Medicine

## 2019-11-20 ENCOUNTER — Other Ambulatory Visit: Payer: Self-pay | Admitting: Radiation Oncology

## 2019-11-20 DIAGNOSIS — I5042 Chronic combined systolic (congestive) and diastolic (congestive) heart failure: Secondary | ICD-10-CM

## 2019-11-26 ENCOUNTER — Telehealth: Payer: Self-pay | Admitting: *Deleted

## 2019-11-26 NOTE — Telephone Encounter (Signed)
11/26/2019  15:45  I tried to call Sean Leblanc in reference to IFOBT recollection No answer. Voicemail not setup  Maryan Rued, PBT Clinic Lab

## 2019-12-26 MED FILL — LISINOPRIL 40 MG TABS: 40 | 90 days supply | Qty: 90 | Fill #3

## 2020-03-05 MED FILL — METOPROLOL TARTRATE 25 MG T: 25 | 30 days supply | Qty: 60 | Fill #1

## 2020-03-17 MED FILL — METOPROLOL TARTRATE 25 MG T: 25 | 90 days supply | Qty: 180 | Fill #1

## 2020-03-18 ENCOUNTER — Other Ambulatory Visit: Payer: Self-pay | Admitting: *Deleted

## 2020-03-18 ENCOUNTER — Other Ambulatory Visit: Payer: Self-pay | Admitting: Internal Medicine

## 2020-03-18 MED ORDER — ATORVASTATIN CALCIUM 40 MG PO TABS: 40.0000 mg | ORAL_TABLET | Freq: Every day | ORAL | 2 refills | Status: DC

## 2020-03-18 MED FILL — ATORVASTATIN 40 MG TABLET: 40 | 30 days supply | Qty: 30 | Fill #0

## 2020-04-16 ENCOUNTER — Other Ambulatory Visit: Payer: Self-pay

## 2020-04-16 ENCOUNTER — Other Ambulatory Visit: Payer: Self-pay | Admitting: Internal Medicine

## 2020-04-16 DIAGNOSIS — I10 Essential (primary) hypertension: Secondary | ICD-10-CM

## 2020-04-16 MED ORDER — LISINOPRIL 40 MG PO TABS: 40.0000 mg | ORAL_TABLET | Freq: Every day | ORAL | 3 refills | Status: DC

## 2020-04-16 MED FILL — LISINOPRIL 40 MG TABS: 40 | 90 days supply | Qty: 90 | Fill #0

## 2020-04-16 NOTE — Telephone Encounter (Signed)
lisinopril (ZESTRIL) 40 MG tablet(Expired, REFILL REQUEST @  [place], [place] - [address] [facility]. Phone:  [phone]  Fax:  [phone]

## 2020-04-17 MED FILL — ATORVASTATIN 40 MG TABLET: 40 | 30 days supply | Qty: 30 | Fill #1

## 2020-05-20 ENCOUNTER — Other Ambulatory Visit: Payer: Self-pay

## 2020-05-20 ENCOUNTER — Ambulatory Visit (INDEPENDENT_AMBULATORY_CARE_PROVIDER_SITE_OTHER): Payer: 59 | Admitting: Student

## 2020-05-20 ENCOUNTER — Encounter: Payer: Self-pay | Admitting: Student

## 2020-05-20 VITALS — BP 129/87 | HR 58 | Temp 98.3°F | Ht 72.0 in | Wt 148.9 lb

## 2020-05-20 DIAGNOSIS — Z0000 Encounter for general adult medical examination without abnormal findings: Secondary | ICD-10-CM | POA: Diagnosis not present

## 2020-05-20 DIAGNOSIS — E7849 Other hyperlipidemia: Secondary | ICD-10-CM | POA: Diagnosis not present

## 2020-05-20 DIAGNOSIS — I10 Essential (primary) hypertension: Secondary | ICD-10-CM | POA: Diagnosis not present

## 2020-05-20 DIAGNOSIS — Z720 Tobacco use: Secondary | ICD-10-CM | POA: Diagnosis not present

## 2020-05-20 DIAGNOSIS — Z23 Encounter for immunization: Secondary | ICD-10-CM

## 2020-05-20 NOTE — Patient Instructions (Signed)
[name],  It was a pleasure seeing you in the clinic today.   We discussed the following today:  1. You got your flu shot.  2. Please perform the stool test for screening.  3. Please continue to stop smoking cigarettes.  Please call our clinic at [phone] if you have any questions or concerns. The best time to call is Monday-Friday from 9am-4pm, but there is someone available 24/7 at the same number. If you need medication refills, please notify your pharmacy one week in advance and they will send [place] a request.   Thank you for letting us take part in your care. We look forward to seeing you next time!

## 2020-05-20 NOTE — Assessment & Plan Note (Signed)
Chronic issue. Patient reports that he is continuing to use chantix for smoking cessation. Reports smoking about a pack a week. Counseled patient on health benefits of smoking cessation, especially in the context of his cardiac history. Reports tolerating chantix well (no mood changes or other adverse effects noted).  -continue chantix -continue smoking cessation counseling.

## 2020-05-20 NOTE — Progress Notes (Signed)
Internal Medicine Clinic Attending  I saw and evaluated the patient.  I personally confirmed the key portions of the history and exam documented by Dr. Allyson Sabal and I reviewed pertinent patient test results.  The assessment, diagnosis, and plan were formulated together and I agree with the documentation in the resident's note.  Lenice Pressman, M.D., Ph.D.

## 2020-05-20 NOTE — Progress Notes (Signed)
   CC: routine clinic visit  HPI:  [name] is a 59 y.o. male with history of TIA, HTN, HLD, and tobacco use disorder presenting for a routine clinic visit. Please see individualized A&P for full HPI.  Past Medical History:  Diagnosis Date  . Cardiomyopathy ([place])   . Hypertension   . TIA (transient ischemic attack)    Review of Systems:   Negative ROS.  Physical Exam:  Vitals:   05/20/20 0904  BP: 129/87  Pulse: (!) 58  Temp: 98.3 F (36.8 C)  TempSrc: Oral  SpO2: 99%  Weight: 148 lb 14.4 oz (67.5 kg)  Height: 6' (1.829 m)   Physical Exam Constitutional:      Appearance: Normal appearance. He is not ill-appearing.  HENT:     Head: Normocephalic and atraumatic.     Mouth/Throat:     Mouth: Mucous membranes are moist.     Pharynx: Oropharynx is clear. No oropharyngeal exudate.  Eyes:     Extraocular Movements: Extraocular movements intact.     Conjunctiva/sclera: Conjunctivae normal.     Pupils: Pupils are equal, round, and reactive to light.  Cardiovascular:     Rate and Rhythm: Normal rate and regular rhythm.     Pulses: Normal pulses.     Heart sounds: Normal heart sounds. No murmur heard.  No friction rub. No gallop.   Pulmonary:     Effort: Pulmonary effort is normal.     Breath sounds: Normal breath sounds. No wheezing, rhonchi or rales.  Abdominal:     General: Bowel sounds are normal. There is no distension.     Palpations: Abdomen is soft.     Tenderness: There is no abdominal tenderness. There is no guarding or rebound.  Musculoskeletal:        General: No swelling. Normal range of motion.     Cervical back: Normal range of motion.  Skin:    General: Skin is warm and dry.     Capillary Refill: Capillary refill takes less than 2 seconds.  Neurological:     General: No focal deficit present.     Mental Status: He is alert and oriented to person, place, and time.  Psychiatric:        Mood and Affect: Mood normal.        Behavior: Behavior  normal.      Assessment & Plan:   See Encounters Tab for problem based charting.  Patient seen with Dr. Rebeca Alert

## 2020-05-20 NOTE — Assessment & Plan Note (Signed)
Chronic and well controlled with Lipitor 40mg  daily.  -continue lipitor

## 2020-05-20 NOTE — Assessment & Plan Note (Signed)
Vitals:   05/20/20 0904  BP: 129/87  Pulse: (!) 58  Temp: 98.3 F (36.8 C)  SpO2: 99%   Patient with history of HTN, on lopressor 25mg , chlorthalidone 25mg , and lisinopril 40mg  daily. BP well controlled today at 129/87.   -continue current medications

## 2020-05-20 NOTE — Assessment & Plan Note (Addendum)
Will perform FIT testing to screen for colon cancer.  Received his flu shot today.

## 2020-05-28 MED FILL — ATORVASTATIN 40 MG TABLET: 40 | 30 days supply | Qty: 30 | Fill #2

## 2020-06-29 ENCOUNTER — Other Ambulatory Visit: Payer: Self-pay

## 2020-06-29 ENCOUNTER — Other Ambulatory Visit: Payer: 59

## 2020-06-29 DIAGNOSIS — Z0000 Encounter for general adult medical examination without abnormal findings: Secondary | ICD-10-CM

## 2020-06-30 ENCOUNTER — Other Ambulatory Visit: Payer: Self-pay

## 2020-06-30 ENCOUNTER — Other Ambulatory Visit (HOSPITAL_COMMUNITY): Payer: Self-pay | Admitting: Emergency Medicine

## 2020-06-30 ENCOUNTER — Emergency Department (HOSPITAL_COMMUNITY): Payer: 59

## 2020-06-30 ENCOUNTER — Encounter (HOSPITAL_COMMUNITY): Payer: Self-pay | Admitting: Emergency Medicine

## 2020-06-30 ENCOUNTER — Emergency Department (HOSPITAL_COMMUNITY)
Admission: EM | Admit: 2020-06-30 | Discharge: 2020-06-30 | Disposition: A | Discharge status: Home / Self Care | Payer: 59 | Attending: Emergency Medicine | Admitting: Emergency Medicine

## 2020-06-30 DIAGNOSIS — M25512 Pain in left shoulder: Secondary | ICD-10-CM | POA: Insufficient documentation

## 2020-06-30 DIAGNOSIS — R911 Solitary pulmonary nodule: Secondary | ICD-10-CM | POA: Diagnosis not present

## 2020-06-30 DIAGNOSIS — I10 Essential (primary) hypertension: Secondary | ICD-10-CM | POA: Insufficient documentation

## 2020-06-30 DIAGNOSIS — Z79899 Other long term (current) drug therapy: Secondary | ICD-10-CM | POA: Insufficient documentation

## 2020-06-30 DIAGNOSIS — F17210 Nicotine dependence, cigarettes, uncomplicated: Secondary | ICD-10-CM | POA: Diagnosis not present

## 2020-06-30 DIAGNOSIS — M546 Pain in thoracic spine: Secondary | ICD-10-CM | POA: Diagnosis present

## 2020-06-30 DIAGNOSIS — M25511 Pain in right shoulder: Secondary | ICD-10-CM | POA: Diagnosis not present

## 2020-06-30 DIAGNOSIS — Z7982 Long term (current) use of aspirin: Secondary | ICD-10-CM | POA: Diagnosis not present

## 2020-06-30 LAB — URINALYSIS, ROUTINE W REFLEX MICROSCOPIC
Bilirubin Urine: NEGATIVE
Glucose, UA: NEGATIVE mg/dL
Hgb urine dipstick: NEGATIVE
Ketones, ur: NEGATIVE mg/dL
Leukocytes,Ua: NEGATIVE
Nitrite: NEGATIVE
Protein, ur: NEGATIVE mg/dL
Specific Gravity, Urine: 1.014 (ref 1.005–1.030)
pH: 6 (ref 5.0–8.0)

## 2020-06-30 LAB — CBC WITH DIFFERENTIAL/PLATELET
Abs Immature Granulocytes: 0.04 10*3/uL (ref 0.00–0.07)
Basophils Absolute: 0 10*3/uL (ref 0.0–0.1)
Basophils Relative: 1 %
Eosinophils Absolute: 0 10*3/uL (ref 0.0–0.5)
Eosinophils Relative: 0 %
HCT: 47.5 % (ref 39.0–52.0)
Hemoglobin: 14.8 g/dL (ref 13.0–17.0)
Immature Granulocytes: 1 %
Lymphocytes Relative: 28 %
Lymphs Abs: 2 10*3/uL (ref 0.7–4.0)
MCH: 28.4 pg (ref 26.0–34.0)
MCHC: 31.2 g/dL (ref 30.0–36.0)
MCV: 91 fL (ref 80.0–100.0)
Monocytes Absolute: 0.6 10*3/uL (ref 0.1–1.0)
Monocytes Relative: 8 %
Neutro Abs: 4.3 10*3/uL (ref 1.7–7.7)
Neutrophils Relative %: 62 %
Platelets: 289 10*3/uL (ref 150–400)
RBC: 5.22 MIL/uL (ref 4.22–5.81)
RDW: 12.7 % (ref 11.5–15.5)
WBC: 7 10*3/uL (ref 4.0–10.5)
nRBC: 0 % (ref 0.0–0.2)

## 2020-06-30 LAB — BASIC METABOLIC PANEL
Anion gap: 10 (ref 5–15)
BUN: 16 mg/dL (ref 6–20)
CO2: 24 mmol/L (ref 22–32)
Calcium: 9.7 mg/dL (ref 8.9–10.3)
Chloride: 102 mmol/L (ref 98–111)
Creatinine, Ser: 0.89 mg/dL (ref 0.61–1.24)
GFR, Estimated: >60 mL/min (ref >60)
Glucose, Bld: 100 mg/dL — ABNORMAL HIGH (ref 70–99)
Potassium: 3.9 mmol/L (ref 3.5–5.1)
Sodium: 136 mmol/L (ref 135–145)

## 2020-06-30 LAB — TROPONIN I (HIGH SENSITIVITY): Troponin I (High Sensitivity): 4 ng/L (ref <18)

## 2020-06-30 MED ORDER — METHOCARBAMOL 500 MG PO TABS: 500.0000 mg | ORAL_TABLET | Freq: Two times a day (BID) | ORAL | 0 refills | Status: DC

## 2020-06-30 MED ORDER — IOHEXOL 350 MG/ML SOLN
75.0000 mL | Freq: Once | INTRAVENOUS | Status: AC | PRN
  Administered 2020-06-30: 14:00:00 75 mL via INTRAVENOUS

## 2020-06-30 MED ORDER — KETOROLAC TROMETHAMINE 30 MG/ML IJ SOLN
30.0000 mg | Freq: Once | INTRAMUSCULAR | Status: AC
  Administered 2020-06-30: 13:00:00 30 mg via INTRAVENOUS
  Filled 2020-06-30: qty 1

## 2020-06-30 MED FILL — METHOCARBAMOL 500 MG TABS: 500 | 10 days supply | Qty: 20 | Fill #0

## 2020-06-30 NOTE — ED Provider Notes (Signed)
[facility] EMERGENCY DEPARTMENT Provider Note   CSN: [id number] Arrival date & time: 06/30/20  3790     History Chief Complaint  Patient presents with  . Back Pain    Sean Leblanc is a 59 y.o. male. He has a history of hypertension and cardiomyopathy, smoker. Complaining of a week of intermittent pain in his right shoulder migrating through his mid back into his left shoulder and wrapping around into his chest. Been more steady since last evening. Did not go to work yesterday because of it. Not associated with any shortness of breath diaphoresis dizziness nausea vomiting. Denies prior history of same. No trauma. No numbness or weakness. No abdominal pain.  The history is provided by the patient.  Back Pain Pain location: mid back. Quality:  Aching and stabbing Radiates to:  R shoulder and L shoulder Pain severity:  Moderate Pain is:  Same all the time Onset quality:  Gradual Duration:  1 week Timing:  Intermittent Progression:  Worsening Chronicity:  New Context: not falling, not occupational injury and not recent injury   Relieved by:  Nothing Worsened by:  Nothing Ineffective treatments:  None tried Associated symptoms: chest pain   Associated symptoms: no abdominal pain, no bladder incontinence, no bowel incontinence, no dysuria, no fever, no leg pain, no numbness and no weakness        Past Medical History:  Diagnosis Date  . Cardiomyopathy ([facility])   . Hypertension   . TIA (transient ischemic attack)     Patient Active Problem List   Diagnosis Date Noted  . Pulsatile abdominal mass 02/27/2018  . Healthcare maintenance 04/19/2016  . History of transient ischemic attack (TIA) 03/10/2016  . Hyperlipidemia 03/10/2016  . Cardiomyopathy- suspect HTN CM- r/o ischemic 03/04/2016  . HTN (hypertension) 03/03/2016  . Tobacco abuse 03/03/2016    History reviewed. No pertinent surgical history.     Family History  Problem Relation Age of Onset   . Heart attack Mother   . Cancer Father     Social History   Tobacco Use  . Smoking status: Current Every Day Smoker    Packs/day: 0.40    Types: Cigarettes  . Smokeless tobacco: Never Used  . Tobacco comment: 3-4 per day cutting back   Substance Use Topics  . Alcohol use: Yes    Comment: 2 X weekly - 6pack - 12 pack  . Drug use: Not Currently    Comment: Rarely.    Home Medications Prior to Admission medications   Medication Sig Start Date End Date Taking? Authorizing Provider  atorvastatin (LIPITOR) 40 MG tablet Take 1 tablet (40 mg total) by mouth daily. 03/18/20   Albertha Ghee, MD  chlorthalidone (HYGROTON) 25 MG tablet Take 1 tablet (25 mg total) by mouth daily. 09/03/19   Jenell Milliner, MD  [facility] ASPIRIN 81 MG chewable tablet CHEW AND SWALLOW ONE TABLET BY MOUTH ONCE DAILY 07/13/16   Eulah Pont, MD  lisinopril (ZESTRIL) 40 MG tablet Take 1 tablet (40 mg total) by mouth daily. 04/16/20 04/11/21  Dellia Cloud, MD  metoprolol tartrate (LOPRESSOR) 25 MG tablet TAKE 1 TABLET BY MOUTH 2 TIMES DAILY. 11/20/19   Jenell Milliner, MD  varenicline (CHANTIX CONTINUING MONTH PAK) 1 MG tablet Take 1 tablet (1 mg total) by mouth 2 (two) times daily. 10/01/19   Jenell Milliner, MD  varenicline (CHANTIX STARTING MONTH PAK) 0.5 MG X 11 & 1 MG X 42 tablet Take one 0.5 mg tablet by mouth once  daily for 3 days, then increase to one 0.5 mg tablet twice daily for 4 days, then increase to one 1 mg tablet twice daily. 09/03/19   Al Decant, MD    Allergies    Patient has no known allergies.  Review of Systems   Review of Systems  Constitutional: Negative for fever.  HENT: Negative for sore throat.   Eyes: Negative for visual disturbance.  Respiratory: Negative for shortness of breath.   Cardiovascular: Positive for chest pain.  Gastrointestinal: Negative for abdominal pain and bowel incontinence.  Genitourinary: Negative for bladder incontinence and dysuria.  Musculoskeletal: Positive for  back pain.  Skin: Negative for rash.  Neurological: Negative for weakness and numbness.    Physical Exam Updated Vital Signs BP (!) 134/107 (BP Location: Left Arm)   Pulse 89   Temp 97.9 F (36.6 C) (Oral)   Resp 16   Ht 6' (1.829 m)   Wt 72 kg   SpO2 100%   BMI 21.53 kg/m   Physical Exam Vitals and nursing note reviewed.  Constitutional:      Appearance: Normal appearance. He is well-developed and well-nourished.  HENT:     Head: Normocephalic and atraumatic.  Eyes:     Conjunctiva/sclera: Conjunctivae normal.  Cardiovascular:     Rate and Rhythm: Normal rate and regular rhythm.     Pulses: Normal pulses.     Heart sounds: Normal heart sounds. No murmur heard.   Pulmonary:     Effort: Pulmonary effort is normal. No respiratory distress.     Breath sounds: Normal breath sounds.  Abdominal:     Palpations: Abdomen is soft. There is no mass.     Tenderness: There is no abdominal tenderness. There is no guarding or rebound.  Musculoskeletal:        General: No deformity, signs of injury or edema. Normal range of motion.     Cervical back: Neck supple.  Skin:    General: Skin is warm and dry.     Capillary Refill: Capillary refill takes less than 2 seconds.  Neurological:     General: No focal deficit present.     Mental Status: He is alert.     Sensory: No sensory deficit.     Motor: No weakness.     Gait: Gait normal.  Psychiatric:        Mood and Affect: Mood and affect normal.     ED Results / Procedures / Treatments   Labs (all labs ordered are listed, but only abnormal results are displayed) Labs Reviewed  BASIC METABOLIC PANEL - Abnormal; Notable for the following components:      Result Value   Glucose, Bld 100 (*)    All other components within normal limits  CBC WITH DIFFERENTIAL/PLATELET  URINALYSIS, ROUTINE W REFLEX MICROSCOPIC  TROPONIN I (HIGH SENSITIVITY)    EKG EKG Interpretation  Date/Time:  Tuesday June 30 2020 09:30:16  EST Ventricular Rate:  60 PR Interval:  168 QRS Duration: 100 QT Interval:  402 QTC Calculation: 402 R Axis:   47 Text Interpretation: Normal sinus rhythm Moderate voltage criteria for LVH, may be normal variant ( Sokolow-Lyon , Cornell product ) ST elevation, consider early repolarization Borderline ECG Confirmed by Aletta Edouard [phone]) on 06/30/2020 9:43:01 AM   Radiology DG Chest 2 View  Result Date: 06/30/2020 CLINICAL DATA:  Chest pain. EXAM: CHEST - 2 VIEW COMPARISON:  09/04/2010. FINDINGS: Mediastinum and hilar structures normal. Punctate calcific density right upper lung consistent with calcified  granuloma. Lungs are clear of acute infiltrates. No pleural effusion or pneumothorax. Heart size normal. Degenerative change thoracic spine. IMPRESSION: No acute cardiopulmonary disease. Electronically Signed   By: Marcello Moores  Register   On: 06/30/2020 09:34   CT Angio Chest/Abd/Pel for Dissection W and/or Wo Contrast  Result Date: 06/30/2020 CLINICAL DATA:  Chest and abdominal pain EXAM: CT ANGIOGRAPHY CHEST, ABDOMEN AND PELVIS TECHNIQUE: Non-contrast CT of the chest was initially obtained. Multidetector CT imaging through the chest, abdomen and pelvis was performed using the standard protocol during bolus administration of intravenous contrast. Multiplanar reconstructed images and MIPs were obtained and reviewed to evaluate the vascular anatomy. CONTRAST:  4mL OMNIPAQUE IOHEXOL 350 MG/ML SOLN COMPARISON:  None. FINDINGS: CTA CHEST FINDINGS Cardiovascular: There is no intramural hematoma evident on noncontrast enhanced study. There is no demonstrable thoracic aortic aneurysm or dissection. There is no evident mediastinal hematoma. Visualized great vessels appear unremarkable without aneurysm or dissection. There is no demonstrable pulmonary embolus. There is no pericardial effusion or pericardial thickening. There are foci of aortic atherosclerosis and foci of coronary artery calcification.  Mediastinum/Nodes: Thyroid appears normal. There is no evident thoracic adenopathy. No esophageal lesions are appreciable. Lungs/Pleura: There is no appreciable edema or consolidation. No pleural effusions are evident. There is a tiny calcified granuloma in the anterior segment of the right upper lobe. There is a 3 mm nodular opacity in the apical segment of the left upper lobe seen on axial slice 18 series 8. There is a 3 mm nodular opacity more laterally in the apical segment of the left upper lobe seen on axial slice 17 series 8. Musculoskeletal: There is no pancreatic mass or inflammatory focus. Review of the MIP images confirms the above findings. CTA ABDOMEN AND PELVIS FINDINGS VASCULAR Aorta: Minimal atherosclerotic calcification in the distal aorta without appreciable obstruction. No aneurysm or dissection involving the aorta. Celiac: Celiac artery and its branches are widely patent. No evident aneurysm or dissection involving these vessels. SMA: Superior mesenteric artery and its branches are widely patent. No evident aneurysm or dissection involving these vessels. Renals: There is a single renal artery on each side. Renal arteries and their branches are widely patent. No fibromuscular dysplasia. No aneurysm or dissection involving these vessels. IMA: Inferior vena cava and its branches are widely patent. No aneurysm or dissection involving these vessels. Inflow: There are calcifications in the left and right internal iliac arteries without hemodynamically significant obstruction. There is minimal calcification in the right common iliac artery without hemodynamically significant obstruction. Other pelvic arterial vessels are widely patent. No aneurysm or dissection involving these vessels. Profundus femoral and superficial femoral arteries which are visualized are widely patent. Veins: No obvious venous abnormality within the limitations of this arterial phase study. Review of the MIP images confirms the  above findings. NON-VASCULAR Hepatobiliary: No evident focal liver lesions. Gallbladder wall is not appreciably thickened. There is no biliary duct dilatation. Pancreas: No appreciable pancreatic mass or inflammatory focus. Spleen: No appreciable splenic lesions. Adrenals/Urinary Tract: Adrenals bilaterally appear unremarkable. There is a cyst in the lower pole of the left kidney measuring 1 x 1 cm. No evident hydronephrosis on either side. No renal or ureteral calculus on either side. Urinary bladder midline with wall thickness within normal limits. Stomach/Bowel: There is moderate stool in the colon. There is no appreciable bowel wall or mesenteric thickening. No evident bowel obstruction. Terminal ileum appears normal. No demonstrable free air or portal venous air. Lymphatic: No evident adenopathy in the abdomen or pelvis. Reproductive:  Prostate and seminal vesicles are normal in size and contour. Other: No periappendiceal region inflammatory change. No abscess or ascites evident in the abdomen or pelvis. Musculoskeletal: Foci of degenerative change noted in the lower lumbar spine. There are no blastic or lytic bone lesions. There is no intramuscular or abdominal wall lesion. Review of the MIP images confirms the above findings. IMPRESSION: CT angiogram chest: 1. No thoracic aortic aneurysm or dissection. No mediastinal hematoma. Great vessels appear normal. There are foci of aortic atherosclerosis and foci of coronary artery calcification. 2.  No demonstrable pulmonary embolus. 3. No edema or airspace opacity. No pleural effusions. 3 mm nodular opacities toward the apex on the left. Tiny granuloma right upper lobe, calcified. No follow-up needed if patient is low-risk (and has no known or suspected primary neoplasm). Non-contrast chest CT can be considered in 12 months if patient is high-risk. This recommendation follows the consensus statement: Guidelines for Management of Incidental Pulmonary Nodules Detected  on CT Images: From the Fleischner Society 2017; Radiology 2017; 284:228-243. 4.  No evident thoracic adenopathy. CT angiogram abdomen; CT angiogram pelvis: 1. Areas of slight atherosclerotic plaque without hemodynamically significant obstruction. No aneurysm or dissection in the aorta, major mesenteric, and major pelvic arterial vessels. No fibromuscular dysplasia. 2. No bowel wall thickening or bowel obstruction. No abscess in the abdomen or pelvis. 3. No renal or ureteral calculus. No hydronephrosis. Urinary bladder wall thickness normal. Aortic Atherosclerosis (ICD10-I70.0). Electronically Signed   By: Lowella Grip III M.D.   On: 06/30/2020 14:12    Procedures Procedures (including critical care time)  Medications Ordered in ED Medications  ketorolac (TORADOL) 30 MG/ML injection 30 mg (30 mg Intravenous Given 06/30/20 1236)  iohexol (OMNIPAQUE) 350 MG/ML injection 75 mL (75 mLs Intravenous Contrast Given 06/30/20 1356)    ED Course  I have reviewed the triage vital signs and the nursing notes.  Pertinent labs & imaging results that were available during my care of the patient were reviewed by me and considered in my medical decision making (see chart for details).  Clinical Course as of 06/30/20 1903  Tue Jun 30, 2020  0937 Chest x-ray not showing any gross pneumonia or pneumothorax. [MB]    Clinical Course User Index [MB] Hayden Rasmussen, MD   MDM Rules/Calculators/A&P                         This patient complains of mid back pain radiating to shoulders and chest; this involves an extensive number of treatment Options and is a complaint that carries with it a high risk of complications and Morbidity. The differential includes musculoskeletal pain, dissection, ACS, PE, pneumonia  I ordered, reviewed and interpreted labs, which included CBC with normal white count normal, chemistries normal, urinalysis negative, troponin normal I ordered medication IV Toradol with some  improvement in his I ordered imaging studies which included chest x-ray and CT angio aorta and I independently    visualized and interpreted imaging which showed no acute findings.  Does have incidental nodule.  Reviewed with patient. Previous records obtained and reviewed in epic, no recent admissions  After the interventions stated above, I reevaluated the patient and found patient still to be having pain although otherwise hemodynamically stable and in no distress.  Reviewed work-up with him and he is comfortable plan for trial of NSAIDs and muscle relaxant and PCP follow-up.  Return instructions discussed.   Final Clinical Impression(s) / ED Diagnoses Final diagnoses:  Acute bilateral thoracic back pain  Pulmonary nodule    Rx / DC Orders ED Discharge Orders         Ordered    methocarbamol (ROBAXIN) 500 MG tablet  2 times daily        06/30/20 1421           Hayden Rasmussen, MD 06/30/20 1905

## 2020-06-30 NOTE — Discharge Instructions (Addendum)
You were seen in the emergency department for mid back pain radiating into your shoulders.  You had blood work EKG chest x-ray and a CAT scan of your chest that did not show any obvious explanation for your symptoms.  This is likely muscular.  You can take ibuprofen 3 times a day with some food on your stomach.  We are also prescribing you a muscle relaxant.  Your CAT scan did show a pulmonary nodule and this will need to be followed up with your primary care doctor.  Return to the emergency department if any worsening or concerning symptoms.

## 2020-06-30 NOTE — ED Triage Notes (Signed)
Patient reports mid back pain onset yesterday , denies injury /respirations unlabored , no cough or fever .

## 2020-07-01 ENCOUNTER — Other Ambulatory Visit: Payer: Self-pay | Admitting: Student

## 2020-07-01 ENCOUNTER — Other Ambulatory Visit: Payer: Self-pay | Admitting: *Deleted

## 2020-07-01 DIAGNOSIS — I5042 Chronic combined systolic (congestive) and diastolic (congestive) heart failure: Secondary | ICD-10-CM

## 2020-07-01 LAB — FECAL OCCULT BLOOD, IMMUNOCHEMICAL: Fecal Occult Bld: NEGATIVE

## 2020-07-01 MED ORDER — METOPROLOL TARTRATE 25 MG PO TABS: 25.0000 mg | ORAL_TABLET | Freq: Two times a day (BID) | ORAL | 1 refills | Status: DC

## 2020-07-01 MED FILL — METOPROLOL TARTRATE 25 MG T: 25 | 30 days supply | Qty: 60 | Fill #0

## 2020-07-01 MED FILL — CHLORTHALIDONE 25 MG TABS: 25 | 30 days supply | Qty: 30 | Fill #2

## 2020-07-01 MED FILL — ATORVASTATIN 40 MG TABLET: 40 | 30 days supply | Qty: 30 | Fill #3

## 2020-07-08 ENCOUNTER — Other Ambulatory Visit: Payer: Self-pay

## 2020-07-08 ENCOUNTER — Encounter: Payer: Self-pay | Admitting: Internal Medicine

## 2020-07-08 ENCOUNTER — Ambulatory Visit: Payer: 59 | Admitting: Internal Medicine

## 2020-07-08 VITALS — BP 123/73 | HR 63 | Temp 98.2°F | Ht 72.0 in | Wt 146.4 lb

## 2020-07-08 DIAGNOSIS — R911 Solitary pulmonary nodule: Secondary | ICD-10-CM

## 2020-07-08 DIAGNOSIS — L720 Epidermal cyst: Secondary | ICD-10-CM

## 2020-07-08 DIAGNOSIS — Z720 Tobacco use: Secondary | ICD-10-CM

## 2020-07-08 DIAGNOSIS — M5414 Radiculopathy, thoracic region: Secondary | ICD-10-CM

## 2020-07-08 DIAGNOSIS — M546 Pain in thoracic spine: Secondary | ICD-10-CM

## 2020-07-08 HISTORY — DX: Epidermal cyst: L72.0

## 2020-07-08 NOTE — Assessment & Plan Note (Signed)
Recently seen in the ER for left sided back pain. This was slow in onset about one month ago. The pain is just below the scapula and radiates around to the left nipple. Sometimes associated with numbness. The pain was bad enough he went to the ER the other night but has improved with NSAIDs, flexeril, and rest. No chest pain, SOB, cough, pleurisy  XR was done in ER which showed no acute abnormality. CT chest did not show dissection, PE, or other findings except for a 9mm pulmonary nodule. No muscular or spinal tenderness or swelling on exam.   - obtain MRI with radiculopathic symptoms.

## 2020-07-08 NOTE — Assessment & Plan Note (Signed)
Recently decreased to 3-4 cigarettes per week. States he has not been taking chantix and would like to continue working on smoking cessation on his own.   - provided smoking cessation counseling and discussed importance of cessation in light of recent incidental pulmonary nodule.

## 2020-07-08 NOTE — Assessment & Plan Note (Signed)
Incidental left apex 69mm pulmonary nodule seen on recent CT.   - will need repeat CT in one year  - counseled on smoking cessation

## 2020-07-08 NOTE — Patient Instructions (Addendum)
Thank you for allowing [place] to provide your care today. Today we discussed your back pain and the cyst on your back    I have placed an order for an MRI of your back. They will call you to make an appointment.   I have also placed a referral to dermatology to have your cyst evaluated.   Please call the internal medicine center clinic if you have any questions or concerns, we may be able to help and keep you from a long and expensive emergency room wait. Our clinic and after hours phone number is [phone], the best time to call is Monday through Friday 9 am to 4 pm but there is always someone available 24/7 if you have an emergency. If you need medication refills please notify your pharmacy one week in advance and they will send [place] a request.

## 2020-07-08 NOTE — Progress Notes (Signed)
   CC: back pain  HPI:  [name] is a 59 y.o. with PMH as below.   Please see A&P for assessment of the patient's acute and chronic medical conditions.    Past Medical History:  Diagnosis Date  . Cardiomyopathy ([facility])   . Hypertension   . TIA (transient ischemic attack)    Review of Systems:   Review of Systems  Constitutional: Negative for chills, diaphoresis, fever, malaise/fatigue and weight loss.  Respiratory: Negative for cough and shortness of breath.   Cardiovascular: Negative for chest pain, palpitations and leg swelling.  Gastrointestinal: Negative for abdominal pain, blood in stool, constipation, diarrhea, melena, nausea and vomiting.  Genitourinary: Negative for dysuria, flank pain, frequency, hematuria and urgency.  Musculoskeletal: Positive for back pain and myalgias. Negative for falls and neck pain.  Endo/Heme/Allergies: Does not bruise/bleed easily.    Physical Exam:   Constitution: NAD, appears stated age Eyes: no icterus or injection  Cardio: RRR, no m/r/g, no LE edema  Respiratory: CTA, no w/r/r Abdominal: NTTP, soft, non-distended MSK: moving all extremities, full active ROM lower and upper extremities, no tenderness over left back, chest or thorax  Neuro: normal affect, a&ox3 Skin: nodule right upper back, NTTP, cystic, hard, moveable about 1.5cm, scattered small papules around back     Vitals:   07/08/20 0852  BP: 123/73  Pulse: 63  Temp: 98.2 F (36.8 C)  TempSrc: Oral  SpO2: 100%  Weight: 146 lb 6.4 oz (66.4 kg)  Height: 6' (1.829 m)     Assessment & Plan:   See Encounters Tab for problem based charting.  Patient discussed with Dr. Criselda Peaches

## 2020-07-08 NOTE — Assessment & Plan Note (Signed)
  1.5cm cyst right upper back near the thoracic spine, NTTP. Hard, moveable, no drainage. Likely inclusion cyst. Patient would like this removed.   - referral placed to dermatology

## 2020-07-14 NOTE — Progress Notes (Signed)
Internal Medicine Clinic Attending  Case discussed with Dr. Seawell  At the time of the visit.  We reviewed the resident's history and exam and pertinent patient test results.  I agree with the assessment, diagnosis, and plan of care documented in the resident's note.  

## 2020-07-22 MED FILL — LISINOPRIL 40 MG TABS: 40 | 30 days supply | Qty: 30 | Fill #1

## 2020-08-04 MED FILL — METOPROLOL TARTRATE 25 MG T: 25 | 30 days supply | Qty: 60 | Fill #1

## 2020-08-04 MED FILL — CHLORTHALIDONE 25 MG TABS: 25 | 30 days supply | Qty: 30 | Fill #3

## 2020-08-04 MED FILL — ATORVASTATIN 40 MG TABLET: 40 | 30 days supply | Qty: 30 | Fill #4

## 2020-08-20 NOTE — Addendum Note (Signed)
Addended by: Hulan Fray on: 08/20/2020 02:49 PM   Modules accepted: Orders

## 2020-08-27 MED FILL — LISINOPRIL 40 MG TABS: 40 | 30 days supply | Qty: 30 | Fill #2

## 2020-09-03 MED FILL — METOPROLOL TARTRATE 25 MG T: 25 | 30 days supply | Qty: 60 | Fill #2

## 2020-09-07 ENCOUNTER — Other Ambulatory Visit: Payer: Self-pay | Admitting: *Deleted

## 2020-09-07 ENCOUNTER — Other Ambulatory Visit: Payer: Self-pay | Admitting: Student

## 2020-09-07 DIAGNOSIS — I10 Essential (primary) hypertension: Secondary | ICD-10-CM

## 2020-09-07 MED ORDER — CHLORTHALIDONE 25 MG PO TABS: 25.0000 mg | ORAL_TABLET | Freq: Every day | ORAL | 11 refills | Status: DC

## 2020-09-07 MED FILL — CHLORTHALIDONE 25 MG TABS: 25 | 30 days supply | Qty: 30 | Fill #0

## 2020-09-14 MED FILL — ATORVASTATIN 40 MG TABLET: 40 | 30 days supply | Qty: 30 | Fill #5

## 2020-09-28 MED FILL — LISINOPRIL 40 MG TABS: 40 | 30 days supply | Qty: 30 | Fill #3

## 2020-10-21 ENCOUNTER — Other Ambulatory Visit (HOSPITAL_COMMUNITY): Payer: Self-pay

## 2020-10-21 MED FILL — Atorvastatin Calcium Tab 40 MG (Base Equivalent): ORAL | 90 days supply | Qty: 90 | Fill #0 | Status: AC

## 2020-10-21 MED FILL — Metoprolol Tartrate Tab 25 MG: ORAL | 90 days supply | Qty: 180 | Fill #0 | Status: AC

## 2020-11-12 ENCOUNTER — Other Ambulatory Visit (HOSPITAL_COMMUNITY): Payer: Self-pay

## 2020-11-12 MED FILL — Lisinopril Tab 40 MG: ORAL | 30 days supply | Qty: 30 | Fill #0 | Status: AC

## 2020-11-18 ENCOUNTER — Ambulatory Visit (HOSPITAL_COMMUNITY): Admission: RE | Admit: 2020-11-18 | Payer: Self-pay | Source: Ambulatory Visit

## 2020-12-17 ENCOUNTER — Other Ambulatory Visit (HOSPITAL_COMMUNITY): Payer: Self-pay

## 2020-12-17 MED FILL — Lisinopril Tab 40 MG: ORAL | 30 days supply | Qty: 30 | Fill #1 | Status: AC

## 2021-01-27 ENCOUNTER — Other Ambulatory Visit: Payer: Self-pay

## 2021-01-27 ENCOUNTER — Other Ambulatory Visit (HOSPITAL_COMMUNITY): Payer: Self-pay

## 2021-01-27 DIAGNOSIS — I5042 Chronic combined systolic (congestive) and diastolic (congestive) heart failure: Secondary | ICD-10-CM

## 2021-01-27 MED FILL — Lisinopril Tab 40 MG: ORAL | 30 days supply | Qty: 30 | Fill #2 | Status: AC

## 2021-01-27 NOTE — Telephone Encounter (Signed)
Pt is requesting his metoprolol tartrate (LOPRESSOR) 25 MG tablet sent to  [facility] Phone:  [phone]  Fax:  [phone]

## 2021-01-29 ENCOUNTER — Other Ambulatory Visit (HOSPITAL_COMMUNITY): Payer: Self-pay

## 2021-01-29 MED ORDER — METOPROLOL TARTRATE 25 MG PO TABS
25.0000 mg | ORAL_TABLET | Freq: Two times a day (BID) | ORAL | 1 refills | Status: DC
  Filled 2021-01-29: qty 180, 90d supply, fill #0

## 2021-01-29 NOTE — Telephone Encounter (Signed)
Call from [place] who stated pt needs a refill on Metoprolol and he's currently at the pharmacy. Thanks

## 2021-02-02 ENCOUNTER — Encounter: Payer: Self-pay | Admitting: Student

## 2021-02-02 ENCOUNTER — Other Ambulatory Visit (HOSPITAL_COMMUNITY): Payer: Self-pay

## 2021-02-23 ENCOUNTER — Other Ambulatory Visit: Payer: Self-pay | Admitting: Internal Medicine

## 2021-02-23 ENCOUNTER — Other Ambulatory Visit (HOSPITAL_COMMUNITY): Payer: Self-pay

## 2021-02-24 ENCOUNTER — Other Ambulatory Visit (HOSPITAL_COMMUNITY): Payer: Self-pay

## 2021-02-24 ENCOUNTER — Other Ambulatory Visit: Payer: Self-pay | Admitting: Internal Medicine

## 2021-02-25 ENCOUNTER — Other Ambulatory Visit (HOSPITAL_COMMUNITY): Payer: Self-pay

## 2021-02-26 ENCOUNTER — Emergency Department (HOSPITAL_COMMUNITY): Payer: BC Managed Care – PPO

## 2021-02-26 ENCOUNTER — Other Ambulatory Visit (HOSPITAL_COMMUNITY): Payer: Self-pay

## 2021-02-26 ENCOUNTER — Encounter (HOSPITAL_COMMUNITY): Payer: Self-pay | Admitting: Emergency Medicine

## 2021-02-26 ENCOUNTER — Encounter (HOSPITAL_COMMUNITY)
Admission: EM | Disposition: A | Discharge status: Home / Self Care | Payer: Self-pay | Source: Home / Self Care | Attending: Cardiology

## 2021-02-26 ENCOUNTER — Other Ambulatory Visit: Payer: Self-pay | Admitting: Student

## 2021-02-26 ENCOUNTER — Inpatient Hospital Stay (HOSPITAL_COMMUNITY)
Admission: EM | Admit: 2021-02-26 | Discharge: 2021-02-28 | DRG: 280 | Disposition: A | Discharge status: Home / Self Care | Payer: BC Managed Care – PPO | Attending: Cardiology | Admitting: Cardiology

## 2021-02-26 DIAGNOSIS — I208 Other forms of angina pectoris: Secondary | ICD-10-CM | POA: Insufficient documentation

## 2021-02-26 DIAGNOSIS — I110 Hypertensive heart disease with heart failure: Secondary | ICD-10-CM | POA: Diagnosis not present

## 2021-02-26 DIAGNOSIS — I201 Angina pectoris with documented spasm: Secondary | ICD-10-CM

## 2021-02-26 DIAGNOSIS — I5022 Chronic systolic (congestive) heart failure: Secondary | ICD-10-CM

## 2021-02-26 DIAGNOSIS — R0603 Acute respiratory distress: Secondary | ICD-10-CM | POA: Diagnosis not present

## 2021-02-26 DIAGNOSIS — I447 Left bundle-branch block, unspecified: Secondary | ICD-10-CM | POA: Diagnosis not present

## 2021-02-26 DIAGNOSIS — R9431 Abnormal electrocardiogram [ECG] [EKG]: Secondary | ICD-10-CM | POA: Diagnosis not present

## 2021-02-26 DIAGNOSIS — I428 Other cardiomyopathies: Secondary | ICD-10-CM | POA: Diagnosis present

## 2021-02-26 DIAGNOSIS — Z8249 Family history of ischemic heart disease and other diseases of the circulatory system: Secondary | ICD-10-CM

## 2021-02-26 DIAGNOSIS — Z8673 Personal history of transient ischemic attack (TIA), and cerebral infarction without residual deficits: Secondary | ICD-10-CM | POA: Diagnosis not present

## 2021-02-26 DIAGNOSIS — Z789 Other specified health status: Secondary | ICD-10-CM | POA: Insufficient documentation

## 2021-02-26 DIAGNOSIS — Z20822 Contact with and (suspected) exposure to covid-19: Secondary | ICD-10-CM | POA: Diagnosis present

## 2021-02-26 DIAGNOSIS — F1490 Cocaine use, unspecified, uncomplicated: Secondary | ICD-10-CM | POA: Diagnosis present

## 2021-02-26 DIAGNOSIS — I700 Atherosclerosis of aorta: Secondary | ICD-10-CM | POA: Diagnosis present

## 2021-02-26 DIAGNOSIS — I160 Hypertensive urgency: Secondary | ICD-10-CM

## 2021-02-26 DIAGNOSIS — F1410 Cocaine abuse, uncomplicated: Secondary | ICD-10-CM | POA: Insufficient documentation

## 2021-02-26 DIAGNOSIS — F17210 Nicotine dependence, cigarettes, uncomplicated: Secondary | ICD-10-CM | POA: Diagnosis not present

## 2021-02-26 DIAGNOSIS — R42 Dizziness and giddiness: Secondary | ICD-10-CM

## 2021-02-26 DIAGNOSIS — R0602 Shortness of breath: Secondary | ICD-10-CM | POA: Diagnosis not present

## 2021-02-26 DIAGNOSIS — I25118 Atherosclerotic heart disease of native coronary artery with other forms of angina pectoris: Secondary | ICD-10-CM | POA: Diagnosis not present

## 2021-02-26 DIAGNOSIS — F1290 Cannabis use, unspecified, uncomplicated: Secondary | ICD-10-CM | POA: Diagnosis not present

## 2021-02-26 DIAGNOSIS — I214 Non-ST elevation (NSTEMI) myocardial infarction: Secondary | ICD-10-CM | POA: Diagnosis not present

## 2021-02-26 DIAGNOSIS — R0609 Other forms of dyspnea: Secondary | ICD-10-CM | POA: Diagnosis not present

## 2021-02-26 DIAGNOSIS — R059 Cough, unspecified: Secondary | ICD-10-CM | POA: Diagnosis not present

## 2021-02-26 DIAGNOSIS — I517 Cardiomegaly: Secondary | ICD-10-CM | POA: Diagnosis not present

## 2021-02-26 DIAGNOSIS — E785 Hyperlipidemia, unspecified: Secondary | ICD-10-CM | POA: Diagnosis present

## 2021-02-26 DIAGNOSIS — I10 Essential (primary) hypertension: Secondary | ICD-10-CM

## 2021-02-26 DIAGNOSIS — F1090 Alcohol use, unspecified, uncomplicated: Secondary | ICD-10-CM | POA: Insufficient documentation

## 2021-02-26 DIAGNOSIS — R0600 Dyspnea, unspecified: Secondary | ICD-10-CM | POA: Insufficient documentation

## 2021-02-26 DIAGNOSIS — Z87898 Personal history of other specified conditions: Secondary | ICD-10-CM | POA: Insufficient documentation

## 2021-02-26 DIAGNOSIS — I2510 Atherosclerotic heart disease of native coronary artery without angina pectoris: Secondary | ICD-10-CM

## 2021-02-26 DIAGNOSIS — F1491 Cocaine use, unspecified, in remission: Secondary | ICD-10-CM | POA: Insufficient documentation

## 2021-02-26 DIAGNOSIS — I5023 Acute on chronic systolic (congestive) heart failure: Secondary | ICD-10-CM | POA: Diagnosis present

## 2021-02-26 DIAGNOSIS — J90 Pleural effusion, not elsewhere classified: Secondary | ICD-10-CM | POA: Diagnosis not present

## 2021-02-26 DIAGNOSIS — I5021 Acute systolic (congestive) heart failure: Secondary | ICD-10-CM

## 2021-02-26 DIAGNOSIS — J9811 Atelectasis: Secondary | ICD-10-CM | POA: Diagnosis not present

## 2021-02-26 HISTORY — PX: LEFT HEART CATH AND CORONARY ANGIOGRAPHY: CATH118249

## 2021-02-26 HISTORY — DX: Angina pectoris with documented spasm: I20.1

## 2021-02-26 LAB — CBC WITH DIFFERENTIAL/PLATELET
Abs Immature Granulocytes: 0.01 10*3/uL (ref 0.00–0.07)
Basophils Absolute: 0 10*3/uL (ref 0.0–0.1)
Basophils Relative: 1 %
Eosinophils Absolute: 0 10*3/uL (ref 0.0–0.5)
Eosinophils Relative: 0 %
HCT: 41.6 % (ref 39.0–52.0)
Hemoglobin: 13.7 g/dL (ref 13.0–17.0)
Immature Granulocytes: 0 %
Lymphocytes Relative: 18 %
Lymphs Abs: 1.2 10*3/uL (ref 0.7–4.0)
MCH: 29.4 pg (ref 26.0–34.0)
MCHC: 32.9 g/dL (ref 30.0–36.0)
MCV: 89.3 fL (ref 80.0–100.0)
Monocytes Absolute: 0.5 10*3/uL (ref 0.1–1.0)
Monocytes Relative: 7 %
Neutro Abs: 4.8 10*3/uL (ref 1.7–7.7)
Neutrophils Relative %: 74 %
Platelets: 304 10*3/uL (ref 150–400)
RBC: 4.66 MIL/uL (ref 4.22–5.81)
RDW: 13.1 % (ref 11.5–15.5)
WBC: 6.5 10*3/uL (ref 4.0–10.5)
nRBC: 0 % (ref 0.0–0.2)

## 2021-02-26 LAB — BASIC METABOLIC PANEL
Anion gap: 10 (ref 5–15)
BUN: 11 mg/dL (ref 6–20)
CO2: 22 mmol/L (ref 22–32)
Calcium: 9 mg/dL (ref 8.9–10.3)
Chloride: 102 mmol/L (ref 98–111)
Creatinine, Ser: 0.96 mg/dL (ref 0.61–1.24)
GFR, Estimated: >60 mL/min (ref >60)
Glucose, Bld: 117 mg/dL — ABNORMAL HIGH (ref 70–99)
Potassium: 3.7 mmol/L (ref 3.5–5.1)
Sodium: 134 mmol/L — ABNORMAL LOW (ref 135–145)

## 2021-02-26 LAB — HEMOGLOBIN A1C
Hgb A1c MFr Bld: 5 % (ref 4.8–5.6)
Mean Plasma Glucose: 96.8 mg/dL

## 2021-02-26 LAB — ECHOCARDIOGRAM COMPLETE
Calc EF: 32.3 %
Height: 71 in
S' Lateral: 4.7 cm
Single Plane A2C EF: 29.7 %
Single Plane A4C EF: 30.6 %
Weight: 2320 oz

## 2021-02-26 LAB — RAPID URINE DRUG SCREEN, HOSP PERFORMED
Amphetamines: NOT DETECTED
Barbiturates: NOT DETECTED
Benzodiazepines: NOT DETECTED
Cocaine: POSITIVE — AB
Opiates: NOT DETECTED
Tetrahydrocannabinol: POSITIVE — AB

## 2021-02-26 LAB — RESP PANEL BY RT-PCR (FLU A&B, COVID) ARPGX2
Influenza A by PCR: NEGATIVE
Influenza B by PCR: NEGATIVE
SARS Coronavirus 2 by RT PCR: NEGATIVE

## 2021-02-26 LAB — BRAIN NATRIURETIC PEPTIDE: B Natriuretic Peptide: 1596.6 pg/mL — ABNORMAL HIGH (ref 0.0–100.0)

## 2021-02-26 LAB — TROPONIN I (HIGH SENSITIVITY)
Troponin I (High Sensitivity): 3038 ng/L (ref <18)
Troponin I (High Sensitivity): 6083 ng/L (ref <18)

## 2021-02-26 LAB — TSH: TSH: 1.394 u[IU]/mL (ref 0.350–4.500)

## 2021-02-26 LAB — D-DIMER, QUANTITATIVE: D-Dimer, Quant: 0.57 ug/mL-FEU — ABNORMAL HIGH (ref 0.00–0.50)

## 2021-02-26 SURGERY — LEFT HEART CATH AND CORONARY ANGIOGRAPHY
Anesthesia: LOCAL

## 2021-02-26 MED ORDER — SODIUM CHLORIDE 0.9% FLUSH
3.0000 mL | Freq: Two times a day (BID) | INTRAVENOUS | Status: DC
  Administered 2021-02-26 – 2021-02-28 (×5): 3 mL via INTRAVENOUS

## 2021-02-26 MED ORDER — HEPARIN (PORCINE) IN NACL 1000-0.9 UT/500ML-% IV SOLN
INTRAVENOUS | Status: AC
  Filled 2021-02-26: qty 1000

## 2021-02-26 MED ORDER — NITROGLYCERIN 0.4 MG SL SUBL
0.4000 mg | SUBLINGUAL_TABLET | Freq: Once | SUBLINGUAL | Status: AC
  Administered 2021-02-26: 0.4 mg via SUBLINGUAL
  Filled 2021-02-26: qty 1

## 2021-02-26 MED ORDER — HEPARIN (PORCINE) IN NACL 1000-0.9 UT/500ML-% IV SOLN
INTRAVENOUS | Status: DC | PRN
  Administered 2021-02-26 (×2): 500 mL

## 2021-02-26 MED ORDER — HEPARIN SODIUM (PORCINE) 1000 UNIT/ML IJ SOLN
INTRAMUSCULAR | Status: DC | PRN
  Administered 2021-02-26: 3000 [IU] via INTRAVENOUS

## 2021-02-26 MED ORDER — HYDRALAZINE HCL 20 MG/ML IJ SOLN
10.0000 mg | INTRAMUSCULAR | Status: AC | PRN
  Administered 2021-02-26: 10 mg via INTRAVENOUS

## 2021-02-26 MED ORDER — DAPAGLIFLOZIN PROPANEDIOL 10 MG PO TABS
10.0000 mg | ORAL_TABLET | Freq: Every day | ORAL | Status: DC
  Administered 2021-02-27 – 2021-02-28 (×2): 10 mg via ORAL
  Filled 2021-02-26 (×2): qty 1

## 2021-02-26 MED ORDER — VERAPAMIL HCL 2.5 MG/ML IV SOLN
INTRAVENOUS | Status: DC | PRN
  Administered 2021-02-26: 10 mL via INTRA_ARTERIAL

## 2021-02-26 MED ORDER — ASPIRIN 81 MG PO CHEW
324.0000 mg | CHEWABLE_TABLET | Freq: Once | ORAL | Status: AC
  Administered 2021-02-26: 324 mg via ORAL
  Filled 2021-02-26: qty 4

## 2021-02-26 MED ORDER — ACETAMINOPHEN 325 MG PO TABS: 650.0000 mg | ORAL_TABLET | ORAL | Status: DC | PRN

## 2021-02-26 MED ORDER — HYDRALAZINE HCL 20 MG/ML IJ SOLN
INTRAMUSCULAR | Status: AC
  Filled 2021-02-26: qty 1

## 2021-02-26 MED ORDER — ONDANSETRON HCL 4 MG/2ML IJ SOLN: 4.0000 mg | Freq: Four times a day (QID) | INTRAMUSCULAR | Status: DC | PRN

## 2021-02-26 MED ORDER — SODIUM CHLORIDE 0.9 % IV SOLN: 250.0000 mL | INTRAVENOUS | Status: DC | PRN

## 2021-02-26 MED ORDER — HYDRALAZINE HCL 25 MG PO TABS
25.0000 mg | ORAL_TABLET | Freq: Three times a day (TID) | ORAL | Status: DC
  Administered 2021-02-26 – 2021-02-27 (×2): 25 mg via ORAL
  Filled 2021-02-26 (×2): qty 1

## 2021-02-26 MED ORDER — IOHEXOL 350 MG/ML SOLN
50.0000 mL | Freq: Once | INTRAVENOUS | Status: AC | PRN
  Administered 2021-02-26: 50 mL via INTRAVENOUS

## 2021-02-26 MED ORDER — NITROGLYCERIN 0.4 MG SL SUBL
0.4000 mg | SUBLINGUAL_TABLET | Freq: Once | SUBLINGUAL | Status: AC
  Administered 2021-02-26: 0.4 mg via SUBLINGUAL

## 2021-02-26 MED ORDER — BUMETANIDE 0.25 MG/ML IJ SOLN
1.0000 mg | Freq: Every day | INTRAMUSCULAR | Status: DC
  Administered 2021-02-26 – 2021-02-27 (×2): 1 mg via INTRAVENOUS
  Filled 2021-02-26 (×3): qty 4

## 2021-02-26 MED ORDER — LABETALOL HCL 5 MG/ML IV SOLN: 10.0000 mg | INTRAVENOUS | Status: AC | PRN

## 2021-02-26 MED ORDER — HEPARIN BOLUS VIA INFUSION
4000.0000 [IU] | Freq: Once | INTRAVENOUS | Status: AC
  Administered 2021-02-26: 4000 [IU] via INTRAVENOUS
  Filled 2021-02-26: qty 4000

## 2021-02-26 MED ORDER — IOHEXOL 350 MG/ML SOLN
INTRAVENOUS | Status: DC | PRN
  Administered 2021-02-26: 50 mL

## 2021-02-26 MED ORDER — NITROGLYCERIN IN D5W 200-5 MCG/ML-% IV SOLN: 0.0000 ug/min | INTRAVENOUS | Status: DC

## 2021-02-26 MED ORDER — VERAPAMIL HCL 2.5 MG/ML IV SOLN
INTRAVENOUS | Status: AC
  Filled 2021-02-26: qty 2

## 2021-02-26 MED ORDER — MELATONIN 3 MG PO TABS
3.0000 mg | ORAL_TABLET | Freq: Every day | ORAL | Status: DC
  Administered 2021-02-26 – 2021-02-27 (×2): 3 mg via ORAL
  Filled 2021-02-26 (×2): qty 1

## 2021-02-26 MED ORDER — HEPARIN SODIUM (PORCINE) 5000 UNIT/ML IJ SOLN
5000.0000 [IU] | Freq: Three times a day (TID) | INTRAMUSCULAR | Status: DC
  Administered 2021-02-26 – 2021-02-28 (×6): 5000 [IU] via SUBCUTANEOUS
  Filled 2021-02-26 (×6): qty 1

## 2021-02-26 MED ORDER — NITROGLYCERIN IN D5W 200-5 MCG/ML-% IV SOLN
INTRAVENOUS | Status: AC
  Administered 2021-02-26: 5 ug/min via INTRAVENOUS
  Filled 2021-02-26: qty 250

## 2021-02-26 MED ORDER — FUROSEMIDE 10 MG/ML IJ SOLN
40.0000 mg | Freq: Once | INTRAMUSCULAR | Status: AC
  Administered 2021-02-26: 40 mg via INTRAVENOUS
  Filled 2021-02-26: qty 4

## 2021-02-26 MED ORDER — LIDOCAINE HCL (PF) 1 % IJ SOLN
INTRAMUSCULAR | Status: DC | PRN
  Administered 2021-02-26: 2 mL

## 2021-02-26 MED ORDER — LABETALOL HCL 5 MG/ML IV SOLN: 10.0000 mg | Freq: Once | INTRAVENOUS | Status: DC

## 2021-02-26 MED ORDER — SODIUM CHLORIDE 0.9% FLUSH: 3.0000 mL | INTRAVENOUS | Status: DC | PRN

## 2021-02-26 MED ORDER — HEPARIN SODIUM (PORCINE) 1000 UNIT/ML IJ SOLN
INTRAMUSCULAR | Status: AC
  Filled 2021-02-26: qty 1

## 2021-02-26 MED ORDER — LABETALOL HCL 5 MG/ML IV SOLN
10.0000 mg | Freq: Once | INTRAVENOUS | Status: AC
  Administered 2021-02-26: 10 mg via INTRAVENOUS
  Filled 2021-02-26: qty 4

## 2021-02-26 MED ORDER — LIDOCAINE HCL (PF) 1 % IJ SOLN
INTRAMUSCULAR | Status: AC
  Filled 2021-02-26: qty 30

## 2021-02-26 MED ORDER — CARVEDILOL 6.25 MG PO TABS
6.2500 mg | ORAL_TABLET | Freq: Two times a day (BID) | ORAL | Status: DC
  Administered 2021-02-26 – 2021-02-28 (×4): 6.25 mg via ORAL
  Filled 2021-02-26 (×4): qty 1

## 2021-02-26 MED ORDER — NITROGLYCERIN 0.4 MG SL SUBL
SUBLINGUAL_TABLET | SUBLINGUAL | Status: AC
  Filled 2021-02-26: qty 1

## 2021-02-26 MED ORDER — IRBESARTAN 150 MG PO TABS
150.0000 mg | ORAL_TABLET | Freq: Every day | ORAL | Status: DC
Start: 2021-02-26 — End: 2021-02-28
  Administered 2021-02-26 – 2021-02-27 (×2): 150 mg via ORAL
  Filled 2021-02-26 (×2): qty 1

## 2021-02-26 MED ORDER — HEPARIN (PORCINE) 25000 UT/250ML-% IV SOLN
800.0000 [IU]/h | INTRAVENOUS | Status: DC
  Administered 2021-02-26: 800 [IU]/h via INTRAVENOUS
  Filled 2021-02-26: qty 250

## 2021-02-26 SURGICAL SUPPLY — 10 items
CATH 5FR JL3.5 JR4 ANG PIG MP (CATHETERS) ×2 IMPLANT
DEVICE RAD COMP TR BAND LRG (VASCULAR PRODUCTS) ×2 IMPLANT
GLIDESHEATH SLEND A-KIT 6F 22G (SHEATH) ×2 IMPLANT
GLIDESHEATH SLEND SS 6F .021 (SHEATH) IMPLANT
GUIDEWIRE INQWIRE 1.5J.035X260 (WIRE) ×1 IMPLANT
INQWIRE 1.5J .035X260CM (WIRE) ×2
KIT HEART LEFT (KITS) ×2 IMPLANT
PACK CARDIAC CATHETERIZATION (CUSTOM PROCEDURE TRAY) ×2 IMPLANT
TRANSDUCER W/STOPCOCK (MISCELLANEOUS) ×2 IMPLANT
TUBING CIL FLEX 10 FLL-RA (TUBING) ×2 IMPLANT

## 2021-02-26 NOTE — Interval H&P Note (Signed)
History and Physical Interval Note:  02/26/2021 2:38 PM  Sean Leblanc  has presented today for surgery, with the diagnosis of unstable angina.  The various methods of treatment have been discussed with the patient and family. After consideration of risks, benefits and other options for treatment, the patient has consented to  Procedure(s): LEFT HEART CATH AND CORONARY ANGIOGRAPHY (N/A) as a surgical intervention.  The patient's history has been reviewed, patient examined, no change in status, stable for surgery.  I have reviewed the patient's chart and labs.  Questions were answered to the patient's satisfaction.     Adrian Prows  Cath Lab Visit (complete for each Cath Lab visit)  Clinical Evaluation Leading to the Procedure:   ACS: Yes.    Non-ACS:    Anginal Classification: CCS IV  Anti-ischemic medical therapy: Minimal Therapy (1 class of medications)  Non-Invasive Test Results: No non-invasive testing performed  Prior CABG: No previous CABG  Adrian Prows, MD, [facility] 02/26/2021, 2:39 PM Office: [phone] Fax: [phone] Pager: [phone]

## 2021-02-26 NOTE — ED Triage Notes (Signed)
Patient complains of ongoing dizziness that started this morning. Patient reports difficulty sleeping recently and states he has only been sleeping approximately one hour per night for the last several days. Patient alert, oriented, and ambulated from lobby to treatment room with steady gait.

## 2021-02-26 NOTE — Progress Notes (Signed)
ANTICOAGULATION CONSULT NOTE - Initial Consult  Pharmacy Consult for heparin Indication: chest pain/ACS  No Known Allergies  Patient Measurements:   Heparin Dosing Weight: 65kg  Vital Signs: Temp: 98.2 F (36.8 C) (08/19 0822) Temp Source: Oral (08/19 0822) BP: 183/132 (08/19 0822) Pulse Rate: 81 (08/19 0822)  Labs: No results for input(s): HGB, HCT, PLT, APTT, LABPROT, INR, HEPARINUNFRC, HEPRLOWMOCWT, CREATININE, CKTOTAL, CKMB, TROPONINIHS in the last 72 hours.  CrCl cannot be calculated (Patient's most recent lab result is older than the maximum 21 days allowed.).   Medical History: Past Medical History:  Diagnosis Date   Cardiomyopathy ([place])    Hypertension    TIA (transient ischemic attack)     Assessment: 75 YOM presenting with dizziness, SOB, concern for ACS, he is not on anticoagulation PTA  Goal of Therapy:  Heparin level 0.3-0.7 units/ml Monitor platelets by anticoagulation protocol: Yes   Plan:  Heparin 4000 units IV x 1, and gtt at 800 units/hr F/u 6 hour heparin level F/u cards eval and recs  Bertis Ruddy, PharmD Clinical Pharmacist ED Pharmacist Phone # [phone] 02/26/2021 9:16 AM

## 2021-02-26 NOTE — Progress Notes (Signed)
  Echocardiogram 2D Echocardiogram has been performed.  Sean Leblanc 02/26/2021, 2:21 PM

## 2021-02-26 NOTE — ED Notes (Signed)
Transported to cath lab #[id number]

## 2021-02-26 NOTE — H&P (View-Only) (Signed)
CARDIOLOGY CONSULT NOTE  Patient ID: Sean Leblanc MRN: [id number] DOB/AGE: 15-May-1961 60 y.o.  Admit date: 02/26/2021 Attending physician: Lajean Saver, MD  Primary Physician:  Virl Axe, MD Outpatient Cardiologist: None  Inpatient Cardiologist: Rex Kras, DO, [facility]  Reason of consultation: Abnormal EKG, shortness of breath Referring physician: Lajean Saver, MD   Chief complaint: Dizziness  HPI:  Sean Leblanc is a 60 y.o. African-American male who presents with a chief complaint of " dizziness and shortness of breath." His past medical history and cardiovascular risk factors include: TIA (August 2017), history of cardiomyopathy, hypertension, hyperlipidemia, coronary artery atherosclerosis and aortic atherosclerosis (not gated CT 06/30/2020) cigarette smoking, alcohol use.  Patient presents to the hospital with a chief complaint of dizziness and shortness of breath.  He has been having shortness of breath that is been getting progressively worse and more noticeable with effort related activities.  The symptoms usually resolve with resting.  He denies orthopnea, paroxysmal nocturnal dyspnea or lower extremity swelling.  Patient denies chest pain to suggest angina pectoris.  Last seen by cardiology in 2018 and then lost to follow-up due to insurance coverage.  EKG performed in the ED notes dynamic EKG changes with intermittent left bundle branch block and T wave inversions in the inferior leads to suggest ischemia.  Cardiology was consulted given his shortness of breath and EKG findings.  Upon further review patient has known diagnosis of cardiomyopathy most likely secondary to uncontrolled hypertension, left ventricular hypertrophy, and grade 2 diastolic dysfunction as per the last echocardiogram.  Patient states that he has been compliant with his medical therapy.  ALLERGIES: No Known Allergies  PAST MEDICAL HISTORY: Past Medical History:  Diagnosis Date    Cardiomyopathy ([place])    Hypertension    TIA (transient ischemic attack)     PAST SURGICAL HISTORY: No past surgical history on file.  FAMILY HISTORY: The patient family history includes Cancer in his father; Heart attack in his mother.   SOCIAL HISTORY:  The patient  reports that he has been smoking cigarettes. He has been smoking an average of .4 packs per day. He has never used smokeless tobacco. He reports current alcohol use. He reports that he does not currently use drugs.  MEDICATIONS: Current Outpatient Medications  Medication Instructions   atorvastatin (LIPITOR) 40 MG tablet TAKE 1 TABLET (40 MG TOTAL) BY MOUTH DAILY.   chlorthalidone (HYGROTON) 25 MG tablet TAKE 1 TABLET (25 MG TOTAL) BY MOUTH DAILY.   EQ CHILDRENS ASPIRIN 81 MG chewable tablet CHEW AND SWALLOW ONE TABLET BY MOUTH ONCE DAILY   lisinopril (ZESTRIL) 40 MG tablet TAKE 1 TABLET (40 MG TOTAL) BY MOUTH DAILY.   methocarbamol (ROBAXIN) 500 MG tablet TAKE 1 TABLET BY MOUTH TWICE DAILY.   metoprolol tartrate (LOPRESSOR) 25 mg, Oral, 2 times daily    REVIEW OF SYSTEMS: Review of Systems  Constitutional: Positive for diaphoresis. Negative for chills and fever.  HENT:  Negative for hoarse voice and nosebleeds.   Eyes:  Negative for discharge, double vision and pain.  Cardiovascular:  Positive for dyspnea on exertion. Negative for chest pain, claudication, leg swelling, near-syncope, orthopnea, palpitations, paroxysmal nocturnal dyspnea and syncope.  Respiratory:  Positive for shortness of breath. Negative for hemoptysis.   Musculoskeletal:  Negative for muscle cramps and myalgias.  Gastrointestinal:  Positive for diarrhea. Negative for abdominal pain, constipation, hematemesis, hematochezia, melena, nausea and vomiting.  Neurological:  Positive for dizziness and light-headedness.  All other systems reviewed and are negative.  PHYSICAL  EXAM: Vitals with BMI 02/26/2021 02/26/2021 02/26/2021  Height - - -  Weight - -  -  BMI - - -  Systolic 0000000 0000000 [id number]  Diastolic [id number]  Pulse 77 - -    No intake or output data in the 24 hours ending 02/26/21 1027  Net IO Since Admission: No IO data has been entered for this period [02/26/21 1027]   CONSTITUTIONAL: Well-developed and well-nourished. No acute distress.  SKIN: Skin is warm and dry. No rash noted. No cyanosis. No pallor. No jaundice HEAD: Normocephalic and atraumatic.  EYES: No scleral icterus MOUTH/THROAT: Moist oral membranes.  NECK: JVD present. No thyromegaly noted. No carotid bruits  LYMPHATIC: No visible cervical adenopathy.  CHEST Normal respiratory effort. No intercostal retractions  LUNGS: Clear to auscultation bilaterally.  No stridor. No wheezes. No rales.  CARDIOVASCULAR: Regular, positive S1-S2, no murmurs rubs or gallops appreciated. ABDOMINAL: Soft, nontender, nondistended, positive bowel sounds in all 4 quadrants, no apparent ascites.  EXTREMITIES: No peripheral edema, warm to touch bilaterally, 2+ DP and PT pulses. HEMATOLOGIC: No significant bruising NEUROLOGIC: Oriented to person, place, and time. Nonfocal. Normal muscle tone.  PSYCHIATRIC: Normal mood and affect. Normal behavior. Cooperative  RADIOLOGY: DG Chest Portable 1 View  Result Date: 02/26/2021 CLINICAL DATA:  Shortness of breath EXAM: PORTABLE CHEST 1 VIEW COMPARISON:  CT a chest 06/30/2020, chest radiographs 06/30/2020 FINDINGS: The heart is enlarged, significantly increased since the prior study though likely partially exaggerated by AP technique. The mediastinal contours are within normal limits. There is a small left pleural effusion with adjacent patchy opacities in the left base. There is no other focal opacity. There is no right pleural effusion. There is no pneumothorax. There is no acute osseous abnormality. IMPRESSION: 1. Cardiomegaly, increased since the prior study. 2. Small left pleural effusion with adjacent airspace disease. Electronically Signed   By:  Valetta Mole M.D.   On: 02/26/2021 09:05    LABORATORY DATA: Lab Results  Component Value Date   WBC 6.5 02/26/2021   HGB 13.7 02/26/2021   HCT 41.6 02/26/2021   MCV 89.3 02/26/2021   PLT 304 02/26/2021   No results for input(s): NA, K, CL, CO2, BUN, CREATININE, CALCIUM, PROT, BILITOT, ALKPHOS, ALT, AST, GLUCOSE in the last 168 hours.  Invalid input(s): LABALBU  Lipid Panel  Lab Results  Component Value Date   CHOL 114 05/23/2017   HDL 46 05/23/2017   LDLCALC 57 05/23/2017   TRIG 55 05/23/2017   CHOLHDL 2.5 05/23/2017    BNP (last 3 results) No results for input(s): BNP in the last 8760 hours.  HEMOGLOBIN A1C Lab Results  Component Value Date   HGBA1C 5.1 03/04/2016   MPG 100 03/04/2016    Cardiac Panel (last 3 results) No results for input(s): CKTOTAL, CKMB, TROPONINIHS, RELINDX in the last 72 hours.   TSH No results for input(s): TSH in the last 8760 hours.   CARDIAC DATABASE: EKG: 02/26/2021 8:36 AM: Normal sinus rhythm, 85 bpm, left bundle branch block, with minimal ST elevations in the inferior leads.  Compared to prior ECG 06/30/2020 left bundle branch block and repolarization abnormality is new.  02/26/2021 8:55 AM: Normal sinus rhythm, 70 bpm, poor R wave progression, LVH per voltage criteria, ST-T changes in the lateral leads likely secondary to repolarization abnormality, T wave inversions in the inferior leads cannot rule out inferior ischemia rare PVCs.  Compared to prior ECG minimal ST elevations in the inferior leads are resolved.  Echocardiogram: Echo  02/2016 EF 35-40%, severe concentric hypertrophy, diffuse hypokinesis  05/2017: - Left ventricle: The cavity size was normal. Wall thickness was    increased in a pattern of mild LVH. Systolic function was mildly    to moderately reduced. The estimated ejection fraction was in the    range of 40% to 45%. Diffuse hypokinesis. Features are consistent    with a pseudonormal left ventricular filling  pattern, with    concomitant abnormal relaxation and increased filling pressure    (grade 2 diastolic dysfunction).  - Aortic valve: There was no stenosis.  - Mitral valve: Mildly calcified annulus. Normal thickness leaflets    . There was no significant regurgitation.  - Left atrium: The atrium was mildly dilated.  - Right ventricle: The cavity size was normal. Systolic function    was normal.  - Pulmonary arteries: No complete TR doppler jet so unable to    estimate PA systolic pressure.  - Inferior vena cava: The vessel was normal in size. The    respirophasic diameter changes were in the normal range (>= 50%),    consistent with normal central venous pressure.  Stress Testing:  05/2017: The left ventricular ejection fraction is moderately decreased (30-44%). Nuclear stress EF: 39%. There was no ST segment deviation noted during stress. Patient developed a LBBB with the infusion of Lexiscan. This is an intermediate risk study due to reduced systolic function. No ischemia.  Heart Catheterization: None  IMPRESSION & RECOMMENDATIONS: Sean Leblanc is a 60 y.o. African-American male whose past medical history and cardiovascular risk factors include: TIA (August 2017), history of cardiomyopathy, hypertension, hyperlipidemia, coronary artery atherosclerosis and aortic atherosclerosis (not gated CT 06/30/2020) cigarette smoking, alcohol use.  Impression: Anginal equivalent. Dyspnea on exertion Abnormal EKG Intermittent left bundle branch block Cardiomyopathy, presumed nonischemic Hypertensive urgency Hyperlipidemia Coronary artery atherosclerosis Aortic atherosclerosis Cigarette smoking Alcohol use, excessive  Plan: Patient presents to the hospital with progressive shortness of breath with effort related activities suggestive of possible angina equivalent, dynamic EKG changes, multiple cardiovascular risk factors including hypertension, hyperlipidemia, coronary artery  atherosclerosis, smoker.  He has had prior work-up in 2018 which notes mildly reduced LVEF, global hypokinesis, LVH, grade 2 diastolic impairment.  Outpatient follow-up has been difficult due to insurance coverage and has been lost to follow-up since 2018.  Due to his symptoms, EKG findings, discussed ischemic evaluation with regards to proceeding with coronary CTA versus left heart catheterization.  After discussing the risks, benefits, and limitations patient wishes to proceed with left heart catheterization with possible intervention.  The procedure of left heart catheterization with possible intervention was explained to the patient in detail. The indication, alternatives, risks and benefits were reviewed.  Complications include but not limited to bleeding, infection, vascular injury, stroke, myocardial infection, arrhythmia, kidney injury requiring short-term/long-term dialysis, radiation-related injury in the case of prolonged fluoroscopy use, emergency cardiac surgery, and death. The patient understands the risks of serious complication is 1-2 in [id number] with diagnostic cardiac cath and 1-2% or less with angioplasty/stenting.   The patient  voices understanding and provides verbal feedback and wishes to proceed with coronary angiography with possible PCI.  High sensitive troponins, pending.  Check hemoglobin A1c, UDS, and a fasting lipid profile in the morning.  Plan echocardiogram.  IV heparin per ACS protocol.  Recommend restarting home medications.  Defer blood pressure management to primary team.  We will follow  Initiate aspirin 81 mg p.o. daily starting 08/20 given the history of TIA, aortic atherosclerosis and coronary  calcification.  Educated the patient on the importance of complete smoking cessation.  And also encouraged to reduce alcohol intake.  Further recommendations to follow.  Case discussed with ED physician.  Continue your care given his other chronic comorbid  conditions.  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   CRITICAL CARE Performed by: Rex Kras   Total critical care time: 61 minutes   Critical care time was exclusive of separately billable procedures and treating other patients.   Critical care was necessary to treat or prevent imminent or life-threatening deterioration.   Critical care was time spent personally by me on the following activities: development of treatment plan with patient  as well as ED physician, evaluation of patient's response to treatment, examination of patient, obtaining history from patient or surrogate, ordering and performing treatments and interventions, ordering and review of laboratory studies, ordering and review of radiographic studies, pulse oximetry and re-evaluation of patient's condition.  Mechele Claude [facility]  Pager: [phone] Office: [phone] 02/26/2021, 10:27 AM   Addendum: Was called by ED physician stating the patient was more short of breath after returning from CT.  Blood pressure elevated.  Started on nitro drip and recommended reevaluation.  Patient's initial troponin resulted as [id number] followed by [id number] since he was last evaluated.  UDS also came back positive for cocaine and marijuana.  Of note he denied illicit drug use on presentation.  Patient will be taken to the Cath Lab sooner than originally planned to further evaluate his coronaries.  I suspect his current symptoms are secondary to his underlying cardiomyopathy, hypertensive urgency, and polysubstance abuse.  Patient ruled in for non-STEMI.  Will give Lasix 40 mEq IV push x1.  CRITICAL CARE Performed by: Rex Kras   Total critical care time: 35 minutes   Critical care time was exclusive of separately billable procedures and treating other patients.   Critical care was necessary to treat or prevent imminent or life-threatening deterioration.   Critical care was time spent personally by me  on the following activities: development of treatment plan with patient and/or surrogate as well as nursing, discussions with consultants, evaluation of patient's response to treatment, examination of patient, obtaining history from patient or surrogate, ordering and performing treatments and interventions, ordering and review of laboratory studies, ordering and review of radiographic studies, pulse oximetry and re-evaluation of patient's condition.  Rex Kras, DO, [facility]. [facility] Pager: [phone] Office: [phone]   Mechele Claude [facility]  Pager: [phone] Office: [phone] 02/26/21 11:49 AM

## 2021-02-26 NOTE — ED Provider Notes (Signed)
[facility] EMERGENCY DEPARTMENT Provider Note   CSN: [id number] Arrival date & time: 02/26/21  [id number]     History Chief Complaint  Patient presents with   Dizziness    Sean Leblanc is a 61 y.o. male.  HPI  60 year old male with a past medical history of cardiomyopathy, hypertension, TIA presenting to the emergency department with an episode of lightheadedness.  Patient states that he woke up this morning feeling slightly more tired than usual, but thought he just needed some time to wake up.  He states when he stood up to get ready for work around 4:30 AM, he was lightheaded.  He also had some shortness of breath with exertion today.  When he got to work, his symptoms were still present so he decided to present here to the emergency department.  He denies any room spinning dizziness, reports that it is a feeling of being swimmy headed and lightheaded.  He denies any recent falls.  He states that he will be more short of breath if he exerts himself or if he lies flat.  He denies any lower extremity swelling.  He denies any chest pain.  He denies any fever, cough, nausea, vomiting, or diarrhea.  He states that he takes his blood pressure medicine at 6 PM, so has not taken it today but he did take it yesterday.  Past Medical History:  Diagnosis Date   Cardiomyopathy [facility])    Hypertension    TIA (transient ischemic attack)     Patient Active Problem List   Diagnosis Date Noted   Thoracic radiculopathy 07/08/2020   Inclusion cyst 07/08/2020   Pulmonary nodule 07/08/2020   Pulsatile abdominal mass 02/27/2018   Healthcare maintenance 04/19/2016   History of transient ischemic attack (TIA) 03/10/2016   Hyperlipidemia 03/10/2016   Cardiomyopathy- suspect HTN CM- r/o ischemic 03/04/2016   HTN (hypertension) 03/03/2016   Tobacco abuse 03/03/2016    No past surgical history on file.     Family History  Problem Relation Age of Onset   Heart attack Mother     Cancer Father     Social History   Tobacco Use   Smoking status: Every Day    Packs/day: 0.40    Types: Cigarettes   Smokeless tobacco: Never   Tobacco comments:    1-2 per week   Substance Use Topics   Alcohol use: Yes    Comment: 2 X weekly - 6pack - 12 pack   Drug use: Not Currently    Comment: Rarely.    Home Medications Prior to Admission medications   Medication Sig Start Date End Date Taking? Authorizing Provider  atorvastatin (LIPITOR) 40 MG tablet TAKE 1 TABLET (40 MG TOTAL) BY MOUTH DAILY. 03/18/20 03/18/21  Madalyn Rob, MD  chlorthalidone (HYGROTON) 25 MG tablet TAKE 1 TABLET (25 MG TOTAL) BY MOUTH DAILY. 09/07/20 09/07/21  Cato Mulligan, MD  [employer] ASPIRIN 81 MG chewable tablet CHEW AND SWALLOW ONE TABLET BY MOUTH ONCE DAILY 07/13/16   Ledell Noss, MD  lisinopril (ZESTRIL) 40 MG tablet TAKE 1 TABLET (40 MG TOTAL) BY MOUTH DAILY. 04/16/20 04/16/21  Marianna Payment, MD  methocarbamol (ROBAXIN) 500 MG tablet TAKE 1 TABLET BY MOUTH TWICE DAILY. 06/30/20 06/30/21  Hayden Rasmussen, MD  metoprolol tartrate (LOPRESSOR) 25 MG tablet Take 1 tablet (25 mg total) by mouth 2 (two) times daily. 01/29/21   Jose Persia, MD    Allergies    Patient has no known allergies.  Review  of Systems   Review of Systems  Constitutional:  Positive for fatigue. Negative for chills and fever.  HENT:  Negative for ear pain and sore throat.   Eyes:  Negative for pain and visual disturbance.  Respiratory:  Negative for cough and shortness of breath.   Cardiovascular:  Negative for chest pain and palpitations.  Gastrointestinal:  Negative for abdominal pain and vomiting.  Genitourinary:  Negative for dysuria and hematuria.  Musculoskeletal:  Negative for arthralgias and back pain.  Skin:  Negative for color change and rash.  Neurological:  Positive for light-headedness. Negative for dizziness, tremors, seizures, syncope, facial asymmetry, speech difficulty, weakness, numbness and headaches.   All other systems reviewed and are negative.  Physical Exam Updated Vital Signs BP (!) 178/139 (BP Location: Right Arm)   Pulse 77   Temp 98.2 F (36.8 C) (Oral)   Resp 18   SpO2 99%   Physical Exam Vitals and nursing note reviewed.  Constitutional:      Appearance: He is well-developed.     Comments: Thin  HENT:     Head: Normocephalic and atraumatic.  Eyes:     Conjunctiva/sclera: Conjunctivae normal.  Cardiovascular:     Rate and Rhythm: Normal rate and regular rhythm.     Heart sounds: No murmur heard. Pulmonary:     Effort: Pulmonary effort is normal. No respiratory distress.     Breath sounds: Normal breath sounds. No stridor. No wheezing, rhonchi or rales.  Abdominal:     Palpations: Abdomen is soft.     Tenderness: There is no abdominal tenderness. There is no guarding or rebound.  Musculoskeletal:     Cervical back: Neck supple.  Skin:    General: Skin is warm and dry.     Capillary Refill: Capillary refill takes less than 2 seconds.  Neurological:     Mental Status: He is alert and oriented to person, place, and time.    ED Results / Procedures / Treatments   Labs (all labs ordered are listed, but only abnormal results are displayed) Labs Reviewed  D-DIMER, QUANTITATIVE - Abnormal; Notable for the following components:      Result Value   D-Dimer, Quant 0.57 (*)    All other components within normal limits  RESP PANEL BY RT-PCR (FLU A&B, COVID) ARPGX2  CBC WITH DIFFERENTIAL/PLATELET  BASIC METABOLIC PANEL  BRAIN NATRIURETIC PEPTIDE  HEPARIN LEVEL (UNFRACTIONATED)  TROPONIN I (HIGH SENSITIVITY)  TROPONIN I (HIGH SENSITIVITY)    EKG EKG Interpretation  Date/Time:  Friday February 26 2021 08:19:39 EDT Ventricular Rate:  85 PR Interval:  165 QRS Duration: 155 QT Interval:  466 QTC Calculation: 555 R Axis:   20 Text Interpretation: Sinus rhythm Left bundle branch block `ecg changed from prior - cardiology emergently consulted Confirmed by Lajean Saver [phone]) on 02/26/2021 8:36:51 AM  Radiology DG Chest Portable 1 View  Result Date: 02/26/2021 CLINICAL DATA:  Shortness of breath EXAM: PORTABLE CHEST 1 VIEW COMPARISON:  CT a chest 06/30/2020, chest radiographs 06/30/2020 FINDINGS: The heart is enlarged, significantly increased since the prior study though likely partially exaggerated by AP technique. The mediastinal contours are within normal limits. There is a small left pleural effusion with adjacent patchy opacities in the left base. There is no other focal opacity. There is no right pleural effusion. There is no pneumothorax. There is no acute osseous abnormality. IMPRESSION: 1. Cardiomegaly, increased since the prior study. 2. Small left pleural effusion with adjacent airspace disease. Electronically Signed  By: Valetta Mole M.D.   On: 02/26/2021 09:05    Procedures Procedures   Medications Ordered in ED Medications  heparin ADULT infusion 100 units/mL (25000 units/275m) (800 Units/hr Intravenous New Bag/Given 02/26/21 0948)  labetalol (NORMODYNE) injection 10 mg (has no administration in time range)  aspirin chewable tablet 324 mg (324 mg Oral Given 02/26/21 0852)  nitroGLYCERIN (NITROSTAT) SL tablet 0.4 mg (0.4 mg Sublingual Given 02/26/21 0947)  heparin bolus via infusion 4,000 Units (4,000 Units Intravenous Bolus from Bag 02/26/21 0948)    ED Course  I have reviewed the triage vital signs and the nursing notes.  Pertinent labs & imaging results that were available during my care of the patient were reviewed by me and considered in my medical decision making (see chart for details).    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  Vital signs reviewed, the patient is hypertensive on arrival.  Physical exam is notable for thin male.   He denies any active chest pain.  He states that his shortness of breath is improved while he is at rest.  Patient states he has also been waking up at night over the past week.  Differential includes ACS, heart failure exacerbation, hypovolemia, viral syndrome, pneumonia, pneumothorax, pulmonary embolism.  Initial EKG demonstrates likely sinus rhythm with LVH but also concerning for some ST elevation in lead III.  A repeat EKG was obtained several minutes after the patient's arrival with a much improved baseline.  The previously seen ST elevations had resolved, but he had marked ST depressions and T wave inversions in the inferior leads as well as the lateral precordial leads.  Discussed the case with cardiology.  They recommended initiating heparin, obtaining a D-dimer to evaluate for PE, and they will plan to cath the patient this afternoon.  We will also send delta troponins, give aspirin.  Will give nitroglycerin followed by another agent in order to control his blood pressure.  Will obtain a CBC, BMP, chest x-ray as well to look for other etiologies.  CBC unremarkable.  Metabolic panel shows normal electrolytes, normal creatinine.  Initial troponin of 3000.  BNP of 1500.  D-dimer returned at 0.57, CTA study ordered.  After returning from the CTA where the patient was lying down flat, the patient was in respiratory distress.  He desaturated to 85% on room air, he was tachycardic, and more hypertensive to 200/150.  He was given additional sublingual nitroglycerin x2 and started on nitroglycerin infusion.  I suspect that the patient had flash pulmonary edema due to his hypertension and from fluid shifts after laying flat.  Repeat troponin came back at 6000.  After oxygen and antihypertensives, the patient clinically much improved and he was no longer in respiratory distress.  I updated cardiology on the patient's clinical status, and they stated that they will move up his catheterization time.  We will  continue heparin, status post aspirin, continuing hypertensives, continue to follow cardiology recommendations.  Patient has been given IV Lasix.  Patient taken to Cath Lab without requiring further emergent intervention. Final Clinical Impression(s) / ED Diagnoses Final diagnoses:  Lightheadedness  Hypertension, unspecified type    Rx / DC Orders ED Discharge Orders     None  Claud Kelp, MD 02/26/21 1424    Lajean Saver, MD 02/26/21 1444

## 2021-02-26 NOTE — Consult Note (Addendum)
CARDIOLOGY CONSULT NOTE  Patient ID: Sean Leblanc MRN: [id number] DOB/AGE: [birth year] 61 y.o.  Admit date: 02/26/2021 Attending physician: Lajean Saver, MD  Primary Physician:  Virl Axe, MD Outpatient Cardiologist: None  Inpatient Cardiologist: Rex Kras, DO, [facility]  Reason of consultation: Abnormal EKG, shortness of breath Referring physician: Lajean Saver, MD   Chief complaint: Dizziness  HPI:  Sean Leblanc is a 60 y.o. African-American male who presents with a chief complaint of " dizziness and shortness of breath." His past medical history and cardiovascular risk factors include: TIA (August 2017), history of cardiomyopathy, hypertension, hyperlipidemia, coronary artery atherosclerosis and aortic atherosclerosis (not gated CT 06/30/2020) cigarette smoking, alcohol use.  Patient presents to the hospital with a chief complaint of dizziness and shortness of breath.  He has been having shortness of breath that is been getting progressively worse and more noticeable with effort related activities.  The symptoms usually resolve with resting.  He denies orthopnea, paroxysmal nocturnal dyspnea or lower extremity swelling.  Patient denies chest pain to suggest angina pectoris.  Last seen by cardiology in 2018 and then lost to follow-up due to insurance coverage.  EKG performed in the ED notes dynamic EKG changes with intermittent left bundle branch block and T wave inversions in the inferior leads to suggest ischemia.  Cardiology was consulted given his shortness of breath and EKG findings.  Upon further review patient has known diagnosis of cardiomyopathy most likely secondary to uncontrolled hypertension, left ventricular hypertrophy, and grade 2 diastolic dysfunction as per the last echocardiogram.  Patient states that he has been compliant with his medical therapy.  ALLERGIES: No Known Allergies  PAST MEDICAL HISTORY: Past Medical History:  Diagnosis Date    Cardiomyopathy ([place])    Hypertension    TIA (transient ischemic attack)     PAST SURGICAL HISTORY: No past surgical history on file.  FAMILY HISTORY: The patient family history includes Cancer in his father; Heart attack in his mother.   SOCIAL HISTORY:  The patient  reports that he has been smoking cigarettes. He has been smoking an average of .4 packs per day. He has never used smokeless tobacco. He reports current alcohol use. He reports that he does not currently use drugs.  MEDICATIONS: Current Outpatient Medications  Medication Instructions   atorvastatin (LIPITOR) 40 MG tablet TAKE 1 TABLET (40 MG TOTAL) BY MOUTH DAILY.   chlorthalidone (HYGROTON) 25 MG tablet TAKE 1 TABLET (25 MG TOTAL) BY MOUTH DAILY.   EQ CHILDRENS ASPIRIN 81 MG chewable tablet CHEW AND SWALLOW ONE TABLET BY MOUTH ONCE DAILY   lisinopril (ZESTRIL) 40 MG tablet TAKE 1 TABLET (40 MG TOTAL) BY MOUTH DAILY.   methocarbamol (ROBAXIN) 500 MG tablet TAKE 1 TABLET BY MOUTH TWICE DAILY.   metoprolol tartrate (LOPRESSOR) 25 mg, Oral, 2 times daily    REVIEW OF SYSTEMS: Review of Systems  Constitutional: Positive for diaphoresis. Negative for chills and fever.  HENT:  Negative for hoarse voice and nosebleeds.   Eyes:  Negative for discharge, double vision and pain.  Cardiovascular:  Positive for dyspnea on exertion. Negative for chest pain, claudication, leg swelling, near-syncope, orthopnea, palpitations, paroxysmal nocturnal dyspnea and syncope.  Respiratory:  Positive for shortness of breath. Negative for hemoptysis.   Musculoskeletal:  Negative for muscle cramps and myalgias.  Gastrointestinal:  Positive for diarrhea. Negative for abdominal pain, constipation, hematemesis, hematochezia, melena, nausea and vomiting.  Neurological:  Positive for dizziness and light-headedness.  All other systems reviewed and are negative.  PHYSICAL  EXAM: Vitals with BMI 02/26/2021 02/26/2021 02/26/2021  Height - - -  Weight - -  -  BMI - - -  Systolic 0000000 0000000 [id number]  Diastolic [id number]  Pulse 77 - -    No intake or output data in the 24 hours ending 02/26/21 1027  Net IO Since Admission: No IO data has been entered for this period [02/26/21 1027]   CONSTITUTIONAL: Well-developed and well-nourished. No acute distress.  SKIN: Skin is warm and dry. No rash noted. No cyanosis. No pallor. No jaundice HEAD: Normocephalic and atraumatic.  EYES: No scleral icterus MOUTH/THROAT: Moist oral membranes.  NECK: JVD present. No thyromegaly noted. No carotid bruits  LYMPHATIC: No visible cervical adenopathy.  CHEST Normal respiratory effort. No intercostal retractions  LUNGS: Clear to auscultation bilaterally.  No stridor. No wheezes. No rales.  CARDIOVASCULAR: Regular, positive S1-S2, no murmurs rubs or gallops appreciated. ABDOMINAL: Soft, nontender, nondistended, positive bowel sounds in all 4 quadrants, no apparent ascites.  EXTREMITIES: No peripheral edema, warm to touch bilaterally, 2+ DP and PT pulses. HEMATOLOGIC: No significant bruising NEUROLOGIC: Oriented to person, place, and time. Nonfocal. Normal muscle tone.  PSYCHIATRIC: Normal mood and affect. Normal behavior. Cooperative  RADIOLOGY: DG Chest Portable 1 View  Result Date: 02/26/2021 CLINICAL DATA:  Shortness of breath EXAM: PORTABLE CHEST 1 VIEW COMPARISON:  CT a chest 06/30/2020, chest radiographs 06/30/2020 FINDINGS: The heart is enlarged, significantly increased since the prior study though likely partially exaggerated by AP technique. The mediastinal contours are within normal limits. There is a small left pleural effusion with adjacent patchy opacities in the left base. There is no other focal opacity. There is no right pleural effusion. There is no pneumothorax. There is no acute osseous abnormality. IMPRESSION: 1. Cardiomegaly, increased since the prior study. 2. Small left pleural effusion with adjacent airspace disease. Electronically Signed   By:  Valetta Mole M.D.   On: 02/26/2021 09:05    LABORATORY DATA: Lab Results  Component Value Date   WBC 6.5 02/26/2021   HGB 13.7 02/26/2021   HCT 41.6 02/26/2021   MCV 89.3 02/26/2021   PLT 304 02/26/2021   No results for input(s): NA, K, CL, CO2, BUN, CREATININE, CALCIUM, PROT, BILITOT, ALKPHOS, ALT, AST, GLUCOSE in the last 168 hours.  Invalid input(s): LABALBU  Lipid Panel  Lab Results  Component Value Date   CHOL 114 05/23/2017   HDL 46 05/23/2017   LDLCALC 57 05/23/2017   TRIG 55 05/23/2017   CHOLHDL 2.5 05/23/2017    BNP (last 3 results) No results for input(s): BNP in the last 8760 hours.  HEMOGLOBIN A1C Lab Results  Component Value Date   HGBA1C 5.1 03/04/2016   MPG 100 03/04/2016    Cardiac Panel (last 3 results) No results for input(s): CKTOTAL, CKMB, TROPONINIHS, RELINDX in the last 72 hours.   TSH No results for input(s): TSH in the last 8760 hours.   CARDIAC DATABASE: EKG: 02/26/2021 8:36 AM: Normal sinus rhythm, 85 bpm, left bundle branch block, with minimal ST elevations in the inferior leads.  Compared to prior ECG 06/30/2020 left bundle branch block and repolarization abnormality is new.  02/26/2021 8:55 AM: Normal sinus rhythm, 70 bpm, poor R wave progression, LVH per voltage criteria, ST-T changes in the lateral leads likely secondary to repolarization abnormality, T wave inversions in the inferior leads cannot rule out inferior ischemia rare PVCs.  Compared to prior ECG minimal ST elevations in the inferior leads are resolved.  Echocardiogram: Echo  02/2016 EF 35-40%, severe concentric hypertrophy, diffuse hypokinesis  05/2017: - Left ventricle: The cavity size was normal. Wall thickness was    increased in a pattern of mild LVH. Systolic function was mildly    to moderately reduced. The estimated ejection fraction was in the    range of 40% to 45%. Diffuse hypokinesis. Features are consistent    with a pseudonormal left ventricular filling  pattern, with    concomitant abnormal relaxation and increased filling pressure    (grade 2 diastolic dysfunction).  - Aortic valve: There was no stenosis.  - Mitral valve: Mildly calcified annulus. Normal thickness leaflets    . There was no significant regurgitation.  - Left atrium: The atrium was mildly dilated.  - Right ventricle: The cavity size was normal. Systolic function    was normal.  - Pulmonary arteries: No complete TR doppler jet so unable to    estimate PA systolic pressure.  - Inferior vena cava: The vessel was normal in size. The    respirophasic diameter changes were in the normal range (>= 50%),    consistent with normal central venous pressure.  Stress Testing:  05/2017: The left ventricular ejection fraction is moderately decreased (30-44%). Nuclear stress EF: 39%. There was no ST segment deviation noted during stress. Patient developed a LBBB with the infusion of Lexiscan. This is an intermediate risk study due to reduced systolic function. No ischemia.  Heart Catheterization: None  IMPRESSION & RECOMMENDATIONS: Sean Leblanc is a 60 y.o. African-American male whose past medical history and cardiovascular risk factors include: TIA (August 2017), history of cardiomyopathy, hypertension, hyperlipidemia, coronary artery atherosclerosis and aortic atherosclerosis (not gated CT 06/30/2020) cigarette smoking, alcohol use.  Impression: Anginal equivalent. Dyspnea on exertion Abnormal EKG Intermittent left bundle branch block Cardiomyopathy, presumed nonischemic Hypertensive urgency Hyperlipidemia Coronary artery atherosclerosis Aortic atherosclerosis Cigarette smoking Alcohol use, excessive  Plan: Patient presents to the hospital with progressive shortness of breath with effort related activities suggestive of possible angina equivalent, dynamic EKG changes, multiple cardiovascular risk factors including hypertension, hyperlipidemia, coronary artery  atherosclerosis, smoker.  He has had prior work-up in 2018 which notes mildly reduced LVEF, global hypokinesis, LVH, grade 2 diastolic impairment.  Outpatient follow-up has been difficult due to insurance coverage and has been lost to follow-up since 2018.  Due to his symptoms, EKG findings, discussed ischemic evaluation with regards to proceeding with coronary CTA versus left heart catheterization.  After discussing the risks, benefits, and limitations patient wishes to proceed with left heart catheterization with possible intervention.  The procedure of left heart catheterization with possible intervention was explained to the patient in detail. The indication, alternatives, risks and benefits were reviewed.  Complications include but not limited to bleeding, infection, vascular injury, stroke, myocardial infection, arrhythmia, kidney injury requiring short-term/long-term dialysis, radiation-related injury in the case of prolonged fluoroscopy use, emergency cardiac surgery, and death. The patient understands the risks of serious complication is 1-2 in [id number] with diagnostic cardiac cath and 1-2% or less with angioplasty/stenting.   The patient  voices understanding and provides verbal feedback and wishes to proceed with coronary angiography with possible PCI.  High sensitive troponins, pending.  Check hemoglobin A1c, UDS, and a fasting lipid profile in the morning.  Plan echocardiogram.  IV heparin per ACS protocol.  Recommend restarting home medications.  Defer blood pressure management to primary team.  We will follow  Initiate aspirin 81 mg p.o. daily starting 08/20 given the history of TIA, aortic atherosclerosis and coronary  calcification.  Educated the patient on the importance of complete smoking cessation.  And also encouraged to reduce alcohol intake.  Further recommendations to follow.  Case discussed with ED physician.  Continue your care given his other chronic comorbid  conditions.  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   CRITICAL CARE Performed by: Rex Kras   Total critical care time: 61 minutes   Critical care time was exclusive of separately billable procedures and treating other patients.   Critical care was necessary to treat or prevent imminent or life-threatening deterioration.   Critical care was time spent personally by me on the following activities: development of treatment plan with patient  as well as ED physician, evaluation of patient's response to treatment, examination of patient, obtaining history from patient or surrogate, ordering and performing treatments and interventions, ordering and review of laboratory studies, ordering and review of radiographic studies, pulse oximetry and re-evaluation of patient's condition.  [facility]  Pager: [phone] Office: [phone] 02/26/2021, 10:27 AM   Addendum: Was called by ED physician stating the patient was more short of breath after returning from CT.  Blood pressure elevated.  Started on nitro drip and recommended reevaluation.  Patient's initial troponin resulted as [id number] followed by [id number] since he was last evaluated.  UDS also came back positive for cocaine and marijuana.  Of note he denied illicit drug use on presentation.  Patient will be taken to the Cath Lab sooner than originally planned to further evaluate his coronaries.  I suspect his current symptoms are secondary to his underlying cardiomyopathy, hypertensive urgency, and polysubstance abuse.  Patient ruled in for non-STEMI.  Will give Lasix 40 mEq IV push x1.  CRITICAL CARE Performed by: Rex Kras   Total critical care time: 35 minutes   Critical care time was exclusive of separately billable procedures and treating other patients.   Critical care was necessary to treat or prevent imminent or life-threatening deterioration.   Critical care was time spent personally by me  on the following activities: development of treatment plan with patient and/or surrogate as well as nursing, discussions with consultants, evaluation of patient's response to treatment, examination of patient, obtaining history from patient or surrogate, ordering and performing treatments and interventions, ordering and review of laboratory studies, ordering and review of radiographic studies, pulse oximetry and re-evaluation of patient's condition.  Rex Kras, DO, [facility] Pager: [phone] Office: [phone]   [facility]  Pager: [phone] Office: [phone] 02/26/21 11:49 AM

## 2021-02-26 NOTE — ED Notes (Signed)
Trop. Results given to Dr. Ashok Cordia.

## 2021-02-26 NOTE — ED Notes (Signed)
Patient returned from CT c/o increased sob, states he feels like he is getting more congestion in his chest. o2 sat87 % on RA placed patient on 02 at 4l/[place], denies chest pain. Dr. Ashok Cordia at bedside.

## 2021-02-26 NOTE — ED Notes (Signed)
Cards at bedside

## 2021-02-27 ENCOUNTER — Other Ambulatory Visit: Payer: Self-pay

## 2021-02-27 ENCOUNTER — Encounter (HOSPITAL_COMMUNITY): Payer: Self-pay | Admitting: Cardiology

## 2021-02-27 ENCOUNTER — Other Ambulatory Visit (HOSPITAL_COMMUNITY): Payer: Self-pay

## 2021-02-27 DIAGNOSIS — I5021 Acute systolic (congestive) heart failure: Secondary | ICD-10-CM

## 2021-02-27 DIAGNOSIS — I5022 Chronic systolic (congestive) heart failure: Secondary | ICD-10-CM

## 2021-02-27 LAB — BASIC METABOLIC PANEL
Anion gap: 10 (ref 5–15)
BUN: 11 mg/dL (ref 6–20)
CO2: 24 mmol/L (ref 22–32)
Calcium: 9 mg/dL (ref 8.9–10.3)
Chloride: 99 mmol/L (ref 98–111)
Creatinine, Ser: 1.05 mg/dL (ref 0.61–1.24)
GFR, Estimated: >60 mL/min (ref >60)
Glucose, Bld: 109 mg/dL — ABNORMAL HIGH (ref 70–99)
Potassium: 3.6 mmol/L (ref 3.5–5.1)
Sodium: 133 mmol/L — ABNORMAL LOW (ref 135–145)

## 2021-02-27 LAB — LIPID PANEL
Cholesterol: 192 mg/dL (ref 0–200)
HDL: 49 mg/dL (ref >40)
LDL Cholesterol: 129 mg/dL — ABNORMAL HIGH (ref 0–99)
Total CHOL/HDL Ratio: 3.9 RATIO
Triglycerides: 68 mg/dL (ref <150)
VLDL: 14 mg/dL (ref 0–40)

## 2021-02-27 LAB — MAGNESIUM: Magnesium: 2 mg/dL (ref 1.7–2.4)

## 2021-02-27 MED ORDER — POTASSIUM CHLORIDE CRYS ER 20 MEQ PO TBCR
20.0000 meq | EXTENDED_RELEASE_TABLET | Freq: Once | ORAL | Status: AC
  Administered 2021-02-27: 20 meq via ORAL
  Filled 2021-02-27: qty 1

## 2021-02-27 MED ORDER — ASPIRIN EC 81 MG PO TBEC
81.0000 mg | DELAYED_RELEASE_TABLET | Freq: Every day | ORAL | Status: DC
  Administered 2021-02-27 – 2021-02-28 (×2): 81 mg via ORAL
  Filled 2021-02-27 (×2): qty 1

## 2021-02-27 MED ORDER — ATORVASTATIN CALCIUM 40 MG PO TABS
40.0000 mg | ORAL_TABLET | Freq: Every day | ORAL | Status: DC
  Administered 2021-02-27 – 2021-02-28 (×2): 40 mg via ORAL
  Filled 2021-02-27 (×2): qty 1

## 2021-02-27 MED ORDER — SPIRONOLACTONE 25 MG PO TABS
25.0000 mg | ORAL_TABLET | Freq: Every day | ORAL | Status: DC
  Administered 2021-02-27: 25 mg via ORAL
  Filled 2021-02-27: qty 1

## 2021-02-27 NOTE — Progress Notes (Addendum)
Subjective:  Patient resting comfortably in bed.  Denies chest pain or dyspnea.  He states both have resolved since admission. Denies dizziness, orthopnea.  Reviewed and discussed with patient results of echocardiogram and cardiac catheterization.  Intake/Output from previous day:  I/O last 3 completed shifts: In: 330.3 [P.O.:240; I.V.:90.3] Out: 2605 [Urine:2605] Total I/O In: 200 [P.O.:200] Out: 250 [Urine:250]  Blood pressure 104/70, pulse 70, temperature 98.2 F (36.8 C), temperature source Oral, resp. rate 16, height '5\' 11"'  (1.803 m), weight 65.8 kg, SpO2 97 %. Physical Exam Vitals reviewed.  HENT:     Head: Normocephalic and atraumatic.  Cardiovascular:     Rate and Rhythm: Normal rate and regular rhythm.     Pulses: Intact distal pulses.     Heart sounds: S1 normal and S2 normal. No murmur heard.   No gallop.     Comments: Radial access site dressing intact.  2+ radial pulses bilaterally. Pulmonary:     Effort: Pulmonary effort is normal. No respiratory distress.     Breath sounds: No wheezing, rhonchi or rales.  Musculoskeletal:     Right lower leg: No edema.     Left lower leg: No edema.  Neurological:     Mental Status: He is alert.    Lab Results: BMP BNP (last 3 results) Recent Labs    02/26/21 0827  BNP 1,596.6*    ProBNP (last 3 results) No results for input(s): PROBNP in the last 8760 hours. BMP Latest Ref Rng & Units 02/27/2021 02/26/2021 06/30/2020  Glucose 70 - 99 mg/dL 109(H) 117(H) 100(H)  BUN 6 - 20 mg/dL '11 11 16  ' Creatinine 0.61 - 1.24 mg/dL 1.05 0.96 0.89  BUN/Creat Ratio 9 - 20 - - -  Sodium 135 - 145 mmol/L 133(L) 134(L) 136  Potassium 3.5 - 5.1 mmol/L 3.6 3.7 3.9  Chloride 98 - 111 mmol/L 99 102 102  CO2 22 - 32 mmol/L '24 22 24  ' Calcium 8.9 - 10.3 mg/dL 9.0 9.0 9.7   Hepatic Function Latest Ref Rng & Units 03/03/2016  Total Protein 6.5 - 8.1 g/dL 7.7  Albumin 3.5 - 5.0 g/dL 4.1  AST 15 - 41 U/L 27  ALT 17 - 63 U/L 20  Alk  Phosphatase 38 - 126 U/L 49  Total Bilirubin 0.3 - 1.2 mg/dL 0.8   CBC Latest Ref Rng & Units 02/26/2021 06/30/2020 03/03/2016  WBC 4.0 - 10.5 K/uL 6.5 7.0 -  Hemoglobin 13.0 - 17.0 g/dL 13.7 14.8 15.6  Hematocrit 39.0 - 52.0 % 41.6 47.5 46.0  Platelets 150 - 400 K/uL 304 289 -   Lipid Panel     Component Value Date/Time   CHOL 192 02/27/2021 0620   CHOL 114 05/23/2017 1559   TRIG 68 02/27/2021 0620   HDL 49 02/27/2021 0620   HDL 46 05/23/2017 1559   CHOLHDL 3.9 02/27/2021 0620   VLDL 14 02/27/2021 0620   LDLCALC 129 (H) 02/27/2021 0620   LDLCALC 57 05/23/2017 1559   Cardiac Panel (last 3 results) No results for input(s): CKTOTAL, CKMB, TROPONINI, RELINDX in the last 72 hours.  HEMOGLOBIN A1C Lab Results  Component Value Date   HGBA1C 5.0 02/26/2021   MPG 96.8 02/26/2021   TSH Recent Labs    02/26/21 1151  TSH 1.394   Imaging: Chest x-ray 02/26/2021: The heart is enlarged, significantly increased since the prior study though likely partially exaggerated by AP technique. The mediastinal contours are within normal limits.   There is a small left pleural  effusion with adjacent patchy opacities in the left base. There is no other focal opacity. There is no right pleural effusion. There is no pneumothorax.  CT angio of the chest 02/26/2021: No evidence of pulmonary embolism. Emphysematous changes and significant peribronchial thickening consistent with bronchitis/COPD. Small BILATERAL pleural effusions and bibasilar atelectasis. Scattered atherosclerotic calcifications including coronary arteries. Aortic Atherosclerosis (ICD10-I70.0) and Emphysema (ICD10-J43.9).  Cardiac Studies: Echocardiogram Echo 02/2016 EF 35-40%, severe concentric hypertrophy, diffuse hypokinesis   05/2017: - Left ventricle: The cavity size was normal. Wall thickness was    increased in a pattern of mild LVH. Systolic function was mildly    to moderately reduced. The estimated ejection fraction  was in the    range of 40% to 45%. Diffuse hypokinesis. Features are consistent    with a pseudonormal left ventricular filling pattern, with    concomitant abnormal relaxation and increased filling pressure    (grade 2 diastolic dysfunction).  - Aortic valve: There was no stenosis.  - Mitral valve: Mildly calcified annulus. Normal thickness leaflets    . There was no significant regurgitation.  - Left atrium: The atrium was mildly dilated.  - Right ventricle: The cavity size was normal. Systolic function    was normal.  - Pulmonary arteries: No complete TR doppler jet so unable to    estimate PA systolic pressure.  - Inferior vena cava: The vessel was normal in size. The    respirophasic diameter changes were in the normal range (>= 50%),    consistent with normal central venous pressure.  02/26/2021:  1. Left ventricular ejection fraction, by estimation, is 20 to 25%. Left ventricular ejection fraction by 2D MOD biplane is 32.3 %. The left ventricle has severely decreased function. The left ventricle demonstrates global hypokinesis. The left ventricular internal cavity size was mildly dilated. There is mild left ventricular hypertrophy. Left ventricular diastolic function could not be evaluated.   2. Right ventricular systolic function is normal. The right ventricular size is normal. Mildly increased right ventricular wall thickness. There is normal pulmonary artery systolic pressure. The estimated right ventricular systolic pressure is 40.1 mmHg.   3. Left atrial size was mildly dilated.   4. Right atrial size was mildly dilated.   5. The mitral valve is normal in structure. Trivial mitral valve  regurgitation. No evidence of mitral stenosis.   6. Tricuspid valve regurgitation is mild to moderate.   7. The aortic valve is normal in structure. Aortic valve regurgitation is not visualized. No aortic stenosis is present.   8. The inferior vena cava is normal in size with greater than 50%  respiratory variability, suggesting right atrial pressure of 3 mmHg.    Stress Testing:  05/2017: The left ventricular ejection fraction is moderately decreased (30-44%). Nuclear stress EF: 39%. There was no ST segment deviation noted during stress. Patient developed a LBBB with the infusion of Lexiscan. This is an intermediate risk study due to reduced systolic function. No ischemia.   Heart Catheterization: LHC 02/26/2021:  LV: 182/0, EDP 13 mmHg.  Aortic pressure 170/87, mean 117 mmHg.  There was no pressure gradient across the aortic valve. LVEF: Global hypokinesis, size upper limit of normal, EF 20 to 25%. Coronary arteries: Very mild disease in the ramus intermediate constituting 30% otherwise minimal disease in the proximal and mid LAD of 10% and circumflex proximal and mid 10%.  Right coronary artery is normal. LM: Smooth and normal. LAD: Large vessel, gives origin to a moderate-sized D1 and several small  diagonals, has minimal disease in the proximal segment. CX: Large vessel, has mild luminal irregularity in the proximal segment.   RI: Mild diffuse luminal irregularity.  Constitutes a proximal 20 to 30% stenosis. RCA: Dominant.  Normal.  EKG:  02/26/2021 8:36 AM: Normal sinus rhythm, 85 bpm, left bundle branch block, with minimal ST elevations in the inferior leads.  Compared to prior ECG 06/30/2020 left bundle branch block and repolarization abnormality is new.   02/26/2021 8:55 AM: Normal sinus rhythm, 70 bpm, poor R wave progression, LVH per voltage criteria, ST-T changes in the lateral leads likely secondary to repolarization abnormality, T wave inversions in the inferior leads cannot rule out inferior ischemia rare PVCs.  Compared to prior ECG minimal ST elevations in the inferior leads are resolved.  Scheduled Meds:  bumetanide (BUMEX) IV  1 mg Intravenous Daily   carvedilol  6.25 mg Oral BID WC   dapagliflozin propanediol  10 mg Oral Daily   heparin  5,000 Units  Subcutaneous Q8H   hydrALAZINE  25 mg Oral Q8H   irbesartan  150 mg Oral Daily   melatonin  3 mg Oral QHS   sodium chloride flush  3 mL Intravenous Q12H   Continuous Infusions:  sodium chloride     PRN Meds:.sodium chloride, acetaminophen, ondansetron (ZOFRAN) IV, sodium chloride flush  Assessment/Plan:  Sean Leblanc is a 60 y.o. African-American male who presents with a chief complaint of " dizziness and shortness of breath." His past medical history and cardiovascular risk factors include: TIA (August 2017), history of cardiomyopathy, hypertension, hyperlipidemia, coronary artery atherosclerosis and aortic atherosclerosis (not gated CT 06/30/2020) cigarette smoking, alcohol use.   Nonischemic cardiomyopathy, systolic heart failure (LVEF 20-25%) Educated patient regarding pathophysiology of heart failure.  As well as the importance of tobacco cessation and reduced alcohol consumption.  Discussed heart healthy low-sodium diet and importance of medication compliance. Continue Farxiga 10 mg daily Coreg 6.5 mg twice daily.  Patient is not on metoprolol due to UDS + for cocaine. Patient was on lisinopril at home, states last dose was 02/26/2019 2 at night. We will plan to transition patient to [facility] tomorrow following 36-hour washout period from lisinopril. Will discontinue hydralazine and add spironolactone 25 mg daily Monitor renal function and electrolytes.  Replaced potassium.   Mild nonobstructive CAD Continue Coreg 6.25 mg twice daily Continue irbesartan 150 mg daily Start atorvastatin 40 mg daily Start aspirin 81 mg daily Reviewed and discussed with patient regarding moderate CAD noted on cardiac catheterization. Reiterated importance of diet and lifestyle modifications. Details of cardiac catheterization above  Hypertensive urgency Improved Continue Coreg Discontinue hydralazine. Start spironolactone 25 mg daily Patient takes lisinopril at home, last dose on the night of  02/25/2021.  We will plan to transition to [facility] tomorrow.  Hyperlipidemia LDL 129 Start atorvastatin 40 mg daily.  Plan for repeat lipid profile testing in 2 to 3 months.  Aortic atherosclerosis Initiate aspirin and statin therapy  Polysubstance abuse Reiterated the importance of smoking cessation Reiterated the importance of reduced alcohol consumption Reiterated the importance of avoiding cocaine use.  If patient tolerates up titration of guideline directed medical therapy well, will hopefully plan for discharge Monday.  Patient was seen in collaboration with Dr. Terri Skains. He also reviewed patient's chart and examined the patient. Dr. Terri Skains is in agreement of the plan.     Alethia Berthold, PA-C 02/27/2021, 8:46 AM Office: [phone]  ADDENDUM:   Shared Visit: Patient was independently interviewed and examined by me. This  has been a shared visit with Lawerance Cruel, PA & Dr. Terri Skains. I reviewed the above and agree the findings and recommendations, unless noted differently below.   Resting in bed comfortably denies any chest pain or shortness of breath.   PHYSICAL EXAM: Vitals with BMI 02/27/2021 02/27/2021 02/27/2021  Height - - -  Weight - - -  BMI - - -  Systolic 038 882 800  Diastolic 69 70 97  Pulse 67 70 67   CONSTITUTIONAL: Well-developed and well-nourished. No acute distress.  SKIN: Skin is warm and dry. No rash noted. No cyanosis. No pallor. No jaundice HEAD: Normocephalic and atraumatic.  EYES: No scleral icterus MOUTH/THROAT: Moist oral membranes.  NECK: No JVD present. No thyromegaly noted. No carotid bruits  LYMPHATIC: No visible cervical adenopathy.  CHEST Normal respiratory effort. No intercostal retractions  LUNGS: Clear to auscultation bilaterally.  No stridor. No wheezes. No rales.  CARDIOVASCULAR: Regular, positive S1-S2, no murmurs rubs or gallops appreciated. ABDOMINAL: Soft, nontender, nondistended, positive bowel sounds in all 4 quadrants no  apparent ascites.  EXTREMITIES: No peripheral edema, 2+ DP/PT pulses, warm to touch. HEMATOLOGIC: No significant bruising NEUROLOGIC: Oriented to person, place, and time. Nonfocal. Normal muscle tone.  PSYCHIATRIC: Normal mood and affect. Normal behavior. Cooperative    12 lead EKG was personally reviewed by me:  02/27/2021: Normal sinus, 62 bpm, biatrial enlargement, left bundle branch block, LAE,, T wave inversions in inferior leads cannot rule out ischemia.   Assessment/Plan: Nonischemic cardiomyopathy: Echocardiogram and left heart catheterization results reviewed with the patient in great detail. EKG independently reviewed and noted above. Currently patient is chest pain-free. Educated on importance of guideline directed medical therapy has his laboratory values and dynamics allow. Patient understands that he needs a repeat echocardiogram in 90 days after being on appropriate guideline directed medical therapy to see if his LVEF improves or refer to EP for possible AICD implantation.  Acute heart failure with reduced EF, stage C, NYHA class II Will initiate Entresto after lisinopril washout, tentatively plan for tomorrow Continue Farxiga. Continue carvedilol, not a candidate for Toprol-XL given his cocaine use Will discontinue hydralazine and initiate spironolactone Monitor blood pressures Check BNP in the morning  Mild nonobstructive CAD: Continue guideline directed/antianginal therapy.  Further recommendations to follow as the case evolves.   This note was created using a voice recognition software as a result there may be grammatical errors inadvertently enclosed that do not reflect the nature of this encounter. Every attempt is made to correct such errors.   Rex Kras, [place], [facility]  Pager: [phone] Office: [phone]

## 2021-02-28 ENCOUNTER — Encounter (HOSPITAL_COMMUNITY): Payer: Self-pay | Admitting: Cardiology

## 2021-02-28 LAB — MAGNESIUM: Magnesium: 2.3 mg/dL (ref 1.7–2.4)

## 2021-02-28 LAB — BASIC METABOLIC PANEL
Anion gap: 11 (ref 5–15)
BUN: 18 mg/dL (ref 6–20)
CO2: 25 mmol/L (ref 22–32)
Calcium: 9.1 mg/dL (ref 8.9–10.3)
Chloride: 97 mmol/L — ABNORMAL LOW (ref 98–111)
Creatinine, Ser: 1.11 mg/dL (ref 0.61–1.24)
GFR, Estimated: >60 mL/min (ref >60)
Glucose, Bld: 94 mg/dL (ref 70–99)
Potassium: 3.7 mmol/L (ref 3.5–5.1)
Sodium: 133 mmol/L — ABNORMAL LOW (ref 135–145)

## 2021-02-28 LAB — BRAIN NATRIURETIC PEPTIDE: B Natriuretic Peptide: 128.1 pg/mL — ABNORMAL HIGH (ref 0.0–100.0)

## 2021-02-28 MED ORDER — BUMETANIDE 1 MG PO TABS
1.0000 mg | ORAL_TABLET | Freq: Every day | ORAL | Status: DC
  Administered 2021-02-28: 1 mg via ORAL
  Filled 2021-02-28: qty 1

## 2021-02-28 MED ORDER — SACUBITRIL-VALSARTAN 49-51 MG PO TABS
1.0000 | ORAL_TABLET | Freq: Two times a day (BID) | ORAL | Status: DC
  Administered 2021-02-28: 1 via ORAL
  Filled 2021-02-28 (×2): qty 1

## 2021-02-28 MED ORDER — MELATONIN 3 MG PO TABS: 3.0000 mg | ORAL_TABLET | Freq: Every day | ORAL | 0 refills | Status: DC

## 2021-02-28 MED ORDER — CARVEDILOL 6.25 MG PO TABS: 6.2500 mg | ORAL_TABLET | Freq: Two times a day (BID) | ORAL | 0 refills | Status: DC

## 2021-02-28 MED ORDER — POTASSIUM CHLORIDE 10 MEQ/100ML IV SOLN
10.0000 meq | INTRAVENOUS | Status: AC
Start: 2021-02-28 — End: 2021-02-28
  Administered 2021-02-28 (×2): 10 meq via INTRAVENOUS
  Filled 2021-02-28: qty 100

## 2021-02-28 MED ORDER — DAPAGLIFLOZIN PROPANEDIOL 10 MG PO TABS: 10.0000 mg | ORAL_TABLET | Freq: Every day | ORAL | 0 refills | Status: DC

## 2021-02-28 MED ORDER — SPIRONOLACTONE 25 MG PO TABS: 25.0000 mg | ORAL_TABLET | Freq: Every day | ORAL | 0 refills | Status: DC

## 2021-02-28 MED ORDER — SACUBITRIL-VALSARTAN 49-51 MG PO TABS
1.0000 | ORAL_TABLET | Freq: Two times a day (BID) | ORAL | 0 refills | Status: DC
Start: 2021-02-28 — End: 2021-03-11

## 2021-02-28 MED ORDER — BUMETANIDE 1 MG PO TABS: 1.0000 mg | ORAL_TABLET | Freq: Every day | ORAL | 0 refills | Status: DC

## 2021-02-28 NOTE — Discharge Summary (Signed)
Physician Discharge Summary  Patient ID: Sean Leblanc MRN: [id number] DOB/AGE: [birth year] 61 y.o.  Admit date: 02/26/2021 Discharge date: 02/28/2021  Primary Discharge Diagnosis: Non-STEMI Nonischemic cardiomyopathy, LVEF 20-25% acute Acute heart failure with reduced EF, stage C, NYHA class II Mild nonobstructive CAD Hypertensive urgency on presentation Cocaine use Marijuana use  Secondary Discharge Diagnosis: Hyperlipidemia Aortic atherosclerosis  Hospital Course:   60 y.o. African-American male  with non-STEMI (02/2021), nonischemic cardiomyopathy, acute HFrEF (02/2021), TIA (August 2017), history of cardiomyopathy, hypertension, hyperlipidemia, coronary artery atherosclerosis and aortic atherosclerosis (not gated CT 06/30/2020) cigarette smoking, alcohol use, cocaine use, marijuana use.   Patient presented to the hospital with a chief complaint of dizziness and shortness of breath.  The symptoms have been getting progressively worse and more noticeable with top related activities.  Symptoms resolved with resting.  However denied orthopnea, PND, or lower extremity swelling.  He has been lost to follow-up from a cardiovascular standpoint as of 2018 when he was noted to have mildly reduced left ventricular systolic function.  EKG in the ED noted intermittent left bundle branch block and T wave inversions in the inferior leads suggestive of ischemia.  Given his symptoms, dynamic EKG changes, troponins patient was taken to the Cath Lab urgently to evaluate his coronaries.  He was noted to have mild nonobstructive CAD and severely reduced LVEF by angiography as well as echocardiogram.  No coronary interventions were performed.  He was transferred to medical floors for up titration of guideline directed medical therapy and improving his modifiable cardiovascular risk factors.  Patient has been on the medical floor for greater than 24 hours and has not had any chest pain or shortness of  breath.  His guideline directed medical therapy has been uptitrated, blood pressures are better controlled.  Patient wishes to go home.  Discharge Exam: PHYSICAL EXAM: Vitals with BMI 02/28/2021 02/27/2021 02/27/2021  Height - - -  Weight 123 lbs 3 oz - -  BMI [id number] - -  Systolic [id number]  Diastolic 86 83 74  Pulse 56 60 75   Net IO Since Admission: -3,909.69 mL [02/28/21 1337]  CONSTITUTIONAL: Well-developed and well-nourished. No acute distress.  SKIN: Skin is warm and dry. No rash noted. No cyanosis. No pallor. No jaundice HEAD: Normocephalic and atraumatic.  EYES: No scleral icterus MOUTH/THROAT: Moist oral membranes.  NECK: No JVD present. No thyromegaly noted. No carotid bruits  LYMPHATIC: No visible cervical adenopathy.  CHEST Normal respiratory effort. No intercostal retractions  LUNGS: Clear to auscultation bilaterally.  No stridor. No wheezes. No rales.  CARDIOVASCULAR: Regular rate and rhythm, positive S1-S2, no murmurs rubs or gallops appreciated. ABDOMINAL: Nonobese, soft, nontender, nondistended no apparent ascites.  EXTREMITIES: No peripheral edema, warm to touch, 2+ DP and PT pulses. HEMATOLOGIC: No significant bruising NEUROLOGIC: Oriented to person, place, and time. Nonfocal. Normal muscle tone.  PSYCHIATRIC: Normal mood and affect. Normal behavior. Cooperative   RECOMMENDATIONS ON DISCHARGE:   Acute/chronic heart failure with reduced EF, stage C, NYHA class II: Newly diagnosed. Clinically appears euvolemic Negative urine output of approximately 4 L since admission Started on Farxiga, Entresto after ACE inhibitor washout, spironolactone Lopressor transitioned to carvedilol given his use of cocaine Continue aspirin and statin therapy. Will need repeat echocardiogram in 90 days after being on appropriate guideline medical directed therapy to reevaluate LVEF to see if he is a candidate for AICD for primary prevention of sudden cardiac death.  Patient verbalizes  understanding that he needs close cardiac follow-up once  discharged. Plan to see him back in the office in 7 days post discharge. Prescription sent to his pharmacy and provided assistance co-pay cards for both Delene Loll and Farxiga.  If he has any difficulty patient is asked to reach out to the office tomorrow 03/01/2021.  Nonischemic cardiomyopathy: Suspect secondary to polysubstance abuse, uncontrolled hypertension, etc. See above  Non-STEMI: Given the dynamic EKG changes, shortness of breath, and peak troponin of 6083 patient was taken to Cath Lab urgently to evaluate for obstructive CAD. No significant epicardial coronary artery disease, mild nonobstructive disease Continue aspirin and statin therapy Educated on importance of secondary prevention Educated on the importance of complete cessation of smoking nicotine products, cocaine, and alcohol use  Left bundle branch block: Continue to monitor.  CARDIOVASCULAR DATABASE: EKG: 02/26/2021 8:36 AM: Normal sinus rhythm, 85 bpm, left bundle branch block, with minimal ST elevations in the inferior leads.  Compared to prior ECG 06/30/2020 left bundle branch block and repolarization abnormality is new.   02/26/2021 8:55 AM: Normal sinus rhythm, 70 bpm, poor R wave progression, LVH per voltage criteria, ST-T changes in the lateral leads likely secondary to repolarization abnormality, T wave inversions in the inferior leads cannot rule out inferior ischemia rare PVCs.  Compared to prior ECG minimal ST elevations in the inferior leads are resolved.   Echocardiogram: Echo 02/2016 EF 35-40%, severe concentric hypertrophy, diffuse hypokinesis   05/2017: LVEF 40-45%, diffuse global hypokinesis, mild LVH, grade 2 diastolic impairment, elevated filling pressures, mild left atrial enlargement.  02/26/2021:  1. Left ventricular ejection fraction, by estimation, is 20 to 25%. Left ventricular ejection fraction by 2D MOD biplane is 32.3 %. The left ventricle  has severely decreased function. The left ventricle demonstrates global hypokinesis. The left ventricular internal cavity size was mildly dilated. There is mild left ventricular hypertrophy. Left ventricular diastolic function could not be evaluated.   2. Right ventricular systolic function is normal. The right ventricular size is normal. Mildly increased right ventricular wall thickness. There is normal pulmonary artery systolic pressure. The estimated right ventricular systolic pressure is 0000000 mmHg.   3. Left atrial size was mildly dilated.   4. Right atrial size was mildly dilated.   5. The mitral valve is normal in structure. Trivial mitral valve  regurgitation. No evidence of mitral stenosis.   6. Tricuspid valve regurgitation is mild to moderate.   7. The aortic valve is normal in structure. Aortic valve regurgitation is not visualized. No aortic stenosis is present.   8. The inferior vena cava is normal in size with greater than 50% respiratory variability, suggesting right atrial pressure of 3 mmHg.    Stress Testing:  05/2017: The left ventricular ejection fraction is moderately decreased (30-44%). Nuclear stress EF: 39%. There was no ST segment deviation noted during stress. Patient developed a LBBB with the infusion of Lexiscan. This is an intermediate risk study due to reduced systolic function. No ischemia.   Heart Catheterization: LHC 02/26/2021:  LV: 182/0, EDP 13 mmHg.  Aortic pressure 170/87, mean 117 mmHg.  There was no pressure gradient across the aortic valve. LVEF: Global hypokinesis, size upper limit of normal, EF 20 to 25%. Coronary arteries: Very mild disease in the ramus intermediate constituting 30% otherwise minimal disease in the proximal and mid LAD of 10% and circumflex proximal and mid 10%.  Right coronary artery is normal. LM: Smooth and normal. LAD: Large vessel, gives origin to a moderate-sized D1 and several small diagonals, has minimal disease in the  proximal  segment. CX: Large vessel, has mild luminal irregularity in the proximal segment.   RI: Mild diffuse luminal irregularity.  Constitutes a proximal 20 to 30% stenosis. RCA: Dominant.  Normal.  Labs:   Lab Results  Component Value Date   WBC 6.5 02/26/2021   HGB 13.7 02/26/2021   HCT 41.6 02/26/2021   MCV 89.3 02/26/2021   PLT 304 02/26/2021    Recent Labs  Lab 02/28/21 0254  NA 133*  K 3.7  CL 97*  CO2 25  BUN 18  CREATININE 1.11  CALCIUM 9.1  GLUCOSE 94    Lipid Panel  Lab Results  Component Value Date   CHOL 192 02/27/2021   HDL 49 02/27/2021   LDLCALC 129 (H) 02/27/2021   TRIG 68 02/27/2021   CHOLHDL 3.9 02/27/2021   BNP (last 3 results) Recent Labs    02/26/21 0827  BNP 1,596.6*    HEMOGLOBIN A1C Lab Results  Component Value Date   HGBA1C 5.0 02/26/2021   MPG 96.8 02/26/2021    Cardiac Panel (last 3 results) Recent Labs    02/26/21 0826 02/26/21 1025  TROPONINIHS 3,038* 6,083*     TSH Recent Labs    02/26/21 1151  TSH 1.394    Radiology: CT Angio Chest PE W and/or Wo Contrast  Result Date: 02/26/2021 CLINICAL DATA:  Cough, shortness of breath, elevated D-dimer, question pulmonary embolism, history hypertension, cardiomyopathy, TIA, smoker, coronary artery disease EXAM: CT ANGIOGRAPHY CHEST WITH CONTRAST TECHNIQUE: Multidetector CT imaging of the chest was performed using the standard protocol during bolus administration of intravenous contrast. Multiplanar CT image reconstructions and MIPs were obtained to evaluate the vascular anatomy. CONTRAST:  27m OMNIPAQUE IOHEXOL 350 MG/ML SOLN IV COMPARISON:  06/30/2020 FINDINGS: Cardiovascular: Atherosclerotic calcifications aorta, proximal great vessels, and coronary arteries. Enlargement of cardiac chambers. Ascending thoracic aorta upper normal caliber. Pulmonary arteries well opacified and patent. No evidence of pulmonary embolism. Mediastinum/Nodes: Esophagus unremarkable. No thoracic  adenopathy. Base of cervical region normal appearance. Lungs/Pleura: Small BILATERAL pleural effusions. Calcified granuloma RIGHT upper lobe. Emphysematous changes upper lobes. Minimal basilar atelectasis and dependent density. No definite acute infiltrate or pneumothorax. Central peribronchial thickening present. Upper Abdomen: Unremarkable Musculoskeletal: Unremarkable Review of the MIP images confirms the above findings. IMPRESSION: No evidence of pulmonary embolism. Emphysematous changes and significant peribronchial thickening consistent with bronchitis/COPD. Small BILATERAL pleural effusions and bibasilar atelectasis. Scattered atherosclerotic calcifications including coronary arteries. Aortic Atherosclerosis (ICD10-I70.0) and Emphysema (ICD10-J43.9). Electronically Signed   By: MLavonia DanaM.D.   On: 02/26/2021 12:18   CARDIAC CATHETERIZATION  Result Date: 02/26/2021 Left Heart Catheterization 02/26/21: LV: 182/0, EDP 13 mmHg.  Aortic pressure 170/87, mean 117 mmHg.  There was no pressure gradient across the aortic valve. LVEF: Global hypokinesis, size upper limit of normal, EF 20 to 25%. Coronary arteries: Very mild disease in the ramus intermediate constituting 30% otherwise minimal disease in the proximal and mid LAD of 10% and circumflex proximal and mid 10%.  Right coronary artery is normal. LM: Smooth and normal. LAD: Large vessel, gives origin to a moderate-sized D1 and several small diagonals, has minimal disease in the proximal segment. CX: Large vessel, has mild luminal irregularity in the proximal segment.  RI: Mild diffuse luminal irregularity.  Constitutes a proximal 20 to 30% stenosis. RCA: Dominant.  Normal.   DG Chest Portable 1 View  Result Date: 02/26/2021 CLINICAL DATA:  Shortness of breath EXAM: PORTABLE CHEST 1 VIEW COMPARISON:  CT a chest 06/30/2020, chest radiographs 06/30/2020 FINDINGS: The heart  is enlarged, significantly increased since the prior study though likely partially  exaggerated by AP technique. The mediastinal contours are within normal limits. There is a small left pleural effusion with adjacent patchy opacities in the left base. There is no other focal opacity. There is no right pleural effusion. There is no pneumothorax. There is no acute osseous abnormality. IMPRESSION: 1. Cardiomegaly, increased since the prior study. 2. Small left pleural effusion with adjacent airspace disease. Electronically Signed   By: Valetta Mole M.D.   On: 02/26/2021 09:05   ECHOCARDIOGRAM COMPLETE  Result Date: 02/26/2021    ECHOCARDIOGRAM REPORT   Patient Name:   Sean Leblanc Date of Exam: 02/26/2021 Medical Rec #:  [id number]       Height:       71.0 in Accession #:    [id number]      Weight:       145.0 lb Date of Birth:  [birth year]       BSA:          1.839 m Patient Age:    67 years        BP:           135/105 mmHg Patient Gender: M               HR:           59 bpm. Exam Location:  Inpatient Procedure: 2D Echo, Cardiac Doppler and Color Doppler Indications:     Dyspnea R06.00                  Abnormal ECG R94.31  History:         Patient has prior history of Echocardiogram examinations, most                  recent 06/06/2017. Cardiomyopathy, TIA; Risk                  Factors:Hypertension.  Sonographer:     Bernadene Person RDCS Referring Phys:  [id number] Rex Kras Diagnosing Phys: Adrian Prows MD IMPRESSIONS  1. Left ventricular ejection fraction, by estimation, is 20 to 25%. Left ventricular ejection fraction by 2D MOD biplane is 32.3 %. The left ventricle has severely decreased function. The left ventricle demonstrates global hypokinesis. The left ventricular internal cavity size was mildly dilated. There is mild left ventricular hypertrophy. Left ventricular diastolic function could not be evaluated.  2. Right ventricular systolic function is normal. The right ventricular size is normal. Mildly increased right ventricular wall thickness. There is normal pulmonary artery systolic  pressure. The estimated right ventricular systolic pressure is 0000000 mmHg.  3. Left atrial size was mildly dilated.  4. Right atrial size was mildly dilated.  5. The mitral valve is normal in structure. Trivial mitral valve regurgitation. No evidence of mitral stenosis.  6. Tricuspid valve regurgitation is mild to moderate.  7. The aortic valve is normal in structure. Aortic valve regurgitation is not visualized. No aortic stenosis is present.  8. The inferior vena cava is normal in size with greater than 50% respiratory variability, suggesting right atrial pressure of 3 mmHg. Conclusion(s)/Recommendation(s): Findings consistent with dilated cardiomyopathy. FINDINGS  Left Ventricle: Left ventricular ejection fraction, by estimation, is 20 to 25%. Left ventricular ejection fraction by 2D MOD biplane is 32.3 %. The left ventricle has severely decreased function. The left ventricle demonstrates global hypokinesis. The left ventricular internal cavity size was mildly dilated. There is mild left ventricular hypertrophy. Left ventricular diastolic function  could not be evaluated. Right Ventricle: The right ventricular size is normal. Mildly increased right ventricular wall thickness. Right ventricular systolic function is normal. There is normal pulmonary artery systolic pressure. The tricuspid regurgitant velocity is 2.67 m/s, and with an assumed right atrial pressure of 3 mmHg, the estimated right ventricular systolic pressure is 0000000 mmHg. Left Atrium: Left atrial size was mildly dilated. Right Atrium: Right atrial size was mildly dilated. Pericardium: There is no evidence of pericardial effusion. Mitral Valve: The mitral valve is normal in structure. Trivial mitral valve regurgitation. No evidence of mitral valve stenosis. Tricuspid Valve: The tricuspid valve is normal in structure. Tricuspid valve regurgitation is mild to moderate. No evidence of tricuspid stenosis. Aortic Valve: The aortic valve is normal in  structure. Aortic valve regurgitation is not visualized. No aortic stenosis is present. Pulmonic Valve: The pulmonic valve was normal in structure. Pulmonic valve regurgitation is trivial. Aorta: The aortic root is normal in size and structure. Venous: The inferior vena cava is normal in size with greater than 50% respiratory variability, suggesting right atrial pressure of 3 mmHg. IAS/Shunts: No atrial level shunt detected by color flow Doppler.  LEFT VENTRICLE PLAX 2D                        Biplane EF (MOD) LVIDd:         5.75 cm         LV Biplane EF:   Left LVIDs:         4.70 cm                          ventricular LV PW:         1.30 cm                          ejection LV IVS:        0.90 cm                          fraction by LVOT diam:     2.00 cm                          2D MOD LV SV:         63                               biplane is LV SV Index:   35                               32.3 %. LVOT Area:     3.14 cm  LV Volumes (MOD) LV vol d, MOD    131.0 ml A2C: LV vol d, MOD    143.0 ml A4C: LV vol s, MOD    92.1 ml A2C: LV vol s, MOD    99.3 ml A4C: LV SV MOD A2C:   38.9 ml LV SV MOD A4C:   143.0 ml LV SV MOD BP:    46.0 ml RIGHT VENTRICLE TAPSE (M-mode): 1.3 cm LEFT ATRIUM             Index       RIGHT ATRIUM           Index  LA diam:        2.90 cm 1.58 cm/m  RA Area:     15.90 cm LA Vol (A2C):   87.9 ml 47.79 ml/m RA Volume:   43.90 ml  23.87 ml/m LA Vol (A4C):   50.1 ml 27.24 ml/m LA Biplane Vol: 67.8 ml 36.86 ml/m  AORTIC VALVE LVOT Vmax:   113.12 cm/s LVOT Vmean:  72.375 cm/s LVOT VTI:    0.202 m  AORTA Ao Root diam: 3.00 cm Ao Asc diam:  3.20 cm TRICUSPID VALVE TR Peak grad:   28.5 mmHg TR Vmax:        267.00 cm/s  SHUNTS Systemic VTI:  0.20 m Systemic Diam: 2.00 cm Adrian Prows MD Electronically signed by Adrian Prows MD Signature Date/Time: 02/26/2021/4:10:59 PM    Final      FOLLOW UP PLANS AND APPOINTMENTS  Allergies as of 02/28/2021   No Known Allergies      Medication List      STOP taking these medications    chlorthalidone 25 MG tablet Commonly known as: HYGROTON   lisinopril 40 MG tablet Commonly known as: ZESTRIL   metoprolol tartrate 25 MG tablet Commonly known as: LOPRESSOR       TAKE these medications    atorvastatin 40 MG tablet Commonly known as: LIPITOR TAKE 1 TABLET (40 MG TOTAL) BY MOUTH DAILY.   bumetanide 1 MG tablet Commonly known as: BUMEX Take 1 tablet (1 mg total) by mouth daily. Start taking on: March 01, 2021   carvedilol 6.25 MG tablet Commonly known as: COREG Take 1 tablet (6.25 mg total) by mouth 2 (two) times daily with a meal. Hold if systolic blood pressure (top number) less than 100 mmHg or pulse less than 60 bpm.   dapagliflozin propanediol 10 MG Tabs tablet Commonly known as: FARXIGA Take 1 tablet (10 mg total) by mouth daily. Start taking on: March 01, 2021   EQ Childrens Aspirin 81 MG chewable tablet Generic drug: aspirin CHEW AND SWALLOW ONE TABLET BY MOUTH ONCE DAILY   melatonin 3 MG Tabs tablet Take 1 tablet (3 mg total) by mouth at bedtime.   methocarbamol 500 MG tablet Commonly known as: ROBAXIN TAKE 1 TABLET BY MOUTH TWICE DAILY.   sacubitril-valsartan 49-51 MG Commonly known as: ENTRESTO Take 1 tablet by mouth 2 (two) times daily.   spironolactone 25 MG tablet Commonly known as: ALDACTONE Take 1 tablet (25 mg total) by mouth daily at 10 pm.        Follow-up Information     [facility] .   Contact information: [place] [phone] Blair, [place], DO. Schedule an appointment as soon as possible for a visit in 1 week(s).   Specialties: Cardiology, Vascular Surgery Why: Labs prior to the office visit. Please remind the patient to bring the caridac medication bottles at the next office visit to help facilitate a more accurate medication reconciliation. Contact information: [place] [address] [phone]                Total time discharging: 37 minutes  Mechele Claude [facility]  Pager: [phone] Office: [phone] 02/28/21 1:37 PM

## 2021-02-28 NOTE — Progress Notes (Signed)
Tele notified this RN of ST changes and QTC prolongation. EKG obtained. Cardiology paged to compare EKG's. No new orders placed. No complaints of chest pain.

## 2021-02-28 NOTE — Progress Notes (Signed)
Discharge instructions given. Patient verbalized understanding and all questions were answered.  ?

## 2021-03-01 ENCOUNTER — Other Ambulatory Visit (HOSPITAL_COMMUNITY): Payer: Self-pay

## 2021-03-01 MED ORDER — ATORVASTATIN CALCIUM 40 MG PO TABS
40.0000 mg | ORAL_TABLET | Freq: Every day | ORAL | 2 refills | Status: DC
  Filled 2021-03-01: qty 90, 90d supply, fill #0

## 2021-03-04 NOTE — Progress Notes (Signed)
Date:  03/05/2021   ID:  Sean Leblanc, DOB June 11, 1961, MRN [id number]  PCP:  Virl Axe, MD  Cardiologist:  Rex Kras, DO, [facility]  (established care 03/05/2021)  Date: 03/05/21 Last encounter: 02/28/2021   Chief Complaint  Patient presents with   Non-STEMI    Congestive Heart Failure    HPI  Sean Leblanc is a 60 y.o. male who presents to the office with a chief complaint of " recent hospital discharge due to non-STEMI and congestive heart failure." Patient's past medical history and cardiovascular risk factors include: non-STEMI (02/2021), nonischemic cardiomyopathy, acute HFrEF (02/2021), TIA (August 2017), history of cardiomyopathy, hypertension, hyperlipidemia, coronary artery atherosclerosis and aortic atherosclerosis (not gated CT 06/30/2020) cigarette smoking, alcohol use, cocaine use, marijuana use.   He is referred to the office at the request of Virl Axe, MD for evaluation of non-STEMI.  Patient presented to the hospital with a chief complaint of shortness of breath and dizziness.  His shortness of breath will get worse with effort related activities his blood pressures were significantly elevated.  High sensitive troponins were elevated, EKG noted intermittent left bundle branch block and T wave inversions in the inferior leads suggestive of ischemia.  He was taken to Cath Lab urgently to evaluate for CAD.  Patient was found to have mild nonobstructive CAD but severely reduced LVEF by echo and LV gram.  No coronary interventions were performed.  He was uptitrated on GDMT and later discharged home.  He now follows up for a TOC visit for the management of his non-STEMI and acute heart failure with reduced EF.  Since hospital discharge patient states that his shortness of breath is improving as well as his chest discomfort.  He has been compliant with his medical therapy.  Weight remains relatively stable.  However has been experiencing lightheaded and dizziness at  times.  Since hospital discharge patient has stopped smoking cigarettes, consuming cocaine and marijuana.  ALLERGIES: No Known Allergies  MEDICATION LIST PRIOR TO VISIT: Current Meds  Medication Sig   atorvastatin (LIPITOR) 40 MG tablet Take 1 tablet (40 mg total) by mouth daily.   carvedilol (COREG) 6.25 MG tablet Take 1 tablet (6.25 mg total) by mouth 2 (two) times daily with a meal. Hold if systolic blood pressure (top number) less than 100 mmHg or pulse less than 60 bpm.   dapagliflozin propanediol (FARXIGA) 10 MG TABS tablet Take 1 tablet (10 mg total) by mouth daily.   sacubitril-valsartan (ENTRESTO) 49-51 MG Take 1 tablet by mouth 2 (two) times daily.   [DISCONTINUED] bumetanide (BUMEX) 1 MG tablet Take 1 tablet (1 mg total) by mouth daily.     PAST MEDICAL HISTORY: Past Medical History:  Diagnosis Date   Atherosclerosis of aorta (HCC)    Cardiomyopathy (HCC)    CHF (congestive heart failure) ([place])    Coronary atherosclerosis due to calcified coronary lesion    Hyperlipidemia    Hypertension    TIA (transient ischemic attack)     PAST SURGICAL HISTORY: Past Surgical History:  Procedure Laterality Date   LEFT HEART CATH AND CORONARY ANGIOGRAPHY N/A 02/26/2021   Procedure: LEFT HEART CATH AND CORONARY ANGIOGRAPHY;  Surgeon: Adrian Prows, MD;  Location: [place] [facility];  Service: Cardiovascular;  Laterality: N/A;    FAMILY HISTORY: The patient family history includes Cancer in his father; Heart attack in his mother.  SOCIAL HISTORY:  The patient  reports that he has quit smoking. His smoking use included cigarettes. He has  a 4.00 pack-year smoking history. He has never used smokeless tobacco. He reports that he does not currently use alcohol. He reports that he does not currently use drugs after having used the following drugs: Marijuana and Codeine.  REVIEW OF SYSTEMS: Review of Systems  Constitutional: Negative for chills and fever.  HENT:  Negative for hoarse voice  and nosebleeds.   Eyes:  Negative for discharge, double vision and pain.  Cardiovascular:  Negative for chest pain, claudication, dyspnea on exertion, leg swelling, near-syncope, orthopnea, palpitations, paroxysmal nocturnal dyspnea and syncope.  Respiratory:  Negative for hemoptysis and shortness of breath.   Musculoskeletal:  Negative for muscle cramps and myalgias.  Gastrointestinal:  Negative for abdominal pain, constipation, diarrhea, hematemesis, hematochezia, melena, nausea and vomiting.  Neurological:  Positive for dizziness and light-headedness.   PHYSICAL EXAM: Vitals with BMI 03/05/2021 02/28/2021 02/28/2021  Height '5\' 11"'$  - -  Weight 131 lbs - 123 lbs 3 oz  BMI [id number] - [id number]  Systolic [id number]  Diastolic 68 77 86  Pulse 85 89 56    CONSTITUTIONAL: Well-developed and well-nourished. No acute distress.  SKIN: Skin is warm and dry. No rash noted. No cyanosis. No pallor. No jaundice HEAD: Normocephalic and atraumatic.  EYES: No scleral icterus MOUTH/THROAT: Moist oral membranes.  NECK: No JVD present. No thyromegaly noted. No carotid bruits  LYMPHATIC: No visible cervical adenopathy.  CHEST Normal respiratory effort. No intercostal retractions  LUNGS: Clear to auscultation bilaterally.  No stridor. No wheezes. No rales.  CARDIOVASCULAR: Regular, positive S1-S2, no murmurs rubs or gallops appreciated. ABDOMINAL: No apparent ascites.  EXTREMITIES: No peripheral edema, warm to touch, 2+ DP and PT pulses bilaterally. HEMATOLOGIC: No significant bruising NEUROLOGIC: Oriented to person, place, and time. Nonfocal. Normal muscle tone.  PSYCHIATRIC: Normal mood and affect. Normal behavior. Cooperative  CARDIAC DATABASE: EKG: 03/05/2021: Normal sinus rhythm, 67 beats per minute, LAE, LVH per voltage criteria, T wave inversions in the inferior lateral leads secondary to LVH but anterolateral and inferior ischemia cannot be ruled out.  Similar findings on prior  ECGs.  Echocardiogram:  02/2016 EF 35-40%, severe concentric hypertrophy, diffuse hypokinesis   05/2017: LVEF 40-45%, diffuse global hypokinesis, mild LVH, grade 2 diastolic impairment, elevated filling pressures, mild left atrial enlargement.   02/26/2021: LVEF 20-25%, severely reduced systolic function, global hypokinesis, left ventricular internal cavity mildly dilated, mild LVH, right ventricular size normal RVSP 31 mmHg, mildly dilated left atrium, mildly dilated right atrium, trivial MR, mild to moderate TR.  Stress Testing:  05/2017: The left ventricular ejection fraction is moderately decreased (30-44%). Nuclear stress EF: 39%. There was no ST segment deviation noted during stress. Patient developed a LBBB with the infusion of Lexiscan. This is an intermediate risk study due to reduced systolic function. No ischemia.   Heart Catheterization: LHC 02/26/2021:  LV: 182/0, EDP 13 mmHg.  Aortic pressure 170/87, mean 117 mmHg.  There was no pressure gradient across the aortic valve. LVEF: Global hypokinesis, size upper limit of normal, EF 20 to 25%. Coronary arteries: Very mild disease in the ramus intermediate constituting 30% otherwise minimal disease in the proximal and mid LAD of 10% and circumflex proximal and mid 10%.  Right coronary artery is normal. LM: Smooth and normal. LAD: Large vessel, gives origin to a moderate-sized D1 and several small diagonals, has minimal disease in the proximal segment. CX: Large vessel, has mild luminal irregularity in the proximal segment.   RI: Mild diffuse luminal irregularity.  Constitutes a proximal 20 to  30% stenosis. RCA: Dominant.  Normal.  LABORATORY DATA: CBC Latest Ref Rng & Units 02/26/2021 06/30/2020 03/03/2016  WBC 4.0 - 10.5 K/uL 6.5 7.0 -  Hemoglobin 13.0 - 17.0 g/dL 13.7 14.8 15.6  Hematocrit 39.0 - 52.0 % 41.6 47.5 46.0  Platelets 150 - 400 K/uL 304 289 -    CMP Latest Ref Rng & Units 02/28/2021 02/27/2021 02/26/2021  Glucose 70  - 99 mg/dL 94 109(H) 117(H)  BUN 6 - 20 mg/dL '18 11 11  '$ Creatinine 0.61 - 1.24 mg/dL 1.11 1.05 0.96  Sodium 135 - 145 mmol/L 133(L) 133(L) 134(L)  Potassium 3.5 - 5.1 mmol/L 3.7 3.6 3.7  Chloride 98 - 111 mmol/L 97(L) 99 102  CO2 22 - 32 mmol/L '25 24 22  '$ Calcium 8.9 - 10.3 mg/dL 9.1 9.0 9.0  Total Protein 6.5 - 8.1 g/dL - - -  Total Bilirubin 0.3 - 1.2 mg/dL - - -  Alkaline Phos 38 - 126 U/L - - -  AST 15 - 41 U/L - - -  ALT 17 - 63 U/L - - -    Lipid Panel  Lab Results  Component Value Date   CHOL 192 02/27/2021   HDL 49 02/27/2021   LDLCALC 129 (H) 02/27/2021   TRIG 68 02/27/2021   CHOLHDL 3.9 02/27/2021     No components found for: NTPROBNP No results for input(s): PROBNP in the last 8760 hours. Recent Labs    02/26/21 1151  TSH 1.394    BMP Recent Labs    02/26/21 0826 02/27/21 0620 02/28/21 0254  NA 134* 133* 133*  K 3.7 3.6 3.7  CL 102 99 97*  CO2 '22 24 25  '$ GLUCOSE 117* 109* 94  BUN '11 11 18  '$ CREATININE 0.96 1.05 1.11  CALCIUM 9.0 9.0 9.1  GFRNONAA >60 >60 >60    HEMOGLOBIN A1C Lab Results  Component Value Date   HGBA1C 5.0 02/26/2021   MPG 96.8 02/26/2021    IMPRESSION:    ICD-10-CM   1. Non-STEMI (non-ST elevated myocardial infarction) (HCC)  I21.4 EKG 12-Lead    2. Nonischemic cardiomyopathy (HCC)  99991111 Basic metabolic panel    Magnesium    Pro b natriuretic peptide (BNP)    3. Acute HFrEF (heart failure with reduced ejection fraction) (HCC)  [id number] Basic metabolic panel    Magnesium    Pro b natriuretic peptide (BNP)    bumetanide (BUMEX) 1 MG tablet    4. Mixed hyperlipidemia  E78.2 atorvastatin (LIPITOR) 40 MG tablet    5. Benign hypertension  I10     6. Atherosclerosis of aorta (HCC)  I70.0     7. History of transient ischemic attack (TIA)  Z86.73     8. Coronary atherosclerosis due to calcified coronary lesion  I25.10    I25.84        RECOMMENDATIONS: JAXSON WEISMAN is a 60 y.o. male whose past medical history and  cardiac risk factors include: non-STEMI (02/2021), nonischemic cardiomyopathy, acute HFrEF (02/2021), TIA (August 2017), history of cardiomyopathy, hypertension, hyperlipidemia, coronary artery atherosclerosis and aortic atherosclerosis (not gated CT 06/30/2020) cigarette smoking, alcohol use, cocaine use, marijuana use.   Acute HFrEF (heart failure with reduced ejection fraction) (HCC) Weight remains relatively stable. Shortness of breath improving and not in overt congestive heart failure. Continue Lisabeth Register, carvedilol. Patient assistance forms for Entresto and Wilder Glade filled and submitted during today's visit. Hold Bumex for now and to take it as needed -if weight increases by 1 pound over  24 hours for 3 pounds over a week. Blood work in 1 month prior to next office visit. We will focus on up titration of GDMT, add spironolactone at the next visit based on labs and symptoms  Nonischemic cardiomyopathy ([place]) Management as noted above.  Coronary atherosclerosis due to calcified coronary lesion Aspirin and statin therapy.  Non-STEMI (non-ST elevated myocardial infarction) ([place]) Most likely secondary to hypertensive crisis, vasospasms due to cocaine and marijuana use. Very mild nonobstructive CAD Educated on the importance of improving his modifiable cardiovascular risk factors. Continue guideline directed medical therapy.  Mixed hyperlipidemia Most recent lipid profile independently reviewed. Refill Lipitor 40 mg p.o. nightly  Benign hypertension Blood pressures are very well controlled; however, he feels lightheaded and dizziness. Will hold Bumex and use it as needed for now as discussed above Low-salt diet recommended.  Atherosclerosis of aorta (HCC) Continue aspirin and statin therapy  History of transient ischemic attack (TIA) Continue aspirin and statin therapy. Educated on the importance of secondary prevention.  Since the last encounter in the hospital patient  states that he has not been smoking cigarettes or consuming marijuana or cocaine.  He is congratulated for his efforts and we will review evaluate this at the next visit.  Patient also requested a work note for his job which was provided to him during today's encounter.  FINAL MEDICATION LIST END OF ENCOUNTER: Meds ordered this encounter  Medications   atorvastatin (LIPITOR) 40 MG tablet    Sig: Take 1 tablet (40 mg total) by mouth at bedtime.    Dispense:  90 tablet    Refill:  0   bumetanide (BUMEX) 1 MG tablet    Sig: Take 1 tablet (1 mg total) by mouth daily as needed (More than 1 pound over 24 hours or 3 pounds over 1 week.).    Dispense:  30 tablet    Refill:  0     Medications Discontinued During This Encounter  Medication Reason   spironolactone (ALDACTONE) 25 MG tablet Error   methocarbamol (ROBAXIN) 500 MG tablet Error   melatonin 3 MG TABS tablet Error   EQ CHILDRENS ASPIRIN 81 MG chewable tablet Error   bumetanide (BUMEX) 1 MG tablet      Current Outpatient Medications:    atorvastatin (LIPITOR) 40 MG tablet, Take 1 tablet (40 mg total) by mouth daily., Disp: 90 tablet, Rfl: 2   carvedilol (COREG) 6.25 MG tablet, Take 1 tablet (6.25 mg total) by mouth 2 (two) times daily with a meal. Hold if systolic blood pressure (top number) less than 100 mmHg or pulse less than 60 bpm., Disp: 60 tablet, Rfl: 0   dapagliflozin propanediol (FARXIGA) 10 MG TABS tablet, Take 1 tablet (10 mg total) by mouth daily., Disp: 30 tablet, Rfl: 0   sacubitril-valsartan (ENTRESTO) 49-51 MG, Take 1 tablet by mouth 2 (two) times daily., Disp: 60 tablet, Rfl: 0   atorvastatin (LIPITOR) 40 MG tablet, Take 1 tablet (40 mg total) by mouth at bedtime., Disp: 90 tablet, Rfl: 0   bumetanide (BUMEX) 1 MG tablet, Take 1 tablet (1 mg total) by mouth daily as needed (More than 1 pound over 24 hours or 3 pounds over 1 week.)., Disp: 30 tablet, Rfl: 0  Orders Placed This Encounter  Procedures   Basic metabolic  panel   Magnesium   Pro b natriuretic peptide (BNP)   EKG 12-Lead    There are no Patient Instructions on file for this visit.   --Continue cardiac medications as reconciled  in final medication list. --Return in about 4 weeks (around 04/02/2021) for Follow up, heart failure management, labs. Or sooner if needed. --Continue follow-up with your primary care physician regarding the management of your other chronic comorbid conditions.  Patient's questions and concerns were addressed to his satisfaction. He voices understanding of the instructions provided during this encounter.   This note was created using a voice recognition software as a result there may be grammatical errors inadvertently enclosed that do not reflect the nature of this encounter. Every attempt is made to correct such errors.  Rex Kras, [place], [facility]  Pager: [phone] Office: [phone]

## 2021-03-05 ENCOUNTER — Other Ambulatory Visit (HOSPITAL_COMMUNITY): Payer: Self-pay

## 2021-03-05 ENCOUNTER — Encounter: Payer: Self-pay | Admitting: Cardiology

## 2021-03-05 ENCOUNTER — Ambulatory Visit: Payer: BC Managed Care – PPO | Admitting: Cardiology

## 2021-03-05 ENCOUNTER — Other Ambulatory Visit: Payer: Self-pay

## 2021-03-05 VITALS — BP 107/68 | HR 85 | Temp 97.2°F | Resp 16 | Ht 71.0 in | Wt 131.0 lb

## 2021-03-05 DIAGNOSIS — I5021 Acute systolic (congestive) heart failure: Secondary | ICD-10-CM | POA: Diagnosis not present

## 2021-03-05 DIAGNOSIS — I10 Essential (primary) hypertension: Secondary | ICD-10-CM

## 2021-03-05 DIAGNOSIS — E782 Mixed hyperlipidemia: Secondary | ICD-10-CM

## 2021-03-05 DIAGNOSIS — I428 Other cardiomyopathies: Secondary | ICD-10-CM | POA: Diagnosis not present

## 2021-03-05 DIAGNOSIS — I214 Non-ST elevation (NSTEMI) myocardial infarction: Secondary | ICD-10-CM

## 2021-03-05 DIAGNOSIS — I2510 Atherosclerotic heart disease of native coronary artery without angina pectoris: Secondary | ICD-10-CM

## 2021-03-05 DIAGNOSIS — I700 Atherosclerosis of aorta: Secondary | ICD-10-CM

## 2021-03-05 DIAGNOSIS — Z8673 Personal history of transient ischemic attack (TIA), and cerebral infarction without residual deficits: Secondary | ICD-10-CM

## 2021-03-05 MED ORDER — ATORVASTATIN CALCIUM 40 MG PO TABS
40.0000 mg | ORAL_TABLET | Freq: Every day | ORAL | 0 refills | Status: DC
  Filled 2021-03-05: qty 90, 90d supply, fill #0

## 2021-03-05 MED ORDER — ASPIRIN EC 81 MG PO TBEC
81.0000 mg | DELAYED_RELEASE_TABLET | Freq: Every day | ORAL | 11 refills | Status: DC
Start: 2021-03-05 — End: 2023-06-13
  Filled 2021-03-05: qty 30, 30d supply, fill #0

## 2021-03-05 MED ORDER — BUMETANIDE 1 MG PO TABS: 1.0000 mg | ORAL_TABLET | Freq: Every day | ORAL | 0 refills | Status: DC | PRN

## 2021-03-08 ENCOUNTER — Telehealth: Payer: Self-pay

## 2021-03-08 DIAGNOSIS — I499 Cardiac arrhythmia, unspecified: Secondary | ICD-10-CM | POA: Diagnosis not present

## 2021-03-08 DIAGNOSIS — R42 Dizziness and giddiness: Secondary | ICD-10-CM | POA: Diagnosis not present

## 2021-03-08 DIAGNOSIS — R0789 Other chest pain: Secondary | ICD-10-CM | POA: Diagnosis not present

## 2021-03-08 DIAGNOSIS — R079 Chest pain, unspecified: Secondary | ICD-10-CM | POA: Diagnosis not present

## 2021-03-08 NOTE — Telephone Encounter (Signed)
I agree. Thank you.

## 2021-03-08 NOTE — Telephone Encounter (Signed)
Received TC from patient who states he was at work this morning and started feeling "dizzy, hot, sweating, and tightness in his chest".  He states his coworkers told him he did not look good, his b/p was taken and it was 32/50.  An ambulance was called and EMT's retook his b/p which was 103/70's and told him "to call his doctor" and left.  Pt is now lying down, still c/o "a little tightness in his chest which feels like a little pressure, dizziness, and sweating when standing".  Patient was informed to present to ED for evaluation as a cardiac event needs to be ruled out.  He verbalized understanding and was told to have someone drive him or to call 911. SChaplin, RN,BSN

## 2021-03-09 ENCOUNTER — Telehealth: Payer: Self-pay | Admitting: Cardiology

## 2021-03-09 ENCOUNTER — Telehealth: Payer: Self-pay

## 2021-03-09 NOTE — Telephone Encounter (Signed)
Have him cut his Entresto 49/'51mg'$  po bid in half and take it that way. And for today hold his remaining meds.  If he needs a note to return back to work please have him see CC either today or tomorrow. I can round with her.   Update MAR.

## 2021-03-09 NOTE — Telephone Encounter (Signed)
Make sure he is holding his Bumex as discussed at last office visit.  Low BP??? What are his numbers?

## 2021-03-09 NOTE — Telephone Encounter (Signed)
Would you like for patient to schedule an appointment to see you? Please advise.

## 2021-03-09 NOTE — Telephone Encounter (Signed)
Called patient, and no he did HOLD the Bumex. He took one the night before and every night since . He began to feel dizzy, so he had someone checked his BP.  The BP reading from his job was 75/59 LA and low 70s/50s RA, at that point they call 911 and when the EMT got there, they rechecked it and it was 100/70. Patient does not regularly check his BP.

## 2021-03-10 NOTE — Telephone Encounter (Signed)
Spoke with patient, he will try taking the half of the Entresto 49-'51mg'$  BID and will HOLD his remaining meds for today. I transferred call to front desk to schedule appointment.

## 2021-03-11 ENCOUNTER — Other Ambulatory Visit: Payer: Self-pay

## 2021-03-11 ENCOUNTER — Other Ambulatory Visit (HOSPITAL_COMMUNITY): Payer: Self-pay

## 2021-03-11 ENCOUNTER — Encounter: Payer: Self-pay | Admitting: Student

## 2021-03-11 ENCOUNTER — Ambulatory Visit: Payer: BC Managed Care – PPO | Admitting: Student

## 2021-03-11 VITALS — BP 128/87 | HR 76 | Temp 97.7°F | Ht 71.0 in | Wt 135.8 lb

## 2021-03-11 DIAGNOSIS — I2510 Atherosclerotic heart disease of native coronary artery without angina pectoris: Secondary | ICD-10-CM | POA: Diagnosis not present

## 2021-03-11 DIAGNOSIS — R42 Dizziness and giddiness: Secondary | ICD-10-CM

## 2021-03-11 DIAGNOSIS — I10 Essential (primary) hypertension: Secondary | ICD-10-CM | POA: Diagnosis not present

## 2021-03-11 DIAGNOSIS — I428 Other cardiomyopathies: Secondary | ICD-10-CM

## 2021-03-11 DIAGNOSIS — I5021 Acute systolic (congestive) heart failure: Secondary | ICD-10-CM

## 2021-03-11 DIAGNOSIS — I2584 Coronary atherosclerosis due to calcified coronary lesion: Secondary | ICD-10-CM

## 2021-03-11 MED ORDER — ENTRESTO 24-26 MG PO TABS
1.0000 | ORAL_TABLET | Freq: Two times a day (BID) | ORAL | 3 refills | Status: DC
  Filled 2021-03-11: qty 60, 30d supply, fill #0

## 2021-03-11 MED ORDER — ATORVASTATIN CALCIUM 40 MG PO TABS
40.0000 mg | ORAL_TABLET | Freq: Every day | ORAL | 3 refills | Status: DC
  Filled 2021-03-11 (×2): qty 90, 90d supply, fill #0

## 2021-03-11 NOTE — Progress Notes (Signed)
Date:  03/11/2021   ID:  Sean Leblanc, DOB [birth year], MRN [id number]  PCP:  Virl Axe, MD  Cardiologist:  Rex Kras, DO, [facility]  (established care 03/05/2021)  Date: 03/11/21 Last encounter: 02/28/2021   Chief Complaint  Patient presents with   Acute HFrEF     HPI  Sean Leblanc is a 60 y.o. male who presents to the office with a chief complaint of " recent hospital discharge due to non-STEMI and congestive heart failure." Patient's past medical history and cardiovascular risk factors include: non-STEMI (02/2021), nonischemic cardiomyopathy, acute HFrEF (02/2021), TIA (August 2017), history of cardiomyopathy, hypertension, hyperlipidemia, coronary artery atherosclerosis and aortic atherosclerosis (not gated CT 06/30/2020) cigarette smoking, alcohol use, cocaine use, marijuana use.   He is referred to the office at the request of Virl Axe, MD for evaluation of non-STEMI.  Patient admitted 02/26/2021 - 02/28/2021 with chief complaint of shortness of breath and dizziness, evaluation revealed mild nonobstructive CAD but severely reduced LVEF.  Patient was uptitrated on guideline directed medical therapy and discharged.  Patient followed up in the office with Dr. Terri Skains 03/05/2021 at which time he was advised to take Bumex as needed rather than daily.   Patient now presents for urgent visit following episode of lightheadedness that occurred at work yesterday.  Patient states he was working on [employer] when he began to feel lightheaded and had to sit down.  Work Probation officer checked his blood pressure which patient reports was 79/55 mmHg.  EMS was therefore called, at the time of their arrival patient's blood pressure was 100/70 mmHg with heart rate 64 bpm.  Patient called our office and was advised to hold carvedilol and Entresto yesterday and resume Entresto 49/51 mg half tablet daily as well as carvedilol.  Patient is feeling well today without recurrence of lightheadedness since  yesterday's episode at work.  Since hospital discharge patient has stopped smoking cigarettes, consuming cocaine and marijuana.  ALLERGIES: No Known Allergies  MEDICATION LIST PRIOR TO VISIT: Current Meds  Medication Sig   sacubitril-valsartan (ENTRESTO) 24-26 MG Take 1 tablet by mouth 2 (two) times daily.   [DISCONTINUED] sacubitril-valsartan (ENTRESTO) 49-51 MG Take 1 tablet by mouth 2 (two) times daily. (Patient taking differently: Take 0.5 tablets by mouth 2 (two) times daily.)     PAST MEDICAL HISTORY: Past Medical History:  Diagnosis Date   Atherosclerosis of aorta (HCC)    Cardiomyopathy ([place])    CHF (congestive heart failure) ([place])    Coronary atherosclerosis due to calcified coronary lesion    Hyperlipidemia    Hypertension    TIA (transient ischemic attack)     PAST SURGICAL HISTORY: Past Surgical History:  Procedure Laterality Date   LEFT HEART CATH AND CORONARY ANGIOGRAPHY N/A 02/26/2021   Procedure: LEFT HEART CATH AND CORONARY ANGIOGRAPHY;  Surgeon: Adrian Prows, MD;  Location: [place] [facility];  Service: Cardiovascular;  Laterality: N/A;    FAMILY HISTORY: The patient family history includes Cancer in his father; Heart attack in his mother.  SOCIAL HISTORY:  The patient  reports that he has quit smoking. His smoking use included cigarettes. He has a 4.00 pack-year smoking history. He has never used smokeless tobacco. He reports that he does not currently use alcohol. He reports that he does not currently use drugs after having used the following drugs: Marijuana and Codeine.  REVIEW OF SYSTEMS: Review of Systems  Constitutional: Negative for chills, fever and weight gain.  HENT:  Negative for hoarse voice and  nosebleeds.   Eyes:  Negative for discharge, double vision and pain.  Cardiovascular:  Negative for chest pain, claudication, dyspnea on exertion, leg swelling, near-syncope, orthopnea, palpitations, paroxysmal nocturnal dyspnea and syncope.   Respiratory:  Negative for hemoptysis and shortness of breath.   Musculoskeletal:  Negative for muscle cramps and myalgias.  Gastrointestinal:  Negative for abdominal pain, constipation, diarrhea, hematemesis, hematochezia, melena, nausea and vomiting.  Neurological:  Positive for dizziness and light-headedness.   PHYSICAL EXAM: Vitals with BMI 03/11/2021 03/05/2021 02/28/2021  Height '5\' 11"'$  '5\' 11"'$  -  Weight 135 lbs 13 oz 131 lbs -  BMI [id number] -  Systolic [id number]  Diastolic 87 68 77  Pulse 76 85 89    CONSTITUTIONAL: Well-developed and well-nourished. No acute distress.  SKIN: Skin is warm and dry. No rash noted. No cyanosis. No pallor. No jaundice HEAD: Normocephalic and atraumatic.  EYES: No scleral icterus MOUTH/THROAT: Moist oral membranes.  NECK: No JVD present. No thyromegaly noted. No carotid bruits  LYMPHATIC: No visible cervical adenopathy.  CHEST Normal respiratory effort. No intercostal retractions  LUNGS: Clear to auscultation bilaterally.  No stridor. No wheezes. No rales.  CARDIOVASCULAR: Regular, positive S1-S2, no murmurs rubs or gallops appreciated. ABDOMINAL: No apparent ascites.  EXTREMITIES: No peripheral edema, warm to touch, 2+ DP and PT pulses bilaterally. HEMATOLOGIC: No significant bruising NEUROLOGIC: Oriented to person, place, and time. Nonfocal. Normal muscle tone.  PSYCHIATRIC: Normal mood and affect. Normal behavior. Cooperative  Physical exam stable compared to previous.   CARDIAC DATABASE: EKG: 03/05/2021: Normal sinus rhythm, 67 beats per minute, LAE, LVH per voltage criteria, T wave inversions in the inferior lateral leads secondary to LVH but anterolateral and inferior ischemia cannot be ruled out.  Similar findings on prior ECGs.  Echocardiogram:  02/2016 EF 35-40%, severe concentric hypertrophy, diffuse hypokinesis   05/2017: LVEF 40-45%, diffuse global hypokinesis, mild LVH, grade 2 diastolic impairment, elevated filling pressures,  mild left atrial enlargement.   02/26/2021: LVEF 20-25%, severely reduced systolic function, global hypokinesis, left ventricular internal cavity mildly dilated, mild LVH, right ventricular size normal RVSP 31 mmHg, mildly dilated left atrium, mildly dilated right atrium, trivial MR, mild to moderate TR.  Stress Testing:  05/2017: The left ventricular ejection fraction is moderately decreased (30-44%). Nuclear stress EF: 39%. There was no ST segment deviation noted during stress. Patient developed a LBBB with the infusion of Lexiscan. This is an intermediate risk study due to reduced systolic function. No ischemia.   Heart Catheterization: LHC 02/26/2021:  LV: 182/0, EDP 13 mmHg.  Aortic pressure 170/87, mean 117 mmHg.  There was no pressure gradient across the aortic valve. LVEF: Global hypokinesis, size upper limit of normal, EF 20 to 25%. Coronary arteries: Very mild disease in the ramus intermediate constituting 30% otherwise minimal disease in the proximal and mid LAD of 10% and circumflex proximal and mid 10%.  Right coronary artery is normal. LM: Smooth and normal. LAD: Large vessel, gives origin to a moderate-sized D1 and several small diagonals, has minimal disease in the proximal segment. CX: Large vessel, has mild luminal irregularity in the proximal segment.   RI: Mild diffuse luminal irregularity.  Constitutes a proximal 20 to 30% stenosis. RCA: Dominant.  Normal.  LABORATORY DATA: CBC Latest Ref Rng & Units 02/26/2021 06/30/2020 03/03/2016  WBC 4.0 - 10.5 K/uL 6.5 7.0 -  Hemoglobin 13.0 - 17.0 g/dL 13.7 14.8 15.6  Hematocrit 39.0 - 52.0 % 41.6 47.5 46.0  Platelets 150 - 400 K/uL  304 289 -    CMP Latest Ref Rng & Units 02/28/2021 02/27/2021 02/26/2021  Glucose 70 - 99 mg/dL 94 109(H) 117(H)  BUN 6 - 20 mg/dL '18 11 11  '$ Creatinine 0.61 - 1.24 mg/dL 1.11 1.05 0.96  Sodium 135 - 145 mmol/L 133(L) 133(L) 134(L)  Potassium 3.5 - 5.1 mmol/L 3.7 3.6 3.7  Chloride 98 - 111 mmol/L  97(L) 99 102  CO2 22 - 32 mmol/L '25 24 22  '$ Calcium 8.9 - 10.3 mg/dL 9.1 9.0 9.0  Total Protein 6.5 - 8.1 g/dL - - -  Total Bilirubin 0.3 - 1.2 mg/dL - - -  Alkaline Phos 38 - 126 U/L - - -  AST 15 - 41 U/L - - -  ALT 17 - 63 U/L - - -    Lipid Panel  Lab Results  Component Value Date   CHOL 192 02/27/2021   HDL 49 02/27/2021   LDLCALC 129 (H) 02/27/2021   TRIG 68 02/27/2021   CHOLHDL 3.9 02/27/2021     No components found for: NTPROBNP No results for input(s): PROBNP in the last 8760 hours. Recent Labs    02/26/21 1151  TSH 1.394    BMP Recent Labs    02/26/21 0826 02/27/21 0620 02/28/21 0254  NA 134* 133* 133*  K 3.7 3.6 3.7  CL 102 99 97*  CO2 '22 24 25  '$ GLUCOSE 117* 109* 94  BUN '11 11 18  '$ CREATININE 0.96 1.05 1.11  CALCIUM 9.0 9.0 9.1  GFRNONAA >60 >60 >60    HEMOGLOBIN A1C Lab Results  Component Value Date   HGBA1C 5.0 02/26/2021   MPG 96.8 02/26/2021    IMPRESSION:    ICD-10-CM   1. Lightheadedness  R42     2. Nonischemic cardiomyopathy (HCC)  I42.8     3. Coronary atherosclerosis due to calcified coronary lesion  I25.10    I25.84     4. Benign hypertension  I10        RECOMMENDATIONS: QUATAVIOUS GROEBER is a 60 y.o. male whose past medical history and cardiac risk factors include: non-STEMI (02/2021), nonischemic cardiomyopathy, acute HFrEF (02/2021), TIA (August 2017), history of cardiomyopathy, hypertension, hyperlipidemia, coronary artery atherosclerosis and aortic atherosclerosis (not gated CT 06/30/2020) cigarette smoking, alcohol use, cocaine use, marijuana use.   Acute HFrEF (heart failure with reduced ejection fraction) (HCC) Dyspnea continues to improve.  Patient developed hypotension, will therefore reduce Entresto from 49/51 mg to 24/26 mg twice daily Continue carvedilol and Farxiga. Continue Bumex as needed.  Will continue to monitor closely, patient is advised to notify our office immediately if he has recurrence of  lightheadedness or dizziness.  Will consider uptitraton of GDMT as able in the future, although given hypotension addition of spironolactone may be limited.   Nonischemic cardiomyopathy ([place]) Management as above  Coronary atherosclerosis due to calcified coronary lesion Continue Asprin  Continue atorvastatin, will refill has patient has been without it for reportedly about 3 weeks.  Mixed hyperlipidemia Refilled atorvastatin   Benign hypertension Blood pressure has now improved from hypotensive episode yesterday. Well controlled presently.  Continue low-salt diet.   Patient is congratulated for his efforts in refraining from smoking cigarettes or consuming marijuana or cocaine.    FINAL MEDICATION LIST END OF ENCOUNTER: Meds ordered this encounter  Medications   atorvastatin (LIPITOR) 40 MG tablet    Sig: Take 1 tablet (40 mg total) by mouth daily.    Dispense:  90 tablet    Refill:  3  sacubitril-valsartan (ENTRESTO) 24-26 MG    Sig: Take 1 tablet by mouth 2 (two) times daily.    Dispense:  60 tablet    Refill:  3     Medications Discontinued During This Encounter  Medication Reason   atorvastatin (LIPITOR) 40 MG tablet Duplicate   sacubitril-valsartan (ENTRESTO) 49-51 MG    atorvastatin (LIPITOR) 40 MG tablet Reorder     Current Outpatient Medications:    sacubitril-valsartan (ENTRESTO) 24-26 MG, Take 1 tablet by mouth 2 (two) times daily., Disp: 60 tablet, Rfl: 3   aspirin EC 81 MG tablet, Take 1 tablet (81 mg total) by mouth daily. Swallow whole. (Patient not taking: Reported on 03/11/2021), Disp: 30 tablet, Rfl: 11   atorvastatin (LIPITOR) 40 MG tablet, Take 1 tablet (40 mg total) by mouth daily., Disp: 90 tablet, Rfl: 3   bumetanide (BUMEX) 1 MG tablet, Take 1 tablet (1 mg total) by mouth daily as needed (More than 1 pound over 24 hours or 3 pounds over 1 week.). (Patient not taking: Reported on 03/11/2021), Disp: 30 tablet, Rfl: 0   carvedilol (COREG) 6.25 MG tablet,  Take 1 tablet (6.25 mg total) by mouth 2 (two) times daily with a meal. Hold if systolic blood pressure (top number) less than 100 mmHg or pulse less than 60 bpm. (Patient not taking: Reported on 03/11/2021), Disp: 60 tablet, Rfl: 0   dapagliflozin propanediol (FARXIGA) 10 MG TABS tablet, Take 1 tablet (10 mg total) by mouth daily. (Patient not taking: Reported on 03/11/2021), Disp: 30 tablet, Rfl: 0  No orders of the defined types were placed in this encounter.   There are no Patient Instructions on file for this visit.   --Continue cardiac medications as reconciled in final medication list. --Return in about 4 weeks (around 04/08/2021) for HF, hypotension . Or sooner if needed. --Continue follow-up with your primary care physician regarding the management of your other chronic comorbid conditions.  Patient's questions and concerns were addressed to his satisfaction. He voices understanding of the instructions provided during this encounter.   This note was created using a voice recognition software as a result there may be grammatical errors inadvertently enclosed that do not reflect the nature of this encounter. Every attempt is made to correct such errors.   Alethia Berthold, PA-C 03/11/2021, 10:53 AM Office: [phone]

## 2021-03-17 ENCOUNTER — Ambulatory Visit: Payer: BC Managed Care – PPO | Admitting: Student

## 2021-03-17 VITALS — BP 122/83 | HR 72 | Temp 98.0°F | Wt 140.4 lb

## 2021-03-17 DIAGNOSIS — I10 Essential (primary) hypertension: Secondary | ICD-10-CM | POA: Diagnosis not present

## 2021-03-17 DIAGNOSIS — Z23 Encounter for immunization: Secondary | ICD-10-CM | POA: Diagnosis not present

## 2021-03-17 DIAGNOSIS — I110 Hypertensive heart disease with heart failure: Secondary | ICD-10-CM | POA: Diagnosis not present

## 2021-03-17 DIAGNOSIS — Z0000 Encounter for general adult medical examination without abnormal findings: Secondary | ICD-10-CM

## 2021-03-17 DIAGNOSIS — Z720 Tobacco use: Secondary | ICD-10-CM

## 2021-03-17 DIAGNOSIS — F1410 Cocaine abuse, uncomplicated: Secondary | ICD-10-CM | POA: Diagnosis not present

## 2021-03-17 DIAGNOSIS — I5021 Acute systolic (congestive) heart failure: Secondary | ICD-10-CM

## 2021-03-17 NOTE — Assessment & Plan Note (Signed)
Received flu shot today.  Advised Sean Leblanc to receive second COVID booster shot at any preferred pharmacy.

## 2021-03-17 NOTE — Assessment & Plan Note (Signed)
History of tobacco use, but reports having quit for the past two weeks after recent hospitalization. He states that his cravings are well controlled at this time and would not like any medications to assist with remaining tobacco use free. Congratulated Sean Leblanc on tobacco cessation and reemphasized benefits of this.

## 2021-03-17 NOTE — Patient Instructions (Signed)
Sean Leblanc,  It was a pleasure seeing you in the clinic today.   As we discussed,  Please continue abstaining for substance use as you have been for the past two weeks. You received your flu shot in the clinic today. Please obtain your second COVID booster shot at any pharmacy. Your next appointment will be in 3 months for a follow up visit.  Please call our clinic at [phone] if you have any questions or concerns. The best time to call is Monday-Friday from 9am-4pm, but there is someone available 24/7 at the same number. If you need medication refills, please notify your pharmacy one week in advance and they will send [place] a request.   Thank you for letting us take part in your care. We look forward to seeing you next time!

## 2021-03-17 NOTE — Progress Notes (Signed)
   CC: f/u for HTN and need for influenza vaccine  HPI:  [name] is a 60 y.o. male with history listed below presenting to the [facility] for f/u of HTN. Please see individualized problem based charting for full HPI.  Past Medical History:  Diagnosis Date   Atherosclerosis of aorta (HCC)    Cardiomyopathy (HCC)    CHF (congestive heart failure) (HCC)    Coronary atherosclerosis due to calcified coronary lesion    Hyperlipidemia    Hypertension    TIA (transient ischemic attack)     Review of Systems:  Negative aside from that listed in individualized problem based charting.  Physical Exam:  Vitals:   03/17/21 1311  BP: 122/83  Pulse: 72  Temp: 98 F (36.7 C)  TempSrc: Oral  SpO2: 99%  Weight: 140 lb 6.4 oz (63.7 kg)   Physical Exam Constitutional:      General: He is not in acute distress.    Appearance: He is not ill-appearing.  HENT:     Mouth/Throat:     Mouth: Mucous membranes are moist.     Pharynx: Oropharynx is clear. No oropharyngeal exudate.  Eyes:     Extraocular Movements: Extraocular movements intact.     Conjunctiva/sclera: Conjunctivae normal.     Pupils: Pupils are equal, round, and reactive to light.  Cardiovascular:     Rate and Rhythm: Normal rate and regular rhythm.     Pulses: Normal pulses.     Heart sounds: Normal heart sounds. No murmur heard.   No friction rub. No gallop.     Comments: No JVD noted Pulmonary:     Effort: Pulmonary effort is normal. No respiratory distress.     Breath sounds: Normal breath sounds. No wheezing or rales.  Abdominal:     General: Bowel sounds are normal. There is no distension.     Palpations: Abdomen is soft.     Tenderness: There is no abdominal tenderness.  Musculoskeletal:        General: No swelling. Normal range of motion.  Skin:    General: Skin is warm and dry.  Neurological:     General: No focal deficit present.     Mental Status: He is alert and oriented to person, place, and time.   Psychiatric:        Mood and Affect: Mood normal.        Behavior: Behavior normal.     Assessment & Plan:   See Encounters Tab for problem based charting.  Patient discussed with Dr.  Jimmye Norman

## 2021-03-17 NOTE — Assessment & Plan Note (Signed)
History of cocaine use disorder, but patient reports staying clean for the past 2 weeks after recent hospitalization and especially given concomitant heart failure. States that he was counseled by Cardiology at length about damaged of cocaine use to his heart and has no intention to restart cocaine use at this time. Commended patient about cessation and encouraged continued abstinence.

## 2021-03-17 NOTE — Assessment & Plan Note (Signed)
Recent ECHO and catheterization on 8/19 showing LVEF 20-25%. He is on GDMT with entresto, coreg, lipitor, farxiga, bumex. Doing well today with no leg swelling or JVD noted and lung exam unremarkable as well. He is due to follow up with [facility] on 9/29.   Plan: -continue current GDMT

## 2021-03-17 NOTE — Assessment & Plan Note (Signed)
Today's Vitals   03/17/21 1311  BP: 122/83  Pulse: 72  Temp: 98 F (36.7 C)  TempSrc: Oral  SpO2: 99%  Weight: 140 lb 6.4 oz (63.7 kg)   Body mass index is 19.58 kg/m.  History of HTN on coreg 6.'25mg'$  BID and Entresto 24-'26mg'$  BID. About one week ago, became hypotensive and went to see [facility] at which point his dose of entresto was reduced to that listed above. BP well controlled today.

## 2021-03-26 DIAGNOSIS — I428 Other cardiomyopathies: Secondary | ICD-10-CM | POA: Diagnosis not present

## 2021-03-26 DIAGNOSIS — I5021 Acute systolic (congestive) heart failure: Secondary | ICD-10-CM | POA: Diagnosis not present

## 2021-03-27 ENCOUNTER — Other Ambulatory Visit: Payer: Self-pay | Admitting: Cardiology

## 2021-03-27 DIAGNOSIS — I5021 Acute systolic (congestive) heart failure: Secondary | ICD-10-CM

## 2021-03-27 LAB — BASIC METABOLIC PANEL
BUN/Creatinine Ratio: 14 (ref 10–24)
BUN: 12 mg/dL (ref 8–27)
CO2: 20 mmol/L (ref 20–29)
Calcium: 9.5 mg/dL (ref 8.6–10.2)
Chloride: 104 mmol/L (ref 96–106)
Creatinine, Ser: 0.86 mg/dL (ref 0.76–1.27)
Glucose: 83 mg/dL (ref 65–99)
Potassium: 4 mmol/L (ref 3.5–5.2)
Sodium: 141 mmol/L (ref 134–144)
eGFR: 99 mL/min/{1.73_m2} (ref >59)

## 2021-03-27 LAB — MAGNESIUM: Magnesium: 1.9 mg/dL (ref 1.6–2.3)

## 2021-03-27 LAB — PRO B NATRIURETIC PEPTIDE: NT-Pro BNP: 597 pg/mL — ABNORMAL HIGH (ref 0–210)

## 2021-03-29 NOTE — Progress Notes (Signed)
Internal Medicine Clinic Attending  Case discussed with Dr. Jinwala  At the time of the visit.  We reviewed the resident's history and exam and pertinent patient test results.  I agree with the assessment, diagnosis, and plan of care documented in the resident's note.  

## 2021-04-05 NOTE — Progress Notes (Signed)
Pt aware.

## 2021-04-05 NOTE — Progress Notes (Signed)
Called patient, NA, LMAM

## 2021-04-13 ENCOUNTER — Ambulatory Visit: Payer: BC Managed Care – PPO | Admitting: Student

## 2021-04-13 ENCOUNTER — Other Ambulatory Visit: Payer: Self-pay

## 2021-04-13 ENCOUNTER — Ambulatory Visit: Payer: BC Managed Care – PPO | Admitting: Cardiology

## 2021-04-13 ENCOUNTER — Encounter: Payer: Self-pay | Admitting: Student

## 2021-04-13 VITALS — BP 146/90 | HR 70 | Temp 98.0°F | Ht 71.0 in | Wt 145.6 lb

## 2021-04-13 DIAGNOSIS — R42 Dizziness and giddiness: Secondary | ICD-10-CM | POA: Diagnosis not present

## 2021-04-13 DIAGNOSIS — I428 Other cardiomyopathies: Secondary | ICD-10-CM

## 2021-04-13 DIAGNOSIS — I5020 Unspecified systolic (congestive) heart failure: Secondary | ICD-10-CM

## 2021-04-13 MED ORDER — SPIRONOLACTONE 25 MG PO TABS: 12.5000 mg | ORAL_TABLET | Freq: Every day | ORAL | 3 refills | Status: DC

## 2021-04-13 NOTE — Progress Notes (Signed)
Date:  04/13/2021   ID:  [name], DOB 25-Feb-1961, MRN [id number]  PCP:  Virl Axe, MD  Cardiologist:  Rex Kras, DO, [facility]  (established care 03/05/2021)  Date: 04/13/21 Last encounter: 02/28/2021   Chief Complaint  Patient presents with   Heart Failure   Follow-up    HPI  Sean Leblanc is a 60 y.o. male who presents to the office with a chief complaint of " recent hospital discharge due to non-STEMI and congestive heart failure." Patient's past medical history and cardiovascular risk factors include: non-STEMI (02/2021), nonischemic cardiomyopathy, acute HFrEF (02/2021), TIA (August 2017), history of cardiomyopathy, hypertension, hyperlipidemia, coronary artery atherosclerosis and aortic atherosclerosis (not gated CT 06/30/2020) cigarette smoking, alcohol use, cocaine use, marijuana use.   He is referred to the office at the request of Virl Axe, MD for evaluation of non-STEMI.  Patient admitted 02/26/2021 - 02/28/2021 with chief complaint of shortness of breath and dizziness, evaluation revealed mild nonobstructive CAD but severely reduced LVEF.  Patient was uptitrated on guideline directed medical therapy and discharged.  Patient followed up in the office with Dr. Terri Skains 03/05/2021 at which time he was advised to take Bumex as needed rather than daily.   Patient was seen for an urgent visit 03/11/2021 with concerns of an episode of lightheadedness.  Last office visit reduced Entresto from 49/51 mg to 24/26 mg twice daily given hypotension.  He now presents for 4-week follow-up.  Patient has had no recurrence of lightheadedness or dizziness.  He is unable to check his blood pressure at home as he has no monitor.  He is presently taking Bumex approximately once per week.  He remains abstinent from cigarettes and cocaine as well as marijuana.  ALLERGIES: No Known Allergies  MEDICATION LIST PRIOR TO VISIT: Current Meds  Medication Sig   aspirin EC 81 MG tablet Take 1  tablet (81 mg total) by mouth daily. Swallow whole.   atorvastatin (LIPITOR) 40 MG tablet Take 1 tablet (40 mg total) by mouth daily.   bumetanide (BUMEX) 1 MG tablet TAKE 1 TABLET(1 MG) BY MOUTH DAILY   carvedilol (COREG) 6.25 MG tablet TAKE 1 TABLET BY MOUTH TWICE A DAY WITH A MEAL HOLD IF SYSTOLIC BLOOD PRESSURE LESS THAN 100MG HG OR PULSE LESS THAN 60 BPM   FARXIGA 10 MG TABS tablet TAKE 1 TABLET(10 MG) BY MOUTH DAILY   sacubitril-valsartan (ENTRESTO) 24-26 MG Take 1 tablet by mouth 2 (two) times daily.   spironolactone (ALDACTONE) 25 MG tablet Take 0.5 tablets (12.5 mg total) by mouth daily.     PAST MEDICAL HISTORY: Past Medical History:  Diagnosis Date   Atherosclerosis of aorta (HCC)    Cardiomyopathy (HCC)    CHF (congestive heart failure) ([place])    Coronary atherosclerosis due to calcified coronary lesion    Hyperlipidemia    Hypertension    TIA (transient ischemic attack)     PAST SURGICAL HISTORY: Past Surgical History:  Procedure Laterality Date   LEFT HEART CATH AND CORONARY ANGIOGRAPHY N/A 02/26/2021   Procedure: LEFT HEART CATH AND CORONARY ANGIOGRAPHY;  Surgeon: Adrian Prows, MD;  Location: [facility];  Service: Cardiovascular;  Laterality: N/A;    FAMILY HISTORY: The patient family history includes Cancer in his father; Heart attack in his mother.  SOCIAL HISTORY:  The patient  reports that he has quit smoking. His smoking use included cigarettes. He has a 4.00 pack-year smoking history. He has never used smokeless tobacco. He reports that he does  not currently use alcohol. He reports that he does not currently use drugs after having used the following drugs: Marijuana and Codeine.  REVIEW OF SYSTEMS: Review of Systems  Constitutional: Negative for chills, fever and weight gain.  HENT:  Negative for hoarse voice and nosebleeds.   Eyes:  Negative for discharge, double vision and pain.  Cardiovascular:  Negative for chest pain, claudication, dyspnea on  exertion, leg swelling, near-syncope, orthopnea, palpitations, paroxysmal nocturnal dyspnea and syncope.  Respiratory:  Negative for hemoptysis and shortness of breath.   Musculoskeletal:  Negative for muscle cramps and myalgias.  Gastrointestinal:  Negative for abdominal pain, constipation, diarrhea, hematemesis, hematochezia, melena, nausea and vomiting.  Neurological:  Negative for dizziness (no recurrence) and light-headedness (no recurrence).   PHYSICAL EXAM: Vitals with BMI 04/13/2021 04/13/2021 03/17/2021  Height - 5\' 11"  -  Weight - 145 lbs 10 oz 140 lbs 6 oz  BMI - 93.71 69.67  Systolic 893 810 175  Diastolic 90 99 83  Pulse 70 66 72    CONSTITUTIONAL: Well-developed and well-nourished. No acute distress.  SKIN: Skin is warm and dry. No rash noted. No cyanosis. No pallor. No jaundice HEAD: Normocephalic and atraumatic.  EYES: No scleral icterus MOUTH/THROAT: Moist oral membranes.  NECK: No JVD present. No thyromegaly noted. No carotid bruits  LYMPHATIC: No visible cervical adenopathy.  CHEST Normal respiratory effort. No intercostal retractions  LUNGS: Clear to auscultation bilaterally.  No stridor. No wheezes. No rales.  CARDIOVASCULAR: Regular, positive S1-S2, no murmurs rubs or gallops appreciated. ABDOMINAL: No apparent ascites.  EXTREMITIES: No peripheral edema, warm to touch, 2+ DP and PT pulses bilaterally. HEMATOLOGIC: No significant bruising NEUROLOGIC: Oriented to person, place, and time. Nonfocal. Normal muscle tone.  PSYCHIATRIC: Normal mood and affect. Normal behavior. Cooperative  Physical exam stable compared to previous.   CARDIAC DATABASE: EKG: 03/05/2021: Normal sinus rhythm, 67 beats per minute, LAE, LVH per voltage criteria, T wave inversions in the inferior lateral leads secondary to LVH but anterolateral and inferior ischemia cannot be ruled out.  Similar findings on prior ECGs.  Echocardiogram:  02/2016 EF 35-40%, severe concentric hypertrophy, diffuse  hypokinesis   05/2017: LVEF 40-45%, diffuse global hypokinesis, mild LVH, grade 2 diastolic impairment, elevated filling pressures, mild left atrial enlargement.   02/26/2021: LVEF 20-25%, severely reduced systolic function, global hypokinesis, left ventricular internal cavity mildly dilated, mild LVH, right ventricular size normal RVSP 31 mmHg, mildly dilated left atrium, mildly dilated right atrium, trivial MR, mild to moderate TR.  Stress Testing:  05/2017: The left ventricular ejection fraction is moderately decreased (30-44%). Nuclear stress EF: 39%. There was no ST segment deviation noted during stress. Patient developed a LBBB with the infusion of Lexiscan. This is an intermediate risk study due to reduced systolic function. No ischemia.   Heart Catheterization: LHC 02/26/2021:  LV: 182/0, EDP 13 mmHg.  Aortic pressure 170/87, mean 117 mmHg.  There was no pressure gradient across the aortic valve. LVEF: Global hypokinesis, size upper limit of normal, EF 20 to 25%. Coronary arteries: Very mild disease in the ramus intermediate constituting 30% otherwise minimal disease in the proximal and mid LAD of 10% and circumflex proximal and mid 10%.  Right coronary artery is normal. LM: Smooth and normal. LAD: Large vessel, gives origin to a moderate-sized D1 and several small diagonals, has minimal disease in the proximal segment. CX: Large vessel, has mild luminal irregularity in the proximal segment.   RI: Mild diffuse luminal irregularity.  Constitutes a proximal 20 to  30% stenosis. RCA: Dominant.  Normal.  LABORATORY DATA: CBC Latest Ref Rng & Units 02/26/2021 06/30/2020 03/03/2016  WBC 4.0 - 10.5 K/uL 6.5 7.0 -  Hemoglobin 13.0 - 17.0 g/dL 13.7 14.8 15.6  Hematocrit 39.0 - 52.0 % 41.6 47.5 46.0  Platelets 150 - 400 K/uL 304 289 -    CMP Latest Ref Rng & Units 03/26/2021 02/28/2021 02/27/2021  Glucose 65 - 99 mg/dL 83 94 109(H)  BUN 8 - 27 mg/dL 12 18 11   Creatinine 0.76 - 1.27 mg/dL  0.86 1.11 1.05  Sodium 134 - 144 mmol/L 141 133(L) 133(L)  Potassium 3.5 - 5.2 mmol/L 4.0 3.7 3.6  Chloride 96 - 106 mmol/L 104 97(L) 99  CO2 20 - 29 mmol/L 20 25 24   Calcium 8.6 - 10.2 mg/dL 9.5 9.1 9.0  Total Protein 6.5 - 8.1 g/dL - - -  Total Bilirubin 0.3 - 1.2 mg/dL - - -  Alkaline Phos 38 - 126 U/L - - -  AST 15 - 41 U/L - - -  ALT 17 - 63 U/L - - -    Lipid Panel  Lab Results  Component Value Date   CHOL 192 02/27/2021   HDL 49 02/27/2021   LDLCALC 129 (H) 02/27/2021   TRIG 68 02/27/2021   CHOLHDL 3.9 02/27/2021     No components found for: NTPROBNP Recent Labs    03/26/21 1234  PROBNP 597*   Recent Labs    02/26/21 1151  TSH 1.394    BMP Recent Labs    02/26/21 0826 02/27/21 0620 02/28/21 0254 03/26/21 1234  NA 134* 133* 133* 141  K 3.7 3.6 3.7 4.0  CL 102 99 97* 104  CO2 22 24 25 20   GLUCOSE 117* 109* 94 83  BUN 11 11 18 12   CREATININE 0.96 1.05 1.11 0.86  CALCIUM 9.0 9.0 9.1 9.5  GFRNONAA >60 >60 >60  --     HEMOGLOBIN A1C Lab Results  Component Value Date   HGBA1C 5.0 02/26/2021   MPG 96.8 02/26/2021    IMPRESSION:    ICD-10-CM   1. Nonischemic cardiomyopathy (HCC)  A26.3 Basic metabolic panel    2. Lightheadedness  R42     3. HFrEF (heart failure with reduced ejection fraction) ([place])  F35.45 Basic metabolic panel       RECOMMENDATIONS: Sean Leblanc is a 60 y.o. male whose past medical history and cardiac risk factors include: non-STEMI (02/2021), nonischemic cardiomyopathy, acute HFrEF (02/2021), TIA (August 2017), history of cardiomyopathy, hypertension, hyperlipidemia, coronary artery atherosclerosis and aortic atherosclerosis (not gated CT 06/30/2020) cigarette smoking, alcohol use, cocaine use, marijuana use.   Acute HFrEF (heart failure with reduced ejection fraction) ([place]) Patient has had no recurrence of symptomatic hypotension. Continue Entresto 24/26 mg twice daily.  Continue carvedilol, Farxiga, and as needed  Bumex. Patient's blood pressure is now elevated, will therefore add spironolactone 12.5 mg daily with repeat BMP in 1 week. We will tentatively plan to repeat echocardiogram at follow-up visit to reevaluate LVEF.  Nonischemic cardiomyopathy ([place]) Management as above Will continue up titration of guideline directed medical therapy as renal function and hemodynamics allow.  Coronary atherosclerosis due to calcified coronary lesion Continue Asprin  Continue atorvastatin  Mixed hyperlipidemia Continue statin therapy  Benign hypertension Blood pressure is elevated in the office today, will start spironolactone 12.5 mg daily. Continue low-salt diet.   Patient is congratulated for his efforts in refraining from smoking cigarettes or consuming marijuana or cocaine.    FINAL  MEDICATION LIST END OF ENCOUNTER: Meds ordered this encounter  Medications   spironolactone (ALDACTONE) 25 MG tablet    Sig: Take 0.5 tablets (12.5 mg total) by mouth daily.    Dispense:  45 tablet    Refill:  3     There are no discontinued medications.    Current Outpatient Medications:    aspirin EC 81 MG tablet, Take 1 tablet (81 mg total) by mouth daily. Swallow whole., Disp: 30 tablet, Rfl: 11   atorvastatin (LIPITOR) 40 MG tablet, Take 1 tablet (40 mg total) by mouth daily., Disp: 90 tablet, Rfl: 3   bumetanide (BUMEX) 1 MG tablet, TAKE 1 TABLET(1 MG) BY MOUTH DAILY, Disp: 30 tablet, Rfl: 0   carvedilol (COREG) 6.25 MG tablet, TAKE 1 TABLET BY MOUTH TWICE A DAY WITH A MEAL HOLD IF SYSTOLIC BLOOD PRESSURE LESS THAN 100MG HG OR PULSE LESS THAN 60 BPM, Disp: 60 tablet, Rfl: 0   FARXIGA 10 MG TABS tablet, TAKE 1 TABLET(10 MG) BY MOUTH DAILY, Disp: 30 tablet, Rfl: 0   sacubitril-valsartan (ENTRESTO) 24-26 MG, Take 1 tablet by mouth 2 (two) times daily., Disp: 60 tablet, Rfl: 3   spironolactone (ALDACTONE) 25 MG tablet, Take 0.5 tablets (12.5 mg total) by mouth daily., Disp: 45 tablet, Rfl: 3  Orders Placed This  Encounter  Procedures   Basic metabolic panel     There are no Patient Instructions on file for this visit.   --Continue cardiac medications as reconciled in final medication list. --Return in about 3 months (around 07/14/2021) for HFrEF. Or sooner if needed. --Continue follow-up with your primary care physician regarding the management of your other chronic comorbid conditions.  Patient's questions and concerns were addressed to his satisfaction. He voices understanding of the instructions provided during this encounter.   This note was created using a voice recognition software as a result there may be grammatical errors inadvertently enclosed that do not reflect the nature of this encounter. Every attempt is made to correct such errors.   Alethia Berthold, PA-C 04/13/2021, 2:02 PM Office: [phone]

## 2021-04-14 ENCOUNTER — Other Ambulatory Visit: Payer: Self-pay

## 2021-04-14 MED ORDER — DAPAGLIFLOZIN PROPANEDIOL 10 MG PO TABS: ORAL_TABLET | ORAL | 3 refills | Status: DC

## 2021-04-14 MED ORDER — ENTRESTO 24-26 MG PO TABS: 1.0000 | ORAL_TABLET | Freq: Two times a day (BID) | ORAL | 3 refills | Status: DC

## 2021-04-29 ENCOUNTER — Other Ambulatory Visit: Payer: Self-pay | Admitting: Cardiology

## 2021-04-29 DIAGNOSIS — I5021 Acute systolic (congestive) heart failure: Secondary | ICD-10-CM

## 2021-05-27 ENCOUNTER — Other Ambulatory Visit: Payer: Self-pay | Admitting: Cardiology

## 2021-07-01 ENCOUNTER — Other Ambulatory Visit: Payer: Self-pay | Admitting: Student

## 2021-07-14 ENCOUNTER — Ambulatory Visit: Payer: BC Managed Care – PPO | Admitting: Student

## 2021-08-05 ENCOUNTER — Other Ambulatory Visit: Payer: Self-pay | Admitting: Student

## 2021-09-03 ENCOUNTER — Other Ambulatory Visit: Payer: Self-pay | Admitting: Cardiology

## 2021-09-03 ENCOUNTER — Other Ambulatory Visit: Payer: Self-pay | Admitting: Student

## 2021-09-14 ENCOUNTER — Ambulatory Visit: Payer: BC Managed Care – PPO | Admitting: Internal Medicine

## 2021-09-14 ENCOUNTER — Other Ambulatory Visit (HOSPITAL_COMMUNITY): Payer: Self-pay

## 2021-09-14 DIAGNOSIS — I110 Hypertensive heart disease with heart failure: Secondary | ICD-10-CM | POA: Diagnosis not present

## 2021-09-14 DIAGNOSIS — Z720 Tobacco use: Secondary | ICD-10-CM | POA: Diagnosis not present

## 2021-09-14 DIAGNOSIS — I5021 Acute systolic (congestive) heart failure: Secondary | ICD-10-CM

## 2021-09-14 DIAGNOSIS — I10 Essential (primary) hypertension: Secondary | ICD-10-CM

## 2021-09-14 MED ORDER — NICOTINE POLACRILEX 4 MG MT GUM
4.0000 mg | CHEWING_GUM | OROMUCOSAL | 0 refills | Status: DC | PRN
  Filled 2021-09-14: qty 110, 30d supply, fill #0

## 2021-09-14 MED ORDER — ATORVASTATIN CALCIUM 40 MG PO TABS
40.0000 mg | ORAL_TABLET | Freq: Every day | ORAL | 3 refills | Status: DC
  Filled 2021-09-14: qty 90, 90d supply, fill #0

## 2021-09-14 MED ORDER — VARENICLINE TARTRATE 1 MG PO TABS
1.0000 mg | ORAL_TABLET | Freq: Two times a day (BID) | ORAL | 1 refills | Status: DC
  Filled 2021-09-14: qty 60, 30d supply, fill #0

## 2021-09-14 NOTE — Assessment & Plan Note (Addendum)
Patient reports non-adherence to spiro and entresto. He has been taking Bumax when he notices fluid. He is asymptomatic today. Denies any CP, SHOb, DOE or orthopnea. No reported LEE. ?Does not appear to be volume overloaded and weight has stayed stable. ?Advised he restart entresto and spiro.  ?Will continue other GDMT as previously prescribed. ?

## 2021-09-14 NOTE — Patient Instructions (Addendum)
Dear Mr. Fronczak, ? ?Thank you for trusting [place] with your care today. ? ?For your blood pressure and heart failure: ?Please take the carvedilol 6.25 twice daily.  ?Please take the [facility] 24-26 twice daily. ?Please take the Spironolactone 25 daily. ?Please take the Farxiga 10 daily.  ? ?I have refilled your atorvastatin. Please pick this up from the pharmacy. ? ?For your smoking: ?I have prescribed chantix and nicotine gum ?For the chantix, please take 1/2 tab once daily for the first 3 days. For days 4-7 take 1/2 tab twice daily. After day 7, take 1 tablet twice daily. Please also use the nicotine gum whenever you are feeling a craving.  ? ?We would like to see you back in the clinic in 1 month for a blood pressure check and labs.  ? ?

## 2021-09-14 NOTE — Assessment & Plan Note (Signed)
Patient reports he continues to smoke approximately 1 pack of cigarettes per week. He is interested in cutting back. ?Discussed Chantix and nicotine gum with him. He was open to thee two treatment options. ?We will start him on Chantix and nicotine gum.  ?

## 2021-09-14 NOTE — Assessment & Plan Note (Addendum)
Presents for BP follow up.  ?He is prescribed coreg, entresto, spiro, and bumex. He was not taking spiro or entresto as he thought they had been discontinued at his last cardiology visit. ?BP elevated today due to medication non-adherence.  ?No changes to management.  Advised he restart taking meds as prescribed. ?We will plan to see him back in 1 month for BP check and BMP check. ?

## 2021-09-14 NOTE — Progress Notes (Signed)
? ?  CC: 6 month follow up ? ?HPI:[name] is a 61 y.o. male who presents for evaluation of blood pressure. Please see individual problem based A/P for details. ? ? ?Depression, PHQ-9: ?Based on the patients  ?Sean Leblanc Office Visit from 03/17/2021 in [place]  ?PHQ-9 Total Score 4  ? ?  ? score we have 4. ? ?Past Medical History:  ?Diagnosis Date  ? Atherosclerosis of aorta ([place])   ? Cardiomyopathy ([place])   ? CHF (congestive heart failure) ([place])   ? Coronary atherosclerosis due to calcified coronary lesion   ? Hyperlipidemia   ? Hypertension   ? TIA (transient ischemic attack)   ? ?Review of Systems:   ?Review of Systems  ?Constitutional: Negative.   ?HENT: Negative.    ?Respiratory: Negative.    ?Cardiovascular: Negative.   ?Gastrointestinal: Negative.   ?Genitourinary: Negative.   ?Musculoskeletal: Negative.    ? ?Physical Exam: ?There were no vitals filed for this visit. ? ? ?General: alert and oriented ?HEENT: Conjunctiva nl , antiicteric sclerae, moist mucous membranes, no exudate or erythema ?Cardiovascular: Normal rate, regular rhythm.  No murmurs, rubs, or gallops ?Pulmonary : Equal breath sounds, No wheezes, rales, or rhonchi ?Abdominal: soft, nontender,  bowel sounds present ?Ext: No edema in lower extremities, no tenderness to palpation of lower extremities.  ? ?Assessment & Plan:  ? ?See Encounters Tab for problem based charting. ? ?Patient discussed with Dr.  Saverio Danker ? ?

## 2021-09-15 ENCOUNTER — Encounter: Payer: Self-pay | Admitting: Internal Medicine

## 2021-09-15 ENCOUNTER — Other Ambulatory Visit (HOSPITAL_COMMUNITY): Payer: Self-pay

## 2021-09-15 MED ORDER — VARENICLINE TARTRATE 0.5 MG PO TABS
ORAL_TABLET | ORAL | 0 refills | Status: DC
  Filled 2021-09-15: qty 11, 7d supply, fill #0

## 2021-09-15 MED ORDER — VARENICLINE TARTRATE 1 MG PO TABS: 1.0000 mg | ORAL_TABLET | Freq: Two times a day (BID) | ORAL | 5 refills | Status: DC

## 2021-09-15 MED ORDER — VARENICLINE TARTRATE 1 MG PO TABS
1.0000 mg | ORAL_TABLET | Freq: Two times a day (BID) | ORAL | 5 refills | Status: DC
Start: 2021-10-15 — End: 2021-12-09

## 2021-09-15 MED ORDER — VARENICLINE TARTRATE 0.5 MG X 11 & 1 MG X 42 PO TBPK
ORAL_TABLET | ORAL | 0 refills | Status: DC
  Filled 2021-09-15: qty 1, fill #0

## 2021-09-15 NOTE — Progress Notes (Signed)
Internal Medicine Clinic Attending ? ?Case discussed with Dr. Elliot Gurney  at the time of the visit.  We reviewed the resident?s history and exam and pertinent patient test results.  I agree with the assessment, diagnosis, and plan of care documented in the resident?s note. Patient with HFrEF (EF 20-25%) 2/2 nonischemic cardiomyopathy on sub-optimal GDMT due to patient unsure of what he was supposed to be taking. Currently taking carvedilol and dapagliflozin. Elevated BP in clinic today, discussed resuming lower dose Entresto as previously prescribed as well as low dose spironolactone. Also recommended f/u with cardiology as he missed his f/u appointment in 07/2021.  ? ?

## 2021-09-15 NOTE — Addendum Note (Signed)
Addended by: Charise Killian on: 09/15/2021 10:27 AM ? ? Modules accepted: Orders ? ?

## 2021-09-15 NOTE — Addendum Note (Signed)
Addended by: Charise Killian on: 09/15/2021 11:36 AM ? ? Modules accepted: Orders, Level of Service ? ?

## 2021-09-15 NOTE — Addendum Note (Signed)
Addended by: Charise Killian on: 09/15/2021 11:47 AM ? ? Modules accepted: Orders ? ?

## 2021-09-17 ENCOUNTER — Other Ambulatory Visit (HOSPITAL_COMMUNITY): Payer: Self-pay

## 2021-11-23 ENCOUNTER — Other Ambulatory Visit: Payer: Self-pay

## 2021-11-24 MED ORDER — CARVEDILOL 6.25 MG PO TABS: ORAL_TABLET | ORAL | 1 refills | Status: DC

## 2021-12-09 ENCOUNTER — Encounter: Payer: Self-pay | Admitting: Student

## 2021-12-09 ENCOUNTER — Ambulatory Visit (INDEPENDENT_AMBULATORY_CARE_PROVIDER_SITE_OTHER): Payer: 59 | Admitting: Student

## 2021-12-09 ENCOUNTER — Other Ambulatory Visit (HOSPITAL_COMMUNITY): Payer: Self-pay

## 2021-12-09 VITALS — BP 137/97 | HR 82 | Temp 98.0°F | Ht 71.0 in | Wt 145.0 lb

## 2021-12-09 DIAGNOSIS — Z87891 Personal history of nicotine dependence: Secondary | ICD-10-CM

## 2021-12-09 DIAGNOSIS — I110 Hypertensive heart disease with heart failure: Secondary | ICD-10-CM

## 2021-12-09 DIAGNOSIS — I5021 Acute systolic (congestive) heart failure: Secondary | ICD-10-CM | POA: Diagnosis not present

## 2021-12-09 DIAGNOSIS — I10 Essential (primary) hypertension: Secondary | ICD-10-CM

## 2021-12-09 DIAGNOSIS — Z1211 Encounter for screening for malignant neoplasm of colon: Secondary | ICD-10-CM | POA: Diagnosis not present

## 2021-12-09 DIAGNOSIS — Z720 Tobacco use: Secondary | ICD-10-CM

## 2021-12-09 MED ORDER — VARENICLINE TARTRATE 0.5 MG PO TABS
ORAL_TABLET | ORAL | 0 refills | Status: DC
  Filled 2021-12-09: qty 11, 7d supply, fill #0

## 2021-12-09 MED ORDER — VARENICLINE TARTRATE 1 MG PO TABS
1.0000 mg | ORAL_TABLET | Freq: Two times a day (BID) | ORAL | 5 refills | Status: DC
Start: 2021-12-09 — End: 2022-11-07
  Filled 2021-12-09: qty 60, 30d supply, fill #0

## 2021-12-09 MED ORDER — ENTRESTO 24-26 MG PO TABS
1.0000 | ORAL_TABLET | Freq: Two times a day (BID) | ORAL | 11 refills | Status: DC
  Filled 2021-12-09: qty 60, 30d supply, fill #0

## 2021-12-09 MED ORDER — SPIRONOLACTONE 25 MG PO TABS
12.5000 mg | ORAL_TABLET | Freq: Every day | ORAL | 5 refills | Status: DC
  Filled 2021-12-09: qty 30, 60d supply, fill #0

## 2021-12-09 MED ORDER — ATORVASTATIN CALCIUM 40 MG PO TABS
40.0000 mg | ORAL_TABLET | Freq: Every day | ORAL | 11 refills | Status: DC
  Filled 2021-12-09: qty 30, 30d supply, fill #0
  Filled 2022-02-23: qty 30, 30d supply, fill #1

## 2021-12-09 NOTE — Assessment & Plan Note (Signed)
Patient reports that he continues to smoke about 1 pack per week. He tried nicorette gum but accidentally swallowed one and did not feel well afterwards. Does not wish to continue that. He tried Chantix and states that he did find improvement with it, but did not have enough to continue taking. On chart review, appears that he picked up the 0.'5mg'$  dose but did not pick up the '1mg'$  dose thereafter. Discussed that we can retry the Chantix to see if it helps with cessation as he is still interested in quitting.   Plan: -discontinued nicorette gum -prescribed Chantix (added note to pharmacy to provide both doses so patient can titrate up at home) -continue encouraging smoking cessation at subsequent visits

## 2021-12-09 NOTE — Assessment & Plan Note (Signed)
Today's Vitals   12/09/21 1040  BP: (!) 137/97  Pulse: 82  Temp: 98 F (36.7 C)  TempSrc: Oral  SpO2: 96%  Weight: 145 lb (65.8 kg)  Height: '5\' 11"'$  (1.803 m)   Body mass index is 20.22 kg/m.  Patient with history of HTN here for follow up. He is prescribed coreg, entresto, spiro, and bumex. From these, he has only been adherent to coreg. Despite counseling at last visit to restart entresto and spiro, he has not picked up any refills. Discussed importance of restarting these medications for optimal BP control (above goal of <130/80 at today's visit). He confirms understanding. He is not volume overloaded on exam despite nonadherence with bumex so will hold off on restarting this until he is seen again by Cardiology. He missed his last Cardiology appt and I discussed with him to reschedule with them.   Plan: -continue coreg and farxiga -restart Entresto and Arlyce Harman -hold bumex for now -f/u in 2 weeks to evaluate kidney function -reschedule appointment with Cardiology

## 2021-12-09 NOTE — Progress Notes (Signed)
   CC: f/u HTN, tobacco use disorder  HPI:  Sean Leblanc is a 61 y.o. male with history listed below presenting to the [facility] for f/u of HTN, tobacco use disorder. Please see individualized problem based charting for full HPI.  Past Medical History:  Diagnosis Date   Atherosclerosis of aorta (HCC)    Cardiomyopathy (HCC)    CHF (congestive heart failure) (HCC)    Coronary atherosclerosis due to calcified coronary lesion    Hyperlipidemia    Hypertension    TIA (transient ischemic attack)     Review of Systems:  Negative aside from that listed in individualized problem based charting.  Physical Exam:  Vitals:   12/09/21 1040  BP: (!) 137/97  Pulse: 82  Temp: 98 F (36.7 C)  TempSrc: Oral  SpO2: 96%  Weight: 145 lb (65.8 kg)  Height: '5\' 11"'$  (1.803 m)   Physical Exam Constitutional:      Appearance: Normal appearance. He is normal weight. He is not ill-appearing.  HENT:     Nose: Nose normal. No congestion.     Mouth/Throat:     Mouth: Mucous membranes are moist.     Pharynx: Oropharynx is clear. No oropharyngeal exudate.  Eyes:     Extraocular Movements: Extraocular movements intact.     Conjunctiva/sclera: Conjunctivae normal.     Pupils: Pupils are equal, round, and reactive to light.  Cardiovascular:     Rate and Rhythm: Normal rate and regular rhythm.     Pulses: Normal pulses.     Heart sounds: Normal heart sounds. No murmur heard.   No friction rub. No gallop.  Pulmonary:     Effort: Pulmonary effort is normal.     Breath sounds: Normal breath sounds. No wheezing, rhonchi or rales.  Abdominal:     General: Bowel sounds are normal. There is no distension.     Palpations: Abdomen is soft.     Tenderness: There is no abdominal tenderness.  Musculoskeletal:        General: Normal range of motion.     Comments: No peripheral edema noted bilaterally.  Skin:    General: Skin is warm and dry.  Neurological:     General: No focal deficit present.      Mental Status: He is alert and oriented to person, place, and time.  Psychiatric:        Mood and Affect: Mood normal.        Behavior: Behavior normal.        Thought Content: Thought content normal.        Judgment: Judgment normal.     Assessment & Plan:   See Encounters Tab for problem based charting.  Patient discussed with Dr.  Saverio Danker

## 2021-12-09 NOTE — Assessment & Plan Note (Signed)
Patient with history of HFrEF. He was prescribed entresto, coreg, farxiga, bumex, and spiro for GDMT. He has only been adherent with coreg and farxiga from these despite counseling during last visit to restart entresto and spiro. Reemphasized importance of entresto and spiro along with continuing coreg and farxiga to optimize management of HFrEF. He confirms understanding. Fortunately, he is not volume overloaded on exam and is not complaining of orthopnea, DOE, edema at home. He missed his cardiology appointment earlier this year and I have advised him to reschedule. Will hold off on bumex until he sees them. Will restart entresto and spiro today.   Plan: -continue farxiga and coreg -restart entresto and spiro -hold bumex for now -reschedule appt with cardiology -f/u in 2 weeks for kidney function check

## 2021-12-09 NOTE — Patient Instructions (Signed)
Sean Leblanc,  It was a pleasure seeing you in the clinic today.   Please continue taking your Carvedilol and Wilder Glade as you have been. It is very important that you restart taking your Entresto (twice daily) and Spironolactone (daily) to help with your blood pressure and your heart. I have sent these prescriptions to the [facility]. I have also refilled your cholesterol medicine (Lipitor). I refilled Chantix for you to help with stopping cigarette use. Please use these as prescribed. Please call your heart doctor's office to schedule an appointment with them so that they can make sure your heart is doing well. Please complete the stool sample screening for colon cancer and bring it back to the clinic as discussed. Please come back in 2 weeks so that we can check your kidney function.  Please call our clinic at [phone] if you have any questions or concerns. The best time to call is Monday-Friday from 9am-4pm, but there is someone available 24/7 at the same number. If you need medication refills, please notify your pharmacy one week in advance and they will send [place] a request.   Thank you for letting us take part in your care. We look forward to seeing you next time!

## 2021-12-09 NOTE — Assessment & Plan Note (Signed)
FIT testing ordered today. 

## 2021-12-10 NOTE — Progress Notes (Signed)
Internal Medicine Clinic Attending  Case discussed with Dr. Allyson Sabal  At the time of the visit.  We reviewed the resident's history and exam and pertinent patient test results.  I agree with the assessment, diagnosis, and plan of care documented in the resident's note. Sean Leblanc is partially adherent to GDMT, working on adding back other components as tolerated. Given no evidence of volume overload, we have held off on resuming loop diuretic in favor of other agents. Could consider weight-based dosing at future visit for contingency planning.

## 2021-12-23 ENCOUNTER — Other Ambulatory Visit: Payer: Self-pay | Admitting: Student

## 2021-12-23 ENCOUNTER — Other Ambulatory Visit: Payer: Self-pay

## 2021-12-23 ENCOUNTER — Ambulatory Visit (INDEPENDENT_AMBULATORY_CARE_PROVIDER_SITE_OTHER): Payer: 59 | Admitting: Student

## 2021-12-23 ENCOUNTER — Encounter: Payer: Self-pay | Admitting: Student

## 2021-12-23 ENCOUNTER — Other Ambulatory Visit (HOSPITAL_COMMUNITY): Payer: Self-pay

## 2021-12-23 VITALS — BP 129/96 | HR 64 | Temp 98.0°F | Ht 72.0 in | Wt 143.0 lb

## 2021-12-23 DIAGNOSIS — I110 Hypertensive heart disease with heart failure: Secondary | ICD-10-CM

## 2021-12-23 DIAGNOSIS — Z1211 Encounter for screening for malignant neoplasm of colon: Secondary | ICD-10-CM

## 2021-12-23 DIAGNOSIS — I5021 Acute systolic (congestive) heart failure: Secondary | ICD-10-CM | POA: Diagnosis not present

## 2021-12-23 DIAGNOSIS — I10 Essential (primary) hypertension: Secondary | ICD-10-CM

## 2021-12-23 DIAGNOSIS — Z87891 Personal history of nicotine dependence: Secondary | ICD-10-CM | POA: Diagnosis not present

## 2021-12-23 DIAGNOSIS — Z720 Tobacco use: Secondary | ICD-10-CM

## 2021-12-23 MED ORDER — ENTRESTO 49-51 MG PO TABS
1.0000 | ORAL_TABLET | Freq: Two times a day (BID) | ORAL | 11 refills | Status: DC
  Filled 2021-12-23: qty 60, 30d supply, fill #0
  Filled 2022-02-23: qty 60, 30d supply, fill #1

## 2021-12-23 MED ORDER — NICOTINE POLACRILEX 4 MG MT GUM
4.0000 mg | CHEWING_GUM | OROMUCOSAL | 0 refills | Status: DC | PRN
  Filled 2021-12-23: qty 110, 30d supply, fill #0

## 2021-12-23 NOTE — Assessment & Plan Note (Signed)
Patient with history of tobacco use, restarted on chantix at last visit. He has been doing well with initiation of chantix. He is down to half a cigarette in the mornings and half a cigarette at night. He has also started using his nicorette gum to help with intermittent cravings. He is tolerating chantix well with no reported side effects. He is now on Chantix '1mg'$  BID. I congratulated him on his progress and encouraged choosing a quit date for complete cessation. He states that he will work on it. Since he has noted help with nicorette gum, I will re-prescribe it for him.   Plan: -continue chantix '1mg'$  BID -nicorette gum -choose a quit date -continued counseling at future visits

## 2021-12-23 NOTE — Assessment & Plan Note (Signed)
Patient with history of HFrEF, currently taking entresto, coreg, farxiga, spiro for GDMT. He remains euvolemic on exam so will continue to hold bumex until he is seen by cardiology. He denies any orthopnea, chest pain, SHOB, edema at home. Increasing dose of entresto to get BP to goal.   Plan: -continue farxiga, spiro, coreg -increased entresto to 49-'51mg'$  BID -hold bumex -reschedule appt with cardiology -f/u BMP

## 2021-12-23 NOTE — Patient Instructions (Addendum)
Sean Leblanc,  It was a pleasure seeing you in the clinic today.   I have increased the dose of your Entresto. Please keep taking it like you have been (twice a day). Please make sure to call your heart doctor to reschedule your appointment. The clinic is named [facility] and their phone number is [phone]. We are checking your kidney function and electrolytes with lab work today. I will call you with the results. Please come back for your next visit in 1 month.  Please call our clinic at [phone] if you have any questions or concerns. The best time to call is Monday-Friday from 9am-4pm, but there is someone available 24/7 at the same number. If you need medication refills, please notify your pharmacy one week in advance and they will send [place] a request.   Thank you for letting us take part in your care. We look forward to seeing you next time!

## 2021-12-23 NOTE — Assessment & Plan Note (Addendum)
Today's Vitals   12/23/21 1412 12/23/21 1428  BP: (!) 140/92 (!) 129/96  Pulse: 67 64  Temp: 98 F (36.7 C)   TempSrc: Oral   SpO2: 99%   Weight: 143 lb (64.9 kg)   Height: 6' (1.829 m)   PainSc: 0-No pain    Body mass index is 19.39 kg/m.  Patient with history of HTN here for follow up. He is currently taking coreg, farxiga, entresto, spiro. Entresto and spiro were restarted at last visit. He was previously on bumex but this has been held as he has been euvolemic and has not yet been seen by cardiology in the outpatient setting (missed last appointment). He remains euvolemic today and will continue to hold bumex at this time. His BP and repeat were elevated above goal today. Pulse was borderline, so will avoid increasing coreg to minimize risk for bradycardia. Will instead increase his Entresto to 49-'51mg'$  BID today. Checking BMP. Provided information for [facility] so that he can reschedule his appointment.  Plan: -continue coreg, farxiga, spiro -increased entresto to 49-'51mg'$  BID -hold bumex -f/u BMP -reschedule appointment with [facility] -f/u in 1 month for recheck

## 2021-12-23 NOTE — Progress Notes (Signed)
   CC: f/u HTN, HFrEF  HPI:  Sean Leblanc is a 61 y.o. male with history listed below presenting to the [facility] for f/u HTN, HFrEF. Please see individualized problem based charting for full HPI.  Past Medical History:  Diagnosis Date   Anginal equivalent ([place])    Atherosclerosis of aorta (HCC)    Cardiomyopathy ([place])    CHF (congestive heart failure) (HCC)    Coronary atherosclerosis due to calcified coronary lesion    Hyperlipidemia    Hypertension    Hypertensive urgency 03/04/2016   TIA (transient ischemic attack)     Review of Systems:  Negative aside from that listed in individualized problem based charting.  Physical Exam:  Vitals:   12/23/21 1412  BP: (!) 140/92  Pulse: 67  Temp: 98 F (36.7 C)  TempSrc: Oral  SpO2: 99%  Weight: 143 lb (64.9 kg)  Height: 6' (1.829 m)   Physical Exam Constitutional:      Appearance: He is not ill-appearing.     Comments: Thin  HENT:     Mouth/Throat:     Mouth: Mucous membranes are moist.     Pharynx: Oropharynx is clear.  Eyes:     Extraocular Movements: Extraocular movements intact.     Conjunctiva/sclera: Conjunctivae normal.     Pupils: Pupils are equal, round, and reactive to light.  Cardiovascular:     Rate and Rhythm: Normal rate and regular rhythm.     Pulses: Normal pulses.     Heart sounds: Normal heart sounds. No murmur heard.    No friction rub. No gallop.  Pulmonary:     Effort: Pulmonary effort is normal.     Breath sounds: Normal breath sounds. No wheezing, rhonchi or rales.  Abdominal:     General: Bowel sounds are normal. There is no distension.     Palpations: Abdomen is soft.     Tenderness: There is no abdominal tenderness.  Musculoskeletal:        General: Normal range of motion.     Comments: Trace edema in bilateral lower extremities.  Skin:    General: Skin is warm and dry.  Neurological:     General: No focal deficit present.     Mental Status: He is alert and oriented to person, place,  and time.  Psychiatric:        Mood and Affect: Mood normal.        Behavior: Behavior normal.      Assessment & Plan:   See Encounters Tab for problem based charting.  Patient discussed with Dr.  Cain Sieve

## 2021-12-24 ENCOUNTER — Other Ambulatory Visit (HOSPITAL_COMMUNITY): Payer: Self-pay

## 2021-12-24 LAB — BMP8+ANION GAP
Anion Gap: 19 mmol/L — ABNORMAL HIGH (ref 10.0–18.0)
BUN/Creatinine Ratio: 11 (ref 10–24)
BUN: 9 mg/dL (ref 8–27)
CO2: 17 mmol/L — ABNORMAL LOW (ref 20–29)
Calcium: 10.1 mg/dL (ref 8.6–10.2)
Chloride: 108 mmol/L — ABNORMAL HIGH (ref 96–106)
Creatinine, Ser: 0.82 mg/dL (ref 0.76–1.27)
Glucose: 115 mg/dL — ABNORMAL HIGH (ref 70–99)
Potassium: 4.2 mmol/L (ref 3.5–5.2)
Sodium: 144 mmol/L (ref 134–144)
eGFR: 100 mL/min/{1.73_m2} (ref >59)

## 2021-12-24 LAB — FECAL OCCULT BLOOD, IMMUNOCHEMICAL: Fecal Occult Bld: NEGATIVE

## 2021-12-24 NOTE — Progress Notes (Signed)
Internal Medicine Clinic Attending  Case discussed with Dr. Jinwala  At the time of the visit.  We reviewed the resident's history and exam and pertinent patient test results.  I agree with the assessment, diagnosis, and plan of care documented in the resident's note.  

## 2021-12-30 ENCOUNTER — Other Ambulatory Visit (HOSPITAL_COMMUNITY): Payer: Self-pay

## 2022-02-21 ENCOUNTER — Other Ambulatory Visit: Payer: Self-pay | Admitting: Student

## 2022-02-23 ENCOUNTER — Other Ambulatory Visit: Payer: Self-pay

## 2022-02-23 ENCOUNTER — Other Ambulatory Visit (HOSPITAL_COMMUNITY): Payer: Self-pay

## 2022-02-23 DIAGNOSIS — I5021 Acute systolic (congestive) heart failure: Secondary | ICD-10-CM

## 2022-02-23 MED ORDER — ENTRESTO 49-51 MG PO TABS: 1.0000 | ORAL_TABLET | Freq: Two times a day (BID) | ORAL | 11 refills | Status: DC

## 2022-02-23 MED ORDER — ATORVASTATIN CALCIUM 40 MG PO TABS
40.0000 mg | ORAL_TABLET | Freq: Every day | ORAL | 11 refills | Status: DC
Start: 2022-02-23 — End: 2022-08-11

## 2022-02-23 NOTE — Telephone Encounter (Signed)
Patient would like fr prescriptions to be sent to the [facility] on [address].

## 2022-07-27 ENCOUNTER — Emergency Department (HOSPITAL_COMMUNITY): Payer: BC Managed Care – PPO

## 2022-07-27 ENCOUNTER — Emergency Department (HOSPITAL_COMMUNITY)
Admission: EM | Admit: 2022-07-27 | Discharge: 2022-07-27 | Disposition: A | Discharge status: Home / Self Care | Payer: BC Managed Care – PPO | Attending: Emergency Medicine | Admitting: Emergency Medicine

## 2022-07-27 ENCOUNTER — Other Ambulatory Visit: Payer: Self-pay

## 2022-07-27 DIAGNOSIS — Z20822 Contact with and (suspected) exposure to covid-19: Secondary | ICD-10-CM | POA: Diagnosis not present

## 2022-07-27 DIAGNOSIS — Z7982 Long term (current) use of aspirin: Secondary | ICD-10-CM | POA: Insufficient documentation

## 2022-07-27 DIAGNOSIS — R509 Fever, unspecified: Secondary | ICD-10-CM | POA: Diagnosis not present

## 2022-07-27 DIAGNOSIS — J069 Acute upper respiratory infection, unspecified: Secondary | ICD-10-CM | POA: Diagnosis not present

## 2022-07-27 DIAGNOSIS — I10 Essential (primary) hypertension: Secondary | ICD-10-CM | POA: Diagnosis not present

## 2022-07-27 DIAGNOSIS — R059 Cough, unspecified: Secondary | ICD-10-CM | POA: Diagnosis not present

## 2022-07-27 DIAGNOSIS — I517 Cardiomegaly: Secondary | ICD-10-CM | POA: Diagnosis not present

## 2022-07-27 DIAGNOSIS — R079 Chest pain, unspecified: Secondary | ICD-10-CM | POA: Diagnosis not present

## 2022-07-27 DIAGNOSIS — B9789 Other viral agents as the cause of diseases classified elsewhere: Secondary | ICD-10-CM | POA: Diagnosis not present

## 2022-07-27 LAB — RESP PANEL BY RT-PCR (RSV, FLU A&B, COVID)  RVPGX2
Influenza A by PCR: NEGATIVE
Influenza B by PCR: NEGATIVE
Resp Syncytial Virus by PCR: NEGATIVE
SARS Coronavirus 2 by RT PCR: NEGATIVE

## 2022-07-27 MED ORDER — BENZONATATE 100 MG PO CAPS: 100.0000 mg | ORAL_CAPSULE | Freq: Three times a day (TID) | ORAL | 0 refills | Status: DC

## 2022-07-27 NOTE — Discharge Instructions (Addendum)
You came to the emergency department with a cough and flulike symptoms for the past week.  You do not have the flu or COVID.  You likely have another viral illness that we do not test for.  Continue taking DayQuil and NyQuil over-the-counter.  Mucinex is a good option for any congestion.  I have sent the cough pills to your pharmacy that we discussed previously.  You may continue to use Delsym or Robitussin for cough symptoms as well as the other medications that you are taking.  Follow-up with internal medicine if you have any further concerns and please return to the emergency department with any worsening symptoms, especially chest pain and shortness of breath.  It was a pleasure to meet you and we hope you feel better.  Your work note is attached.

## 2022-07-27 NOTE — ED Provider Notes (Addendum)
[facility] EMERGENCY DEPARTMENT Provider Note   CSN: [id number] Arrival date & time: 07/27/22  0740     History  Chief Complaint  Patient presents with   Cough   Chills    Sean Leblanc is a 62 y.o. male with a history of hypertension presenting today with viral symptoms since last Friday.  Most concerned about a cough that is occasionally productive.  Also has had some chills.  No known sick contacts.  No shortness of breath or chest pain.   Cough      Home Medications Prior to Admission medications   Medication Sig Start Date End Date Taking? Authorizing Provider  benzonatate (TESSALON) 100 MG capsule Take 1 capsule (100 mg total) by mouth every 8 (eight) hours. 07/27/22  Yes Sharvi Mooneyhan A, PA-C  aspirin EC 81 MG tablet Take 1 tablet (81 mg total) by mouth daily. Swallow whole. 03/05/21   Tolia, Sunit, DO  atorvastatin (LIPITOR) 40 MG tablet Take 1 tablet (40 mg total) by mouth daily. 02/23/22   Atway, Rayann N, DO  bumetanide (BUMEX) 1 MG tablet TAKE 1 TABLET(1 MG) BY MOUTH DAILY 04/29/21   Tolia, Sunit, DO  carvedilol (COREG) 6.25 MG tablet TAKE 1 TABLET BY MOUTH TWICE DAILY 02/21/22   Atway, Rayann N, DO  FARXIGA 10 MG TABS tablet TAKE 1 TABLET(10 MG) BY MOUTH DAILY 12/23/21   Cantwell, Celeste C, PA-C  nicotine polacrilex (NICORETTE) 4 MG gum Chew 1 gum  (4 mg total) by mouth as needed for smoking cessation. 12/23/21   Virl Axe, MD  sacubitril-valsartan (ENTRESTO) 49-51 MG Take 1 tablet by mouth 2 (two) times daily. 02/23/22   Atway, Rayann N, DO  spironolactone (ALDACTONE) 25 MG tablet Take 0.5 tablets (12.5 mg total) by mouth daily. 12/09/21 12/04/22  Virl Axe, MD  varenicline (CHANTIX) 0.5 MG tablet Take 1 tablet by mouth daily on days 1-3, then take 1 tablet by mouth twice daily on days 4-7. 12/09/21   Virl Axe, MD  varenicline (CHANTIX) 1 MG tablet Take 1 tablet (1 mg total) by mouth 2 (two) times daily. 12/09/21   Virl Axe, MD       Allergies    Patient has no known allergies.    Review of Systems   Review of Systems  Respiratory:  Positive for cough.     Physical Exam Updated Vital Signs BP 129/87   Pulse 62   Resp 16   Ht 6' (1.829 m)   Wt 65.8 kg   SpO2 100%   BMI 19.67 kg/m  Physical Exam Vitals and nursing note reviewed.  Constitutional:      General: He is not in acute distress.    Appearance: Normal appearance. He is not ill-appearing.  HENT:     Head: Normocephalic and atraumatic.     Nose: Rhinorrhea present.     Mouth/Throat:     Mouth: Mucous membranes are moist.     Pharynx: Oropharynx is clear.  Eyes:     General: No scleral icterus.    Conjunctiva/sclera: Conjunctivae normal.  Cardiovascular:     Rate and Rhythm: Normal rate and regular rhythm.  Pulmonary:     Effort: Pulmonary effort is normal. No respiratory distress.     Breath sounds: No wheezing or rales.  Musculoskeletal:     Cervical back: Normal range of motion.  Skin:    Findings: No rash.  Neurological:     Mental Status: He is alert.  Psychiatric:  Mood and Affect: Mood normal.     ED Results / Procedures / Treatments   Labs (all labs ordered are listed, but only abnormal results are displayed) Labs Reviewed  RESP PANEL BY RT-PCR (RSV, FLU A&B, COVID)  [id number]    EKG None  Radiology DG Chest 2 View  Result Date: 07/27/2022 CLINICAL DATA:  Three day history of shortness of breath, productive cough, fever, and mid chest pain EXAM: CHEST - 2 VIEW COMPARISON:  Chest radiograph dated 02/26/2021 FINDINGS: Mildly hyperinflated lungs. No focal consolidations. No pleural effusion or pneumothorax. Similar enlarged cardiomediastinal silhouette. The visualized skeletal structures are unremarkable. IMPRESSION: 1. No acute cardiopulmonary process. 2. Similar cardiomegaly. Electronically Signed   By: Darrin Nipper M.D.   On: 07/27/2022 10:21    Procedures Procedures    Medications Ordered in ED Medications - No  data to display  ED Course/ Medical Decision Making/ A&P                             Medical Decision Making Amount and/or Complexity of Data Reviewed Radiology: ordered.  Risk Prescription drug management.   62 year old male presenting today with URI symptoms.  Symptoms have been going on for 10 days.  On physical exam they are well-appearing.  Tested negative for COVID, flu and RSV.   Chest x-ray ordered, viewed and interpreted by me.  I agree with radiology that there are no signs of pneumonia  We discussed that this sort of illness is something that needs to run its course and they may use ibuprofen, Tylenol, DayQuil/NyQuil and other over-the-counter medications for their symptoms.  Return precautions discussed and they understand that they may follow-up on their symptoms outpatient with either PCP or urgent care as needed for nonemergent symptoms.  Agreeable to discharge at this time.   Final Clinical Impression(s) / ED Diagnoses Final diagnoses:  Viral URI with cough    Rx / DC Orders ED Discharge Orders          Ordered    benzonatate (TESSALON) 100 MG capsule  Every 8 hours        07/27/22 0920           Results and diagnoses were explained to the patient. Return precautions discussed in full. Patient had no additional questions and expressed complete understanding.   This chart was dictated using voice recognition software.  Despite best efforts to proofread,  errors can occur which can change the documentation meaning.    Rhae Hammock, PA-C 07/27/22 1040    Rhae Hammock, PA-C 07/27/22 1040    Regan Lemming, MD 07/27/22 1042

## 2022-07-27 NOTE — ED Triage Notes (Signed)
Pt. Stated, Sean Leblanc had a cough with some mucous and some chills since last Friday

## 2022-08-11 ENCOUNTER — Encounter: Payer: Self-pay | Admitting: Student

## 2022-08-11 ENCOUNTER — Ambulatory Visit: Payer: Commercial Managed Care - HMO | Admitting: Student

## 2022-08-11 VITALS — BP 130/80 | HR 90 | Temp 98.5°F | Ht 72.0 in | Wt 147.2 lb

## 2022-08-11 DIAGNOSIS — I214 Non-ST elevation (NSTEMI) myocardial infarction: Secondary | ICD-10-CM | POA: Diagnosis not present

## 2022-08-11 DIAGNOSIS — I5022 Chronic systolic (congestive) heart failure: Secondary | ICD-10-CM | POA: Diagnosis not present

## 2022-08-11 DIAGNOSIS — Z0000 Encounter for general adult medical examination without abnormal findings: Secondary | ICD-10-CM

## 2022-08-11 DIAGNOSIS — Z8673 Personal history of transient ischemic attack (TIA), and cerebral infarction without residual deficits: Secondary | ICD-10-CM

## 2022-08-11 DIAGNOSIS — I110 Hypertensive heart disease with heart failure: Secondary | ICD-10-CM

## 2022-08-11 DIAGNOSIS — I429 Cardiomyopathy, unspecified: Secondary | ICD-10-CM | POA: Diagnosis not present

## 2022-08-11 DIAGNOSIS — I10 Essential (primary) hypertension: Secondary | ICD-10-CM

## 2022-08-11 DIAGNOSIS — I5021 Acute systolic (congestive) heart failure: Secondary | ICD-10-CM

## 2022-08-11 DIAGNOSIS — Z87891 Personal history of nicotine dependence: Secondary | ICD-10-CM

## 2022-08-11 DIAGNOSIS — E7849 Other hyperlipidemia: Secondary | ICD-10-CM

## 2022-08-11 MED ORDER — ATORVASTATIN CALCIUM 40 MG PO TABS: 40.0000 mg | ORAL_TABLET | Freq: Every day | ORAL | 3 refills | Status: DC

## 2022-08-11 MED ORDER — DAPAGLIFLOZIN PROPANEDIOL 10 MG PO TABS: ORAL_TABLET | ORAL | 3 refills | Status: DC

## 2022-08-11 MED ORDER — BUMETANIDE 1 MG PO TABS: ORAL_TABLET | ORAL | 0 refills | Status: DC

## 2022-08-11 MED ORDER — SPIRONOLACTONE 25 MG PO TABS: 12.5000 mg | ORAL_TABLET | Freq: Every day | ORAL | 2 refills | Status: DC

## 2022-08-11 MED ORDER — CARVEDILOL 6.25 MG PO TABS: 6.2500 mg | ORAL_TABLET | Freq: Two times a day (BID) | ORAL | 3 refills | Status: DC

## 2022-08-11 NOTE — Assessment & Plan Note (Signed)
Patient living with HTN, current on entresto, coreg, spironolactone, and farxiga. BP 133/97 with repeat 130/80. He was not taking his spironolactone or farxiga, which I have refilled today along with his coreg.  Plan: -continue current regimen

## 2022-08-11 NOTE — Patient Instructions (Addendum)
Sean Leblanc,  It was a pleasure seeing you in the clinic today.   I have refilled you carvedilol, atorvastatin, farxiga, and spironolactone. We are checking your cholesterol level today. I will call you with the results once I have them. I have asked our medication assistance team to look into your entresto to see if we can get it a reduced price for you. I do recommend call you heart doctor [facility] -- [phone]) and asking them if they can help with this. Please come back for your next visit in 3 months.  Please call our clinic at [phone] if you have any questions or concerns. The best time to call is Monday-Friday from 9am-4pm, but there is someone available 24/7 at the same number. If you need medication refills, please notify your pharmacy one week in advance and they will send [place] a request.   Thank you for letting us take part in your care. We look forward to seeing you next time!

## 2022-08-11 NOTE — Assessment & Plan Note (Signed)
Patient living with HFrEF, currently taking entresto, coreg, farxiga, and spironolactone for GDMT. He continues to remain euvolemic on exam. Bumex was changed to prn by cardiology so I have changed our medication list to reflect this. He does not need any at this time. Denies any orthopnea, chest pain, SHOB, edema. He did note that the cost of his entresto was increased with the dose change, now costing $85 which is cost-prohibitive for him. I have reached out to [facility] for medication assistance and have asked him to call his cardiologist [facility]) to see if they can provide any assistance with this.  Plan: -continue entresto, farxiga, spiro, coreg -bumex prn for weight gain >3 lbs or leg swelling -f/u with cardiology -medication assistance

## 2022-08-11 NOTE — Assessment & Plan Note (Signed)
Received flu shot at [facility] on 04/15/2022. Updated HM tab to reflect this.  Advised patient to get Shingrix vaccine at any preferred pharmacy.

## 2022-08-11 NOTE — Assessment & Plan Note (Signed)
Chronic and stable. Continues on lipitor and ASA for secondary prevention. Checking lipid panel today as last LDL was 129 in 02/2021. Goal LDL <70.  Plan: -continue lipitor and ASA -f/u lipid panel

## 2022-08-11 NOTE — Progress Notes (Signed)
   CC: medication refills  HPI:  [name] is a 62 y.o. male with history listed below presenting to the [facility] for medication refill. Please see individualized problem based charting for full HPI.  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  Physical Exam:  Vitals:   08/11/22 1455 08/11/22 1519  BP: (!) 133/97 130/80  Pulse: 98 90  Temp: 98.5 F (36.9 C)   TempSrc: Oral   SpO2: 99%   Weight: 147 lb 3.2 oz (66.8 kg)   Height: 6' (1.829 m)    Physical Exam Constitutional:      Appearance: Normal appearance. He is not ill-appearing.  HENT:     Mouth/Throat:     Mouth: Mucous membranes are moist.     Pharynx: Oropharynx is clear.  Eyes:     Extraocular Movements: Extraocular movements intact.     Conjunctiva/sclera: Conjunctivae normal.     Pupils: Pupils are equal, round, and reactive to light.  Cardiovascular:     Rate and Rhythm: Normal rate and regular rhythm.     Heart sounds: Normal heart sounds. No murmur heard.    No gallop.  Pulmonary:     Effort: Pulmonary effort is normal.     Breath sounds: Normal breath sounds. No wheezing, rhonchi or rales.  Abdominal:     General: Bowel sounds are normal. There is no distension.     Palpations: Abdomen is soft.     Tenderness: There is no abdominal tenderness.  Musculoskeletal:        General: No swelling. Normal range of motion.   Skin:    General: Skin is warm and dry.  Neurological:     General: No focal deficit present.     Mental Status: He is alert and oriented to person, place, and time.  Psychiatric:        Mood and Affect: Mood normal.        Behavior: Behavior normal.      Assessment & Plan:   See Encounters Tab for problem based charting.  Patient discussed with Dr.  Saverio Danker

## 2022-08-12 ENCOUNTER — Other Ambulatory Visit (HOSPITAL_COMMUNITY): Payer: Self-pay

## 2022-08-12 LAB — LIPID PANEL
Chol/HDL Ratio: 3.9 ratio (ref 0.0–5.0)
Cholesterol, Total: 158 mg/dL (ref 100–199)
HDL: 41 mg/dL (ref >39)
LDL Chol Calc (NIH): 104 mg/dL — ABNORMAL HIGH (ref 0–99)
Triglycerides: 66 mg/dL (ref 0–149)
VLDL Cholesterol Cal: 13 mg/dL (ref 5–40)

## 2022-08-12 NOTE — Addendum Note (Signed)
Addended by: Charise Killian on: 08/12/2022 01:38 PM   Modules accepted: Level of Service

## 2022-08-12 NOTE — Progress Notes (Signed)
Internal Medicine Clinic Attending  Case discussed with Dr. Jinwala  At the time of the visit.  We reviewed the resident's history and exam and pertinent patient test results.  I agree with the assessment, diagnosis, and plan of care documented in the resident's note.  

## 2022-08-17 ENCOUNTER — Other Ambulatory Visit: Payer: Self-pay | Admitting: Student

## 2022-08-17 DIAGNOSIS — Z8673 Personal history of transient ischemic attack (TIA), and cerebral infarction without residual deficits: Secondary | ICD-10-CM

## 2022-08-17 DIAGNOSIS — E7849 Other hyperlipidemia: Secondary | ICD-10-CM

## 2022-08-17 MED ORDER — ATORVASTATIN CALCIUM 80 MG PO TABS: 80.0000 mg | ORAL_TABLET | Freq: Every day | ORAL | 3 refills | Status: DC

## 2022-08-17 NOTE — Assessment & Plan Note (Signed)
LDL 104 on recent lipid panel. Discussed findings with patient and plan to increase lipitor to '80mg'$  daily.  -continue ASA -increased lipitor to '80mg'$  daily

## 2022-08-17 NOTE — Assessment & Plan Note (Signed)
Increased lipitor to '80mg'$  daily for secondary prevention given history of TIA. LDL was 104 while on lipitor '40mg'$  daily.  -lipitor '80mg'$  daily

## 2022-10-10 DIAGNOSIS — Z419 Encounter for procedure for purposes other than remedying health state, unspecified: Secondary | ICD-10-CM | POA: Diagnosis not present

## 2022-10-17 IMAGING — CT CT ANGIO CHEST-ABD-PELV FOR DISSECTION W/ AND WO/W CM
2 of 7 series · 11 of 46 positions shown, 12 images · IV contrast (OMNI)
Comparison: None.

CLINICAL DATA: Chest and abdominal pain

EXAM:
CT ANGIOGRAPHY CHEST, ABDOMEN AND PELVIS
TECHNIQUE: Non-contrast CT of the chest was initially obtained.

[Series 6: dissection 3.0 i30f 3 · axial · 0.59mm/px · z∈[+957,+1509]mm · 8 of 225 slices shown, 9 images]
[im 27/225  soft-tissue]
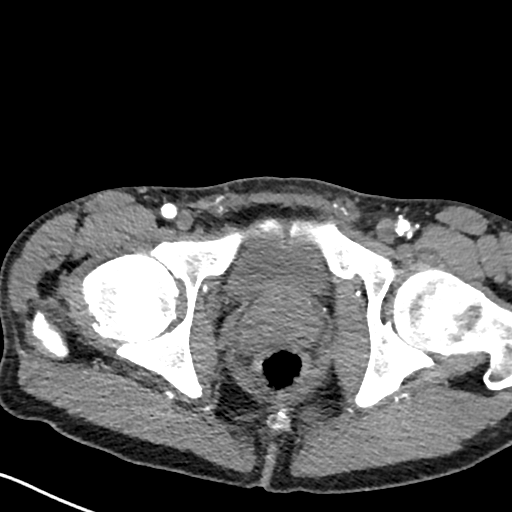
[im 27/225  bone]
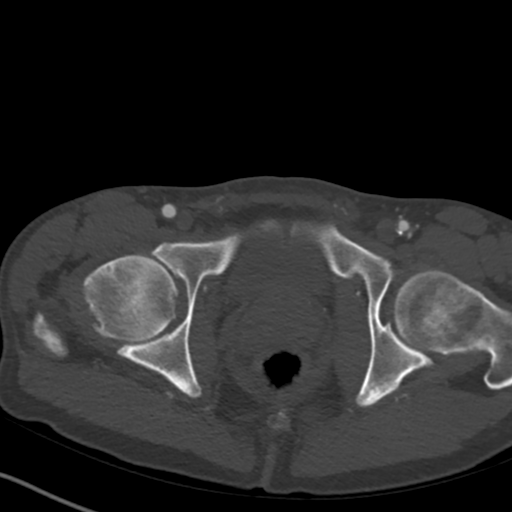
[im 53/225  soft-tissue]
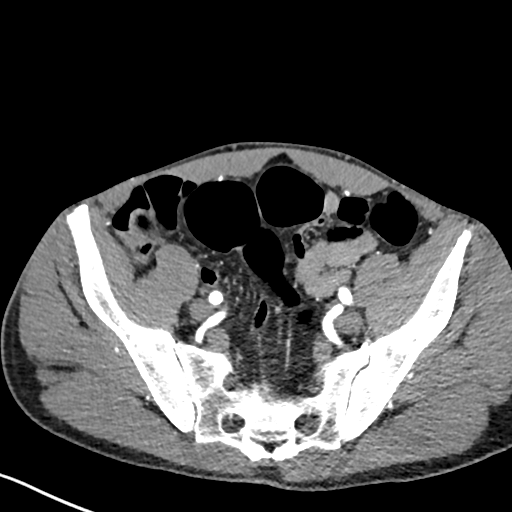
[im 80/225  soft-tissue]
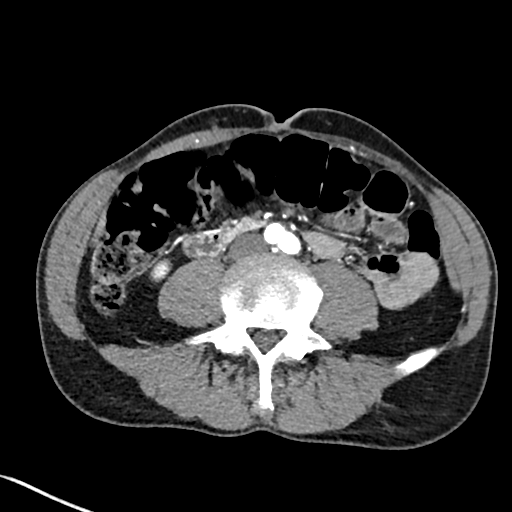
[im 106/225  soft-tissue]
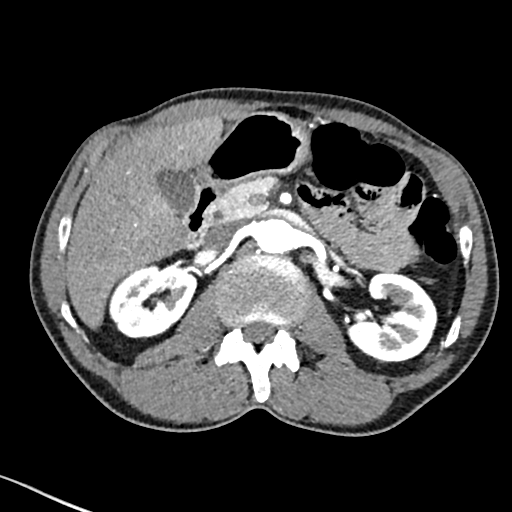
[im 132/225  soft-tissue]
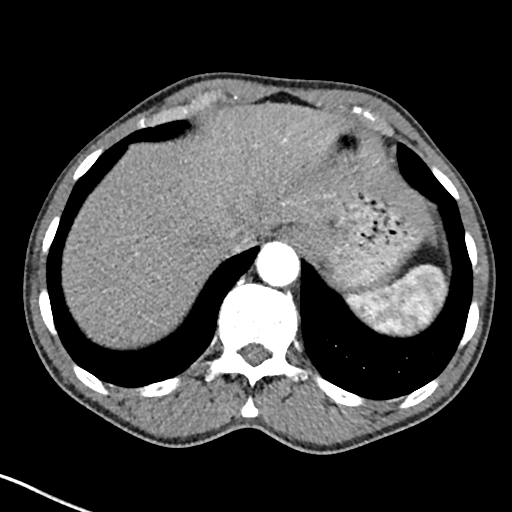
[im 159/225  soft-tissue]
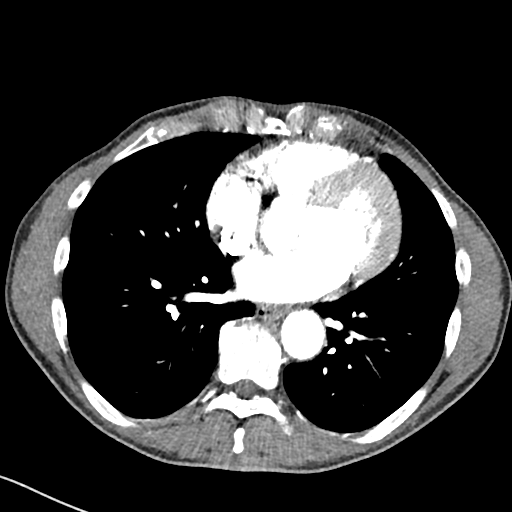
[im 185/225  soft-tissue]
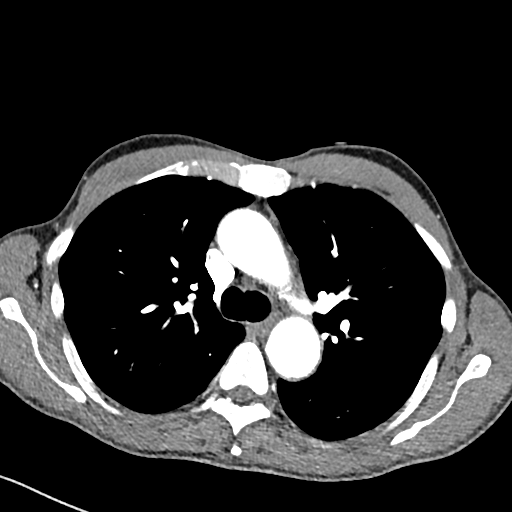
[im 211/225  soft-tissue]
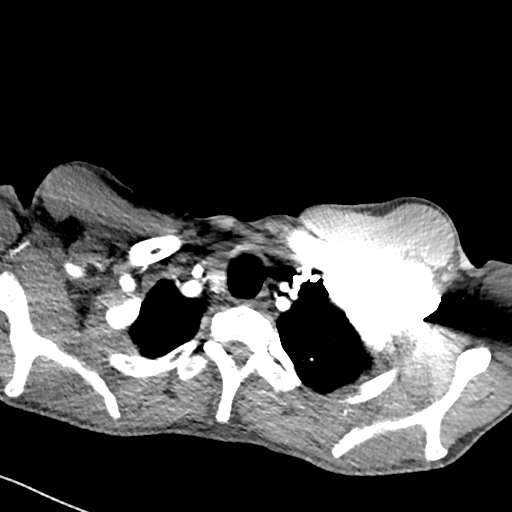

[Series 9: coronals · coronal · 0.65mm/px · 3 of 151 slices shown]
[im 38/151  soft-tissue]
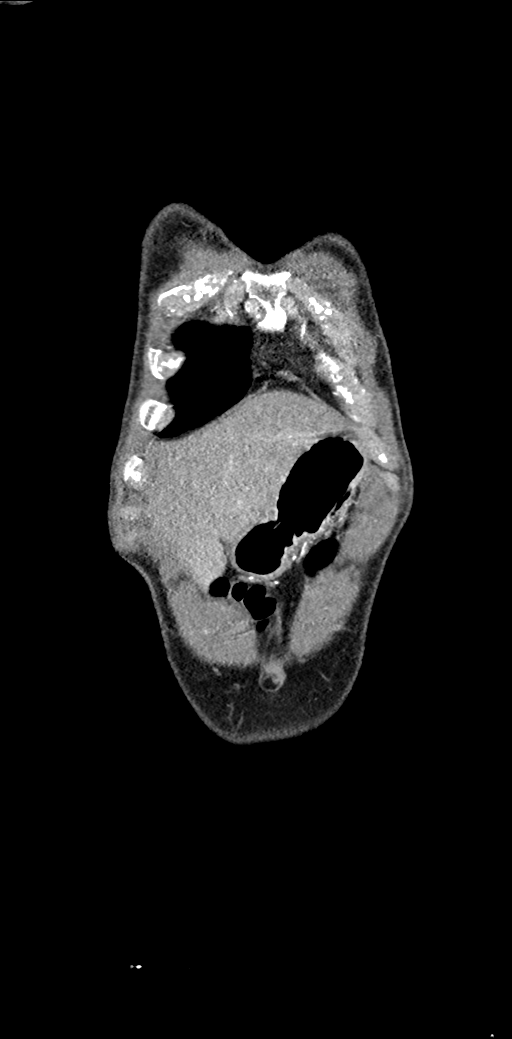
[im 76/151  soft-tissue]
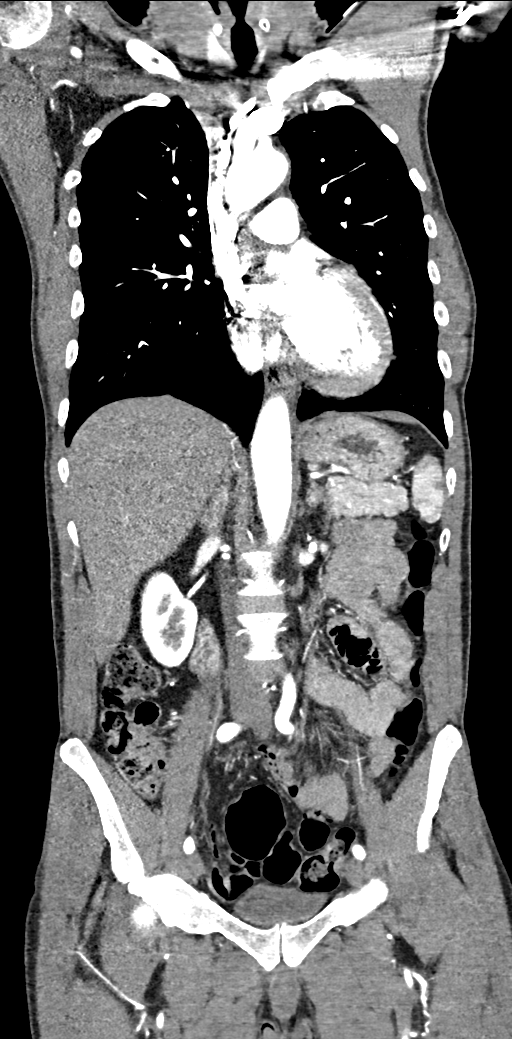
[im 113/151  soft-tissue]
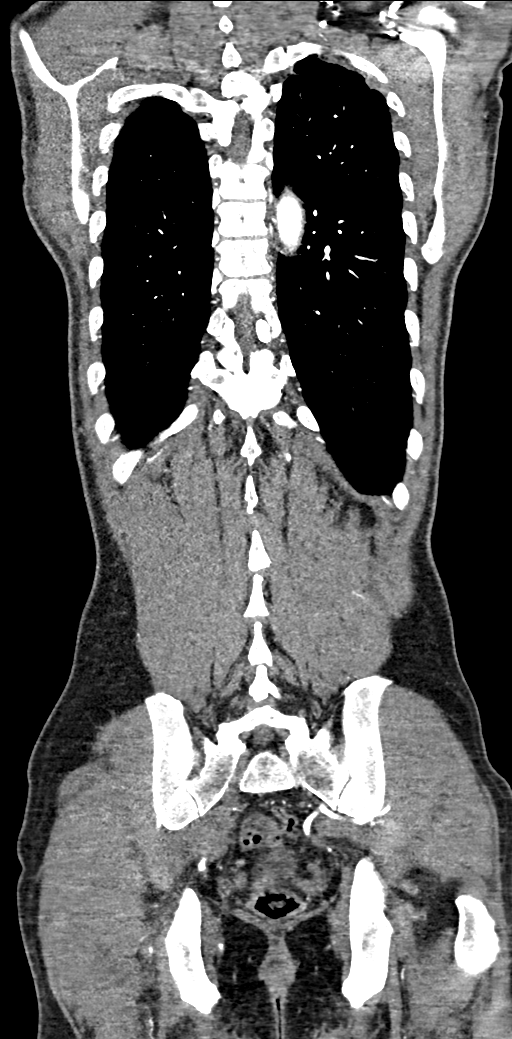

[11 of 46 positions shown; findings below may reference images not displayed]

Multidetector CT imaging through the chest, abdomen and pelvis was
performed using the standard protocol during bolus administration of
intravenous contrast. Multiplanar reconstructed images and MIPs were
obtained and reviewed to evaluate the vascular anatomy.

CONTRAST:  75mL OMNIPAQUE IOHEXOL 350 MG/ML SOLN
FINDINGS: CTA CHEST FINDINGS

Cardiovascular: There is no intramural hematoma evident on
noncontrast enhanced study. There is no demonstrable thoracic aortic
aneurysm or dissection. There is no evident mediastinal hematoma.
Visualized great vessels appear unremarkable without aneurysm or
dissection. There is no demonstrable pulmonary embolus. There is no
pericardial effusion or pericardial thickening. There are foci of
aortic atherosclerosis and foci of coronary artery calcification.

Mediastinum/Nodes: Thyroid appears normal. There is no evident
thoracic adenopathy. No esophageal lesions are appreciable.

Lungs/Pleura: There is no appreciable edema or consolidation. No
pleural effusions are evident. There is a tiny calcified granuloma
in the anterior segment of the right upper lobe. There is a 3 mm
nodular opacity in the apical segment of the left upper lobe seen on
axial slice 18 series 8. There is a 3 mm nodular opacity more
laterally in the apical segment of the left upper lobe seen on axial
slice 17 series 8.

Musculoskeletal: There is no pancreatic mass or inflammatory focus.

Review of the MIP images confirms the above findings.

CTA ABDOMEN AND PELVIS FINDINGS

VASCULAR

Aorta: Minimal atherosclerotic calcification in the distal aorta
without appreciable obstruction. No aneurysm or dissection involving
the aorta.

Celiac: Celiac artery and its branches are widely patent. No evident
aneurysm or dissection involving these vessels.

SMA: Superior mesenteric artery and its branches are widely patent.
No evident aneurysm or dissection involving these vessels.

Renals: There is a single renal artery on each side. Renal arteries
and their branches are widely patent. No fibromuscular dysplasia. No
aneurysm or dissection involving these vessels.

IMA: Inferior vena cava and its branches are widely patent. No
aneurysm or dissection involving these vessels.

Inflow: There are calcifications in the left and right internal
iliac arteries without hemodynamically significant obstruction.
There is minimal calcification in the right common iliac artery
without hemodynamically significant obstruction. Other pelvic
arterial vessels are widely patent. No aneurysm or dissection
involving these vessels. Profundus femoral and superficial femoral
arteries which are visualized are widely patent.

Veins: No obvious venous abnormality within the limitations of this
arterial phase study.

Review of the MIP images confirms the above findings.

NON-VASCULAR

Hepatobiliary: No evident focal liver lesions. Gallbladder wall is
not appreciably thickened. There is no biliary duct dilatation.

Pancreas: No appreciable pancreatic mass or inflammatory focus.

Spleen: No appreciable splenic lesions.

Adrenals/Urinary Tract: Adrenals bilaterally appear unremarkable.
There is a cyst in the lower pole of the left kidney measuring 1 x 1
cm. No evident hydronephrosis on either side. No renal or ureteral
calculus on either side. Urinary bladder midline with wall thickness
within normal limits.

Stomach/Bowel: There is moderate stool in the colon. There is no
appreciable bowel wall or mesenteric thickening. No evident bowel
obstruction. Terminal ileum appears normal. No demonstrable free air
or portal venous air.

Lymphatic: No evident adenopathy in the abdomen or pelvis.

Reproductive: Prostate and seminal vesicles are normal in size and
contour.

Other: No periappendiceal region inflammatory change. No abscess or
ascites evident in the abdomen or pelvis.

Musculoskeletal: Foci of degenerative change noted in the lower
lumbar spine. There are no blastic or lytic bone lesions. There is
no intramuscular or abdominal wall lesion.

Review of the MIP images confirms the above findings.
IMPRESSION: CT angiogram chest:

1. No thoracic aortic aneurysm or dissection. No mediastinal
hematoma. Great vessels appear normal. There are foci of aortic
atherosclerosis and foci of coronary artery calcification.

2.  No demonstrable pulmonary embolus.

3. No edema or airspace opacity. No pleural effusions. 3 mm nodular
opacities toward the apex on the left. Tiny granuloma right upper
lobe, calcified. No follow-up needed if patient is low-risk (and has
no known or suspected primary neoplasm). Non-contrast chest CT can
be considered in 12 months if patient is high-risk. This
recommendation follows the consensus statement: Guidelines for
Management of Incidental Pulmonary Nodules Detected on CT Images:

4.  No evident thoracic adenopathy.

CT angiogram abdomen; CT angiogram pelvis:

1. Areas of slight atherosclerotic plaque without hemodynamically
significant obstruction. No aneurysm or dissection in the aorta,
major mesenteric, and major pelvic arterial vessels. No
fibromuscular dysplasia.

2. No bowel wall thickening or bowel obstruction. No abscess in the
abdomen or pelvis.

3. No renal or ureteral calculus. No hydronephrosis. Urinary bladder
wall thickness normal.

Aortic Atherosclerosis ([id number]).

## 2022-11-07 ENCOUNTER — Ambulatory Visit (INDEPENDENT_AMBULATORY_CARE_PROVIDER_SITE_OTHER): Payer: BC Managed Care – PPO | Admitting: Student

## 2022-11-07 VITALS — BP 163/100 | HR 66 | Temp 98.4°F | Ht 72.0 in | Wt 146.9 lb

## 2022-11-07 DIAGNOSIS — Z87891 Personal history of nicotine dependence: Secondary | ICD-10-CM

## 2022-11-07 DIAGNOSIS — I429 Cardiomyopathy, unspecified: Secondary | ICD-10-CM | POA: Diagnosis not present

## 2022-11-07 DIAGNOSIS — I10 Essential (primary) hypertension: Secondary | ICD-10-CM | POA: Diagnosis not present

## 2022-11-07 DIAGNOSIS — I5021 Acute systolic (congestive) heart failure: Secondary | ICD-10-CM

## 2022-11-07 DIAGNOSIS — I110 Hypertensive heart disease with heart failure: Secondary | ICD-10-CM | POA: Diagnosis not present

## 2022-11-07 DIAGNOSIS — I5022 Chronic systolic (congestive) heart failure: Secondary | ICD-10-CM

## 2022-11-07 DIAGNOSIS — Z8673 Personal history of transient ischemic attack (TIA), and cerebral infarction without residual deficits: Secondary | ICD-10-CM

## 2022-11-07 DIAGNOSIS — Z720 Tobacco use: Secondary | ICD-10-CM

## 2022-11-07 MED ORDER — VARENICLINE TARTRATE 1 MG PO TABS: 1.0000 mg | ORAL_TABLET | Freq: Two times a day (BID) | ORAL | 5 refills | Status: DC

## 2022-11-07 MED ORDER — SPIRONOLACTONE 25 MG PO TABS: 12.5000 mg | ORAL_TABLET | Freq: Every day | ORAL | 2 refills | Status: DC

## 2022-11-07 MED ORDER — BUMETANIDE 1 MG PO TABS: ORAL_TABLET | ORAL | 0 refills | Status: DC

## 2022-11-07 MED ORDER — DAPAGLIFLOZIN PROPANEDIOL 10 MG PO TABS: ORAL_TABLET | ORAL | 3 refills | Status: DC

## 2022-11-07 MED ORDER — ENTRESTO 49-51 MG PO TABS: 1.0000 | ORAL_TABLET | Freq: Two times a day (BID) | ORAL | 11 refills | Status: DC

## 2022-11-07 MED ORDER — CARVEDILOL 6.25 MG PO TABS: 6.2500 mg | ORAL_TABLET | Freq: Two times a day (BID) | ORAL | 3 refills | Status: DC

## 2022-11-07 NOTE — Patient Instructions (Signed)
Sean Leblanc,  It was a pleasure seeing you in the clinic today.   Your blood pressure is high today. I have refilled your medications so please pick these up and take these as prescribed. Please show them your Medicaid card so that they are covered for you. We are checking some blood work today. I will call you with the results. Please come back to see [place] in 2 months.  Please call our clinic at [phone] if you have any questions or concerns. The best time to call is Monday-Friday from 9am-4pm, but there is someone available 24/7 at the same number. If you need medication refills, please notify your pharmacy one week in advance and they will send [place] a request.   Thank you for letting us take part in your care. We look forward to seeing you next time!

## 2022-11-07 NOTE — Assessment & Plan Note (Signed)
He continues on chantix therapy. Is currently smoking about 5-6 cigarettes per week. Still has not yet decided on a quit date but is working towards it. He would like refills on chantix today, which I provided.  Plan: -continue chantix 1mg  BID -continue counseling at future visits

## 2022-11-07 NOTE — Assessment & Plan Note (Signed)
Patient living with chronic HFrEF. He is fairly euvolemic today, only with trace pitting edema in bilateral lower extremities up to ankles. GDMT consists of entresto, spiro, coreg, and farxiga. Also on bumex prn. Needs refills on these medications. He was previously not fully adherent given these were cost-prohibitive but he is now approved for full Medicaid and thus medications will be covered. Have refilled them today.  Plan: -continue current regimen (refilled today)

## 2022-11-07 NOTE — Assessment & Plan Note (Signed)
BP elevated in clinic today, 177/93 with repeat BP 163/100. He is on coreg 6.25mg  BID, farxiga 10mg  daily, spiro 12.5mg  daily, and entresto 49-51mg  BID given concomitant HFrEF. He has ran out of his coreg and spiro for the past 4-5 days. Have refilled all of his medications for him today. No acute changes made to medication regimen given previously well controlled on current regimen. Checking BMP today to ensure stable kidney function and electrolytes (last one about a year ago).  Plan: -continue coreg, spironolactone, entresto, farxiga (refilled all today) -f/u in 2 months for repeat BP -f/u BMP

## 2022-11-07 NOTE — Assessment & Plan Note (Signed)
Increased lipitor to 80mg  daily a couple of months ago after LDL 104. Will plan to repeat lipid panel at next visit in 2 months.

## 2022-11-07 NOTE — Progress Notes (Signed)
   CC: f/u HTN, HFrEF  HPI:  [name] is a 62 y.o. male with history listed below presenting to the [facility] for f/u HTN, HFrEF. Please see individualized problem based charting for full HPI.  Past Medical History:  Diagnosis Date   Abnormal EKG    Anginal equivalent    Atherosclerosis of aorta (HCC)    Cardiomyopathy (HCC)    CHF (congestive heart failure) (HCC)    Continuous dependence on cigarette smoking    Coronary artery spasm (HCC) 02/26/2021   Coronary atherosclerosis due to calcified coronary lesion    Dyspnea on exertion    Hyperlipidemia    Hypertension    Hypertensive urgency 03/04/2016   Inclusion cyst 07/08/2020   Nonobstructive atherosclerosis of coronary artery    TIA (transient ischemic attack)     Review of Systems:  Negative aside from that listed in individualized problem based charting.  Physical Exam:  Vitals:   11/07/22 1610 11/07/22 1654  BP: (!) 177/93 (!) 163/100  Pulse: 70 66  Temp: 98.4 F (36.9 C)   TempSrc: Oral   SpO2: 100%   Weight: 146 lb 14.4 oz (66.6 kg)   Height: 6' (1.829 m)    Physical Exam Constitutional:      Appearance: Normal appearance. He is not ill-appearing.     Comments: thin  HENT:     Mouth/Throat:     Mouth: Mucous membranes are moist.     Pharynx: Oropharynx is clear. No oropharyngeal exudate.  Eyes:     General: No scleral icterus.    Extraocular Movements: Extraocular movements intact.     Conjunctiva/sclera: Conjunctivae normal.     Pupils: Pupils are equal, round, and reactive to light.  Cardiovascular:     Rate and Rhythm: Normal rate and regular rhythm.     Heart sounds: Normal heart sounds. No murmur heard.    No friction rub. No gallop.  Pulmonary:     Effort: Pulmonary effort is normal.     Breath sounds: Normal breath sounds. No wheezing, rhonchi or rales.  Abdominal:     General: Bowel sounds are normal. There is no distension.     Palpations: Abdomen is soft.     Tenderness: There is no  abdominal tenderness.  Musculoskeletal:        General: Normal range of motion.     Comments: Trace pitting edema in bilateral lower extremities up to ankles  Skin:    General: Skin is warm and dry.  Neurological:     General: No focal deficit present.     Mental Status: He is alert and oriented to person, place, and time.  Psychiatric:        Mood and Affect: Mood normal.        Behavior: Behavior normal.      Assessment & Plan:   See Encounters Tab for problem based charting.  Patient discussed with Dr.  Lafonda Mosses

## 2022-11-08 ENCOUNTER — Telehealth: Payer: Self-pay

## 2022-11-08 LAB — BMP8+ANION GAP
Anion Gap: 18 mmol/L (ref 10.0–18.0)
BUN/Creatinine Ratio: 14 (ref 10–24)
BUN: 11 mg/dL (ref 8–27)
CO2: 19 mmol/L — ABNORMAL LOW (ref 20–29)
Calcium: 10 mg/dL (ref 8.6–10.2)
Chloride: 103 mmol/L (ref 96–106)
Creatinine, Ser: 0.81 mg/dL (ref 0.76–1.27)
Glucose: 76 mg/dL (ref 70–99)
Potassium: 4 mmol/L (ref 3.5–5.2)
Sodium: 140 mmol/L (ref 134–144)
eGFR: 100 mL/min/{1.73_m2} (ref >59)

## 2022-11-08 NOTE — Progress Notes (Signed)
Internal Medicine Clinic Attending  Case discussed with Dr. Jinwala  At the time of the visit.  We reviewed the resident's history and exam and pertinent patient test results.  I agree with the assessment, diagnosis, and plan of care documented in the resident's note.  

## 2022-11-08 NOTE — Addendum Note (Signed)
Addended by: Derrek Monaco on: 11/08/2022 09:52 AM   Modules accepted: Level of Service

## 2022-11-08 NOTE — Progress Notes (Signed)
Patient called.  Left message for patient to call back. BMP unremarkable. Mild metabolic acidosis noted but this appears improved from prior.

## 2022-11-08 NOTE — Telephone Encounter (Signed)
Decision:Approved Darrol Jump (Key: [id number]) PA Case ID #: [id number] Need Help? Call us at [phone] Outcome Approved today Approved. This drug has been approved. Approved quantity: 90 tablets per 90 day(s). You may fill up to a 34 day supply at a retail pharmacy. You may fill up to a 90 day supply for maintenance drugs, please refer to the formulary for details. Please call the pharmacy to process your prescription claim. Authorization Expiration Date: 11/08/2023 Drug Farxiga 10MG  tablets ePA cloud logo Form [facility] Medicaid of [facility] Electronic Prior Authorization Request Form [phone] NCPDP)   Approval has been faxed to the pharmacy

## 2022-11-08 NOTE — Telephone Encounter (Signed)
Prior Authorization for patient (Farxiga) came through on cover my meds was submitted with last office notes awaiting approval or denial 

## 2022-11-09 ENCOUNTER — Other Ambulatory Visit (HOSPITAL_COMMUNITY): Payer: Self-pay

## 2022-11-09 DIAGNOSIS — Z419 Encounter for procedure for purposes other than remedying health state, unspecified: Secondary | ICD-10-CM | POA: Diagnosis not present

## 2022-11-10 ENCOUNTER — Other Ambulatory Visit (HOSPITAL_COMMUNITY): Payer: Self-pay

## 2022-11-11 ENCOUNTER — Other Ambulatory Visit (HOSPITAL_COMMUNITY): Payer: Self-pay

## 2022-11-18 ENCOUNTER — Other Ambulatory Visit: Payer: Self-pay

## 2022-11-18 ENCOUNTER — Observation Stay (HOSPITAL_COMMUNITY)
Admission: EM | Admit: 2022-11-18 | Discharge: 2022-11-19 | Disposition: A | Discharge status: Home / Self Care | Payer: BC Managed Care – PPO | Attending: Internal Medicine | Admitting: Internal Medicine

## 2022-11-18 ENCOUNTER — Emergency Department (HOSPITAL_COMMUNITY): Payer: BC Managed Care – PPO

## 2022-11-18 ENCOUNTER — Emergency Department (HOSPITAL_BASED_OUTPATIENT_CLINIC_OR_DEPARTMENT_OTHER): Payer: BC Managed Care – PPO

## 2022-11-18 ENCOUNTER — Encounter (HOSPITAL_COMMUNITY): Payer: Self-pay

## 2022-11-18 DIAGNOSIS — Z87891 Personal history of nicotine dependence: Secondary | ICD-10-CM | POA: Insufficient documentation

## 2022-11-18 DIAGNOSIS — N179 Acute kidney failure, unspecified: Secondary | ICD-10-CM | POA: Diagnosis not present

## 2022-11-18 DIAGNOSIS — F1491 Cocaine use, unspecified, in remission: Secondary | ICD-10-CM | POA: Diagnosis present

## 2022-11-18 DIAGNOSIS — I2510 Atherosclerotic heart disease of native coronary artery without angina pectoris: Secondary | ICD-10-CM | POA: Diagnosis not present

## 2022-11-18 DIAGNOSIS — I447 Left bundle-branch block, unspecified: Secondary | ICD-10-CM | POA: Diagnosis not present

## 2022-11-18 DIAGNOSIS — I951 Orthostatic hypotension: Principal | ICD-10-CM | POA: Diagnosis present

## 2022-11-18 DIAGNOSIS — Z91199 Patient's noncompliance with other medical treatment and regimen due to unspecified reason: Secondary | ICD-10-CM

## 2022-11-18 DIAGNOSIS — R918 Other nonspecific abnormal finding of lung field: Secondary | ICD-10-CM | POA: Diagnosis not present

## 2022-11-18 DIAGNOSIS — Z8673 Personal history of transient ischemic attack (TIA), and cerebral infarction without residual deficits: Secondary | ICD-10-CM | POA: Insufficient documentation

## 2022-11-18 DIAGNOSIS — I5022 Chronic systolic (congestive) heart failure: Secondary | ICD-10-CM | POA: Diagnosis not present

## 2022-11-18 DIAGNOSIS — E785 Hyperlipidemia, unspecified: Secondary | ICD-10-CM | POA: Diagnosis present

## 2022-11-18 DIAGNOSIS — Z7982 Long term (current) use of aspirin: Secondary | ICD-10-CM | POA: Diagnosis not present

## 2022-11-18 DIAGNOSIS — R42 Dizziness and giddiness: Secondary | ICD-10-CM | POA: Diagnosis not present

## 2022-11-18 DIAGNOSIS — F1490 Cocaine use, unspecified, uncomplicated: Secondary | ICD-10-CM

## 2022-11-18 DIAGNOSIS — I5020 Unspecified systolic (congestive) heart failure: Secondary | ICD-10-CM | POA: Diagnosis not present

## 2022-11-18 DIAGNOSIS — I428 Other cardiomyopathies: Secondary | ICD-10-CM | POA: Diagnosis not present

## 2022-11-18 DIAGNOSIS — E860 Dehydration: Secondary | ICD-10-CM | POA: Diagnosis not present

## 2022-11-18 DIAGNOSIS — I10 Essential (primary) hypertension: Secondary | ICD-10-CM | POA: Insufficient documentation

## 2022-11-18 DIAGNOSIS — R231 Pallor: Secondary | ICD-10-CM | POA: Diagnosis not present

## 2022-11-18 DIAGNOSIS — F1410 Cocaine abuse, uncomplicated: Secondary | ICD-10-CM | POA: Diagnosis present

## 2022-11-18 DIAGNOSIS — R55 Syncope and collapse: Secondary | ICD-10-CM | POA: Diagnosis not present

## 2022-11-18 DIAGNOSIS — R9431 Abnormal electrocardiogram [ECG] [EKG]: Secondary | ICD-10-CM | POA: Diagnosis not present

## 2022-11-18 DIAGNOSIS — Z79899 Other long term (current) drug therapy: Secondary | ICD-10-CM | POA: Diagnosis not present

## 2022-11-18 DIAGNOSIS — I110 Hypertensive heart disease with heart failure: Secondary | ICD-10-CM | POA: Diagnosis not present

## 2022-11-18 DIAGNOSIS — I5021 Acute systolic (congestive) heart failure: Secondary | ICD-10-CM

## 2022-11-18 DIAGNOSIS — F1010 Alcohol abuse, uncomplicated: Secondary | ICD-10-CM

## 2022-11-18 DIAGNOSIS — F1290 Cannabis use, unspecified, uncomplicated: Secondary | ICD-10-CM

## 2022-11-18 HISTORY — DX: Orthostatic hypotension: I95.1

## 2022-11-18 LAB — CBC WITH DIFFERENTIAL/PLATELET
Abs Immature Granulocytes: 0.06 10*3/uL (ref 0.00–0.07)
Basophils Absolute: 0.1 10*3/uL (ref 0.0–0.1)
Basophils Relative: 1 %
Eosinophils Absolute: 0.2 10*3/uL (ref 0.0–0.5)
Eosinophils Relative: 2 %
HCT: 48.5 % (ref 39.0–52.0)
Hemoglobin: 15.3 g/dL (ref 13.0–17.0)
Immature Granulocytes: 1 %
Lymphocytes Relative: 23 %
Lymphs Abs: 2.2 10*3/uL (ref 0.7–4.0)
MCH: 28.8 pg (ref 26.0–34.0)
MCHC: 31.5 g/dL (ref 30.0–36.0)
MCV: 91.2 fL (ref 80.0–100.0)
Monocytes Absolute: 1.1 10*3/uL — ABNORMAL HIGH (ref 0.1–1.0)
Monocytes Relative: 12 %
Neutro Abs: 5.7 10*3/uL (ref 1.7–7.7)
Neutrophils Relative %: 61 %
Platelets: 272 10*3/uL (ref 150–400)
RBC: 5.32 MIL/uL (ref 4.22–5.81)
RDW: 13.2 % (ref 11.5–15.5)
WBC: 9.3 10*3/uL (ref 4.0–10.5)
nRBC: 0 % (ref 0.0–0.2)

## 2022-11-18 LAB — COMPREHENSIVE METABOLIC PANEL
ALT: 24 U/L (ref 0–44)
AST: 27 U/L (ref 15–41)
Albumin: 4.4 g/dL (ref 3.5–5.0)
Alkaline Phosphatase: 55 U/L (ref 38–126)
Anion gap: 10 (ref 5–15)
BUN: 17 mg/dL (ref 8–23)
CO2: 24 mmol/L (ref 22–32)
Calcium: 9.4 mg/dL (ref 8.9–10.3)
Chloride: 100 mmol/L (ref 98–111)
Creatinine, Ser: 1.39 mg/dL — ABNORMAL HIGH (ref 0.61–1.24)
GFR, Estimated: 57 mL/min — ABNORMAL LOW (ref >60)
Glucose, Bld: 111 mg/dL — ABNORMAL HIGH (ref 70–99)
Potassium: 4.9 mmol/L (ref 3.5–5.1)
Sodium: 134 mmol/L — ABNORMAL LOW (ref 135–145)
Total Bilirubin: 1.7 mg/dL — ABNORMAL HIGH (ref 0.3–1.2)
Total Protein: 8.2 g/dL — ABNORMAL HIGH (ref 6.5–8.1)

## 2022-11-18 LAB — ECHOCARDIOGRAM COMPLETE
Area-P 1/2: 1.81 cm2
Height: 72 in
MV M vel: 3.07 m/s
MV Peak grad: 37.7 mmHg
S' Lateral: 3.9 cm
Weight: 2272 oz

## 2022-11-18 LAB — RAPID URINE DRUG SCREEN, HOSP PERFORMED
Amphetamines: NOT DETECTED
Barbiturates: NOT DETECTED
Benzodiazepines: NOT DETECTED
Cocaine: POSITIVE — AB
Opiates: NOT DETECTED
Tetrahydrocannabinol: POSITIVE — AB

## 2022-11-18 LAB — TSH: TSH: 1.188 u[IU]/mL (ref 0.350–4.500)

## 2022-11-18 LAB — TROPONIN I (HIGH SENSITIVITY)
Troponin I (High Sensitivity): 21 ng/L — ABNORMAL HIGH (ref <18)
Troponin I (High Sensitivity): 16 ng/L (ref <18)

## 2022-11-18 LAB — CBG MONITORING, ED: Glucose-Capillary: 109 mg/dL — ABNORMAL HIGH (ref 70–99)

## 2022-11-18 LAB — D-DIMER, QUANTITATIVE: D-Dimer, Quant: 0.64 ug/mL-FEU — ABNORMAL HIGH (ref 0.00–0.50)

## 2022-11-18 LAB — BRAIN NATRIURETIC PEPTIDE: B Natriuretic Peptide: 593.8 pg/mL — ABNORMAL HIGH (ref 0.0–100.0)

## 2022-11-18 MED ORDER — ATORVASTATIN CALCIUM 40 MG PO TABS
80.0000 mg | ORAL_TABLET | Freq: Every day | ORAL | Status: DC
  Administered 2022-11-19: 80 mg via ORAL
  Filled 2022-11-18: qty 2

## 2022-11-18 MED ORDER — TRAZODONE HCL 50 MG PO TABS: 25.0000 mg | ORAL_TABLET | Freq: Every evening | ORAL | Status: DC | PRN

## 2022-11-18 MED ORDER — SACUBITRIL-VALSARTAN 49-51 MG PO TABS
1.0000 | ORAL_TABLET | Freq: Two times a day (BID) | ORAL | Status: DC
  Administered 2022-11-18: 1 via ORAL
  Filled 2022-11-18 (×2): qty 1

## 2022-11-18 MED ORDER — SODIUM CHLORIDE 0.9 % IV SOLN: Freq: Once | INTRAVENOUS | Status: AC

## 2022-11-18 MED ORDER — ACETAMINOPHEN 650 MG RE SUPP: 650.0000 mg | Freq: Four times a day (QID) | RECTAL | Status: DC | PRN

## 2022-11-18 MED ORDER — SODIUM CHLORIDE 0.9 % IV BOLUS
500.0000 mL | Freq: Once | INTRAVENOUS | Status: AC
  Administered 2022-11-18: 500 mL via INTRAVENOUS

## 2022-11-18 MED ORDER — IOHEXOL 350 MG/ML SOLN
75.0000 mL | Freq: Once | INTRAVENOUS | Status: AC | PRN
  Administered 2022-11-18: 75 mL via INTRAVENOUS

## 2022-11-18 MED ORDER — CARVEDILOL 6.25 MG PO TABS
6.2500 mg | ORAL_TABLET | Freq: Two times a day (BID) | ORAL | Status: DC
  Administered 2022-11-18 – 2022-11-19 (×2): 6.25 mg via ORAL
  Filled 2022-11-18 (×2): qty 1

## 2022-11-18 MED ORDER — SODIUM CHLORIDE (PF) 0.9 % IJ SOLN
INTRAMUSCULAR | Status: AC
  Filled 2022-11-18: qty 50

## 2022-11-18 MED ORDER — PERFLUTREN LIPID MICROSPHERE
1.0000 mL | INTRAVENOUS | Status: AC | PRN
  Administered 2022-11-18: 2 mL via INTRAVENOUS

## 2022-11-18 MED ORDER — ONDANSETRON HCL 4 MG PO TABS: 4.0000 mg | ORAL_TABLET | Freq: Four times a day (QID) | ORAL | Status: DC | PRN

## 2022-11-18 MED ORDER — SODIUM CHLORIDE 0.9 % IV BOLUS: 1000.0000 mL | Freq: Once | INTRAVENOUS | Status: DC

## 2022-11-18 MED ORDER — ALBUTEROL SULFATE (2.5 MG/3ML) 0.083% IN NEBU: 2.5000 mg | INHALATION_SOLUTION | RESPIRATORY_TRACT | Status: DC | PRN

## 2022-11-18 MED ORDER — DAPAGLIFLOZIN PROPANEDIOL 10 MG PO TABS
10.0000 mg | ORAL_TABLET | Freq: Every day | ORAL | Status: DC
  Administered 2022-11-19: 10 mg via ORAL
  Filled 2022-11-18: qty 1

## 2022-11-18 MED ORDER — ACETAMINOPHEN 325 MG PO TABS: 650.0000 mg | ORAL_TABLET | Freq: Four times a day (QID) | ORAL | Status: DC | PRN

## 2022-11-18 MED ORDER — ENOXAPARIN SODIUM 40 MG/0.4ML IJ SOSY
40.0000 mg | PREFILLED_SYRINGE | INTRAMUSCULAR | Status: DC
  Administered 2022-11-18: 40 mg via SUBCUTANEOUS
  Filled 2022-11-18: qty 0.4

## 2022-11-18 MED ORDER — ONDANSETRON HCL 4 MG/2ML IJ SOLN: 4.0000 mg | Freq: Four times a day (QID) | INTRAMUSCULAR | Status: DC | PRN

## 2022-11-18 MED ORDER — ASPIRIN 81 MG PO TBEC
81.0000 mg | DELAYED_RELEASE_TABLET | Freq: Every day | ORAL | Status: DC
  Administered 2022-11-19: 81 mg via ORAL
  Filled 2022-11-18: qty 1

## 2022-11-18 NOTE — ED Notes (Signed)
Pt ambulatory to CT

## 2022-11-18 NOTE — ED Notes (Signed)
ED TO INPATIENT HANDOFF REPORT  ED Nurse Name and Phone #: Thamas Jaegers Name/Age/Gender Sean Leblanc 62 y.o. male Room/Bed: WA19/WA19  Code Status   Code Status: Full Code  Home/SNF/Other Home Patient oriented to: self, place, time, and situation Is this baseline? Yes   Triage Complete: Triage complete  Chief Complaint Orthostatic hypotension [I95.1]  Triage Note BIBA from work was hot and dizzy near syncopal episode, 97/60s sitting then went to 70/58 standing, no other complaints 20 LAC 500 NS CBG 133   Allergies No Known Allergies  Level of Care/Admitting Diagnosis ED Disposition     ED Disposition  Admit   Condition  --   Comment  Hospital Area: [facility] [[id number]]  Level of Care: Telemetry [5]  Admit to tele based on following criteria: Eval of Syncope  May place patient in observation at [facility] or [facility] if equivalent level of care is available:: Yes  Covid Evaluation: Asymptomatic - no recent exposure (last 10 days) testing not required  Diagnosis: Orthostatic hypotension [458.0.ICD-9-CM]  Admitting Physician: Maryln Gottron [[id number]]  Attending Physician: Kirby Crigler, MIR [email or web address] [[id number]]          B Medical/Surgery History Past Medical History:  Diagnosis Date   Abnormal EKG    Anginal equivalent    Atherosclerosis of aorta (HCC)    Cardiomyopathy (HCC)    CHF (congestive heart failure) (HCC)    Continuous dependence on cigarette smoking    Coronary artery spasm (HCC) 02/26/2021   Coronary atherosclerosis due to calcified coronary lesion    Dyspnea on exertion    Hyperlipidemia    Hypertension    Hypertensive urgency 03/04/2016   Inclusion cyst 07/08/2020   Nonobstructive atherosclerosis of coronary artery    TIA (transient ischemic attack)    Past Surgical History:  Procedure Laterality Date   LEFT HEART CATH AND CORONARY ANGIOGRAPHY N/A 02/26/2021   Procedure: LEFT HEART CATH AND CORONARY ANGIOGRAPHY;   Surgeon: Yates Decamp, MD;  Location: [facility] INVASIVE CV LAB;  Service: Cardiovascular;  Laterality: N/A;     A IV Location/Drains/Wounds Patient Lines/Drains/Airways Status     Active Line/Drains/Airways     Name Placement date Placement time Site Days   Peripheral IV 11/18/22 20 G Left;Upper Forearm 11/18/22  0659  Forearm  less than 1   Peripheral IV 11/18/22 20 G Right Antecubital 11/18/22  0700  Antecubital  less than 1            Intake/Output Last 24 hours  Intake/Output Summary (Last 24 hours) at 11/18/2022 1420 Last data filed at 11/18/2022 1252 Gross per 24 hour  Intake 1000 ml  Output --  Net 1000 ml    Labs/Imaging Results for orders placed or performed during the hospital encounter of 11/18/22 (from the past 48 hour(s))  Troponin I (High Sensitivity)     Status: Abnormal   Collection Time: 11/18/22  6:41 AM  Result Value Ref Range   Troponin I (High Sensitivity) 21 (H) <18 ng/L    Comment: (NOTE) Elevated high sensitivity troponin I (hsTnI) values and significant  changes across serial measurements may suggest ACS but many other  chronic and acute conditions are known to elevate hsTnI results.  Refer to the "Links" section for chest pain algorithms and additional  guidance. Performed at [facility], [address], [place], [place] [address]   Comprehensive metabolic panel     Status: Abnormal   Collection Time: 11/18/22  6:41 AM  Result Value Ref Range   Sodium 134 (L) 135 - 145 mmol/L   Potassium 4.9 3.5 - 5.1 mmol/L   Chloride 100 98 - 111 mmol/L   CO2 24 22 - 32 mmol/L   Glucose, Bld 111 (H) 70 - 99 mg/dL    Comment: Glucose reference range applies only to samples taken after fasting for at least 8 hours.   BUN 17 8 - 23 mg/dL   Creatinine, Ser 1.61 (H) 0.61 - 1.24 mg/dL   Calcium 9.4 8.9 - 09.6 mg/dL   Total Protein 8.2 (H) 6.5 - 8.1 g/dL   Albumin 4.4 3.5 - 5.0 g/dL   AST 27 15 - 41 U/L   ALT 24 0 - 44 U/L   Alkaline  Phosphatase 55 38 - 126 U/L   Total Bilirubin 1.7 (H) 0.3 - 1.2 mg/dL   GFR, Estimated 57 (L) >60 mL/min    Comment: (NOTE) Calculated using the CKD-EPI Creatinine Equation (2021)    Anion gap 10 5 - 15    Comment: Performed at [facility], [address], [place], [place] [address]  CBG monitoring, ED     Status: Abnormal   Collection Time: 11/18/22  6:55 AM  Result Value Ref Range   Glucose-Capillary 109 (H) 70 - 99 mg/dL    Comment: Glucose reference range applies only to samples taken after fasting for at least 8 hours.  Brain natriuretic peptide     Status: Abnormal   Collection Time: 11/18/22  6:56 AM  Result Value Ref Range   B Natriuretic Peptide 593.8 (H) 0.0 - 100.0 pg/mL    Comment: Performed at [facility], [address], [place], [place] [address]  CBC with Differential/Platelet     Status: Abnormal   Collection Time: 11/18/22  6:56 AM  Result Value Ref Range   WBC 9.3 4.0 - 10.5 K/uL   RBC 5.32 4.22 - 5.81 MIL/uL   Hemoglobin 15.3 13.0 - 17.0 g/dL   HCT 14.7 82.9 - 56.2 %   MCV 91.2 80.0 - 100.0 fL   MCH 28.8 26.0 - 34.0 pg   MCHC 31.5 30.0 - 36.0 g/dL   RDW 13.0 86.5 - 78.4 %   Platelets 272 150 - 400 K/uL   nRBC 0.0 0.0 - 0.2 %   Neutrophils Relative % 61 %   Neutro Abs 5.7 1.7 - 7.7 K/uL   Lymphocytes Relative 23 %   Lymphs Abs 2.2 0.7 - 4.0 K/uL   Monocytes Relative 12 %   Monocytes Absolute 1.1 (H) 0.1 - 1.0 K/uL   Eosinophils Relative 2 %   Eosinophils Absolute 0.2 0.0 - 0.5 K/uL   Basophils Relative 1 %   Basophils Absolute 0.1 0.0 - 0.1 K/uL   Immature Granulocytes 1 %   Abs Immature Granulocytes 0.06 0.00 - 0.07 K/uL    Comment: Performed at [facility], [address], [place], [place] [address]  Troponin I (High Sensitivity)     Status: None   Collection Time: 11/18/22 10:10 AM  Result Value Ref Range   Troponin I (High Sensitivity) 16 <18 ng/L    Comment: (NOTE) Elevated high  sensitivity troponin I (hsTnI) values and significant  changes across serial measurements may suggest ACS but many other  chronic and acute conditions are known to elevate hsTnI results.  Refer to the "Links" section for chest pain algorithms and additional  guidance. Performed at [facility], [address]  [place], [place] [address]   Rapid urine drug screen (hospital performed)     Status: Abnormal   Collection Time: 11/18/22 10:29 AM  Result Value Ref Range   Opiates NONE DETECTED NONE DETECTED   Cocaine POSITIVE (A) NONE DETECTED   Benzodiazepines NONE DETECTED NONE DETECTED   Amphetamines NONE DETECTED NONE DETECTED   Tetrahydrocannabinol POSITIVE (A) NONE DETECTED   Barbiturates NONE DETECTED NONE DETECTED    Comment: (NOTE) DRUG SCREEN FOR MEDICAL PURPOSES ONLY.  IF CONFIRMATION IS NEEDED FOR ANY PURPOSE, NOTIFY LAB WITHIN 5 DAYS.  LOWEST DETECTABLE LIMITS FOR URINE DRUG SCREEN Drug Class                     Cutoff (ng/mL) Amphetamine and metabolites    1000 Barbiturate and metabolites    200 Benzodiazepine                 200 Opiates and metabolites        300 Cocaine and metabolites        300 THC                            50 Performed at [facility] [place], [address], [place], [place] [address]   D-dimer, quantitative     Status: Abnormal   Collection Time: 11/18/22 10:39 AM  Result Value Ref Range   D-Dimer, Quant 0.64 (H) 0.00 - 0.50 ug/mL-FEU    Comment: (NOTE) At the manufacturer cut-off value of 0.5 g/mL FEU, this assay has a negative predictive value of 95-100%.This assay is intended for use in conjunction with a clinical pretest probability (PTP) assessment model to exclude pulmonary embolism (PE) and deep venous thrombosis (DVT) in outpatients suspected of PE or DVT. Results should be correlated with clinical presentation. Performed at [facility], [address] [place], [place]  [address]    CT Angio Chest PE W and/or Wo Contrast  Result Date: 11/18/2022 CLINICAL DATA:  PE suspected EXAM: CT ANGIOGRAPHY CHEST WITH CONTRAST TECHNIQUE: Multidetector CT imaging of the chest was performed using the standard protocol during bolus administration of intravenous contrast. Multiplanar CT image reconstructions and MIPs were obtained to evaluate the vascular anatomy. RADIATION DOSE REDUCTION: This exam was performed according to the departmental dose-optimization program which includes automated exposure control, adjustment of the mA and/or kV according to patient size and/or use of iterative reconstruction technique. CONTRAST:  75mL OMNIPAQUE IOHEXOL 350 MG/ML SOLN COMPARISON:  CT Chest 02/26/21 FINDINGS: Cardiovascular: Satisfactory opacification of the pulmonary arteries to the segmental level. No evidence of pulmonary embolism. Normal heart size. No pericardial effusion. Mediastinum/Nodes: No enlarged mediastinal, hilar, or axillary lymph nodes. Thyroid gland, trachea, and esophagus demonstrate no significant findings. Lungs/Pleura: No pleural effusion. No pneumothorax. No large consolidative opacity. There are a few scattered cystic lesions in the bilateral upper and lower lobes unchanged from prior exam, and favored to represent emphysematous change. 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 Musculoskeletal: No chest wall abnormality. No acute or significant osseous findings. Review of the MIP images confirms the above findings. IMPRESSION: No evidence of pulmonary embolism or other acute intrathoracic process. Electronically Signed   By: Lorenza Cambridge M.D.   On: 11/18/2022 13:18   DG Chest Portable 1 View  Result Date: 11/18/2022 CLINICAL DATA:  Dizziness.  Syncope. EXAM: PORTABLE CHEST 1 VIEW COMPARISON:  The visualized osseous structures are unremarkable. FINDINGS: Mild cardiac enlargement is  stable. Lung volumes are normal. No pleural effusion or edema. No  airspace opacities. Visualized osseous structures are unremarkable. IMPRESSION: No acute cardiopulmonary abnormalities. Electronically Signed   By: Signa Kell M.D.   On: 11/18/2022 07:07    Pending Labs Unresulted Labs (From admission, onward)     Start     Ordered   11/19/22 0500  Basic metabolic panel  Tomorrow morning,   R        11/18/22 1416   11/19/22 0500  CBC  Tomorrow morning,   R        11/18/22 1416   11/18/22 1416  HIV Antibody (routine testing w rflx)  (HIV Antibody (Routine testing w reflex) panel)  Once,   R        11/18/22 1416   11/18/22 1416  TSH  Once,   R        11/18/22 1416   11/18/22 0642  CBC with Differential/Platelet  Once,   STAT        11/18/22 0641            Vitals/Pain Today's Vitals   11/18/22 1200 11/18/22 1230 11/18/22 1231 11/18/22 1415  BP: 116/83 108/73  135/87  Pulse: 62 64  65  Resp: 15 20  18   Temp:   97.8 F (36.6 C)   TempSrc:      SpO2: 100% 100%  98%  Weight:      Height:        Isolation Precautions No active isolations  Medications Medications  aspirin EC tablet 81 mg (has no administration in time range)  atorvastatin (LIPITOR) tablet 80 mg (has no administration in time range)  carvedilol (COREG) tablet 6.25 mg (has no administration in time range)  sacubitril-valsartan (ENTRESTO) 49-51 mg per tablet (has no administration in time range)  dapagliflozin propanediol (FARXIGA) tablet 10 mg (has no administration in time range)  enoxaparin (LOVENOX) injection 40 mg (has no administration in time range)  acetaminophen (TYLENOL) tablet 650 mg (has no administration in time range)    Or  acetaminophen (TYLENOL) suppository 650 mg (has no administration in time range)  traZODone (DESYREL) tablet 25 mg (has no administration in time range)  ondansetron (ZOFRAN) tablet 4 mg (has no administration in time range)    Or  ondansetron (ZOFRAN) injection 4 mg (has no administration in time range)  albuterol (PROVENTIL) (2.5  MG/3ML) 0.083% nebulizer solution 2.5 mg (has no administration in time range)  sodium chloride 0.9 % bolus 500 mL (0 mLs Intravenous Stopped 11/18/22 1000)  0.9 %  sodium chloride infusion (0 mLs Intravenous Stopped 11/18/22 1252)  iohexol (OMNIPAQUE) 350 MG/ML injection 75 mL (75 mLs Intravenous Contrast Given 11/18/22 1246)    Mobility walks     Focused Assessments    R Recommendations: See Admitting Provider Note  Report given to:   Additional Notes:

## 2022-11-18 NOTE — H&P (Signed)
History and Physical  FINTON QUINCEY MVH:[id number] DOB: 12-03-1960 DOA: 11/18/2022  PCP: Merrilyn Puma, MD   Chief Complaint: Presyncope  HPI: Sean Leblanc is a 62 y.o. male with medical history significant for nonischemic cardiomyopathy EF 20%, hypertension, hyperlipidemia being admitted to the hospital with orthostatic hypotension and presyncope.  Patient states that he has been in his usual state of health, doing well, when he went to work early this morning at about 5 AM, almost passed out.  It is certainly positional related, he gets dizzy when he stands up.  Admits to not drinking nearly enough water as he should, knows that he is likely dehydrated.  Denies any chest pain, fevers, chills, vomiting, diarrhea.  He was given a total of 1500 cc normal saline in the emergency department, after which his orthostasis slightly improved however he did still feel slightly dizzy when he ambulated after fluids.  He was already seen by cardiology in the emergency department.  They have ordered 2D echo.  Hospitalist admission was requested for further evaluation and workup.  Review of Systems: Please see HPI for pertinent positives and negatives. A complete 10 system review of systems are otherwise negative.  Past Medical History:  Diagnosis Date   Abnormal EKG    Anginal equivalent    Atherosclerosis of aorta (HCC)    Cardiomyopathy (HCC)    CHF (congestive heart failure) (HCC)    Continuous dependence on cigarette smoking    Coronary artery spasm (HCC) 02/26/2021   Coronary atherosclerosis due to calcified coronary lesion    Dyspnea on exertion    Hyperlipidemia    Hypertension    Hypertensive urgency 03/04/2016   Inclusion cyst 07/08/2020   Nonobstructive atherosclerosis of coronary artery    TIA (transient ischemic attack)    Past Surgical History:  Procedure Laterality Date   LEFT HEART CATH AND CORONARY ANGIOGRAPHY N/A 02/26/2021   Procedure: LEFT HEART CATH AND CORONARY  ANGIOGRAPHY;  Surgeon: Yates Decamp, MD;  Location: [facility];  Service: Cardiovascular;  Laterality: N/A;    Social History:  reports that he has quit smoking. His smoking use included cigarettes. He has a 4.00 pack-year smoking history. He has never used smokeless tobacco. He reports that he does not currently use alcohol. He reports that he does not currently use drugs after having used the following drugs: Marijuana and Codeine.   No Known Allergies  Family History  Problem Relation Age of Onset   Heart attack Mother    Cancer Father      Prior to Admission medications   Medication Sig Start Date End Date Taking? Authorizing Provider  aspirin EC 81 MG tablet Take 1 tablet (81 mg total) by mouth daily. Swallow whole. 03/05/21  Yes Tolia, Sunit, DO  atorvastatin (LIPITOR) 80 MG tablet Take 1 tablet (80 mg total) by mouth daily. 08/17/22 08/17/23 Yes Merrilyn Puma, MD  bumetanide (BUMEX) 1 MG tablet Take 1 tablet (1mg ) by mouth daily AS NEEDED for weight gain >3 lbs or leg swelling. Patient taking differently: Take 0.5 mg by mouth daily as needed (Swelling, weight gain >3 lbs.). 11/07/22  Yes Merrilyn Puma, MD  carvedilol (COREG) 6.25 MG tablet Take 1 tablet (6.25 mg total) by mouth 2 (two) times daily. 11/07/22  Yes Merrilyn Puma, MD  dapagliflozin propanediol (FARXIGA) 10 MG TABS tablet TAKE 1 TABLET(10 MG) BY MOUTH DAILY Patient taking differently: Take 10 mg by mouth daily. 11/07/22  Yes Merrilyn Puma, MD  sacubitril-valsartan (ENTRESTO) 49-51 MG  Take 1 tablet by mouth 2 (two) times daily. 11/07/22  Yes Merrilyn Puma, MD  varenicline (CHANTIX) 1 MG tablet Take 1 tablet (1 mg total) by mouth 2 (two) times daily. 11/07/22  Yes Merrilyn Puma, MD  spironolactone (ALDACTONE) 25 MG tablet Take 0.5 tablets (12.5 mg total) by mouth daily. Patient not taking: Reported on 11/18/2022 11/07/22 11/02/23  Merrilyn Puma, MD    Physical Exam: BP 108/73   Pulse 64   Temp 97.8 F (36.6 C)    Resp 20   Ht 6' (1.829 m)   Wt 64.4 kg   SpO2 100%   BMI 19.26 kg/m   General:  Alert, oriented, calm, in no acute distress, thin well-nourished well-developed gentleman currently getting an echo in his room in the ER.  Pleasant and cooperative, looks very comfortable. Eyes: EOMI, clear conjuctivae, white sclerea Neck: supple, no masses, trachea mildline  Cardiovascular: RRR, no murmurs or rubs, no peripheral edema  Respiratory: clear to auscultation bilaterally, no wheezes, no crackles  Abdomen: soft, nontender, nondistended, normal bowel tones heard  Skin: dry, no rashes  Musculoskeletal: no joint effusions, normal range of motion  Psychiatric: appropriate affect, normal speech  Neurologic: extraocular muscles intact, clear speech, moving all extremities with intact sensorium          Labs on Admission:  Basic Metabolic Panel: Recent Labs  Lab 11/18/22 0641  NA 134*  K 4.9  CL 100  CO2 24  GLUCOSE 111*  BUN 17  CREATININE 1.39*  CALCIUM 9.4   Liver Function Tests: Recent Labs  Lab 11/18/22 0641  AST 27  ALT 24  ALKPHOS 55  BILITOT 1.7*  PROT 8.2*  ALBUMIN 4.4   No results for input(s): "LIPASE", "AMYLASE" in the last 168 hours. No results for input(s): "AMMONIA" in the last 168 hours. CBC: Recent Labs  Lab 11/18/22 0656  WBC 9.3  NEUTROABS 5.7  HGB 15.3  HCT 48.5  MCV 91.2  PLT 272   Cardiac Enzymes: No results for input(s): "CKTOTAL", "CKMB", "CKMBINDEX", "TROPONINI" in the last 168 hours.  BNP (last 3 results) Recent Labs    11/18/22 0656  BNP 593.8*    ProBNP (last 3 results) No results for input(s): "PROBNP" in the last 8760 hours.  CBG: Recent Labs  Lab 11/18/22 0655  GLUCAP 109*    Radiological Exams on Admission: CT Angio Chest PE W and/or Wo Contrast  Result Date: 11/18/2022 CLINICAL DATA:  PE suspected EXAM: CT ANGIOGRAPHY CHEST WITH CONTRAST TECHNIQUE: Multidetector CT imaging of the chest was performed using the standard  protocol during bolus administration of intravenous contrast. Multiplanar CT image reconstructions and MIPs were obtained to evaluate the vascular anatomy. RADIATION DOSE REDUCTION: This exam was performed according to the departmental dose-optimization program which includes automated exposure control, adjustment of the mA and/or kV according to patient size and/or use of iterative reconstruction technique. CONTRAST:  75mL OMNIPAQUE IOHEXOL 350 MG/ML SOLN COMPARISON:  CT Chest 02/26/21 FINDINGS: Cardiovascular: Satisfactory opacification of the pulmonary arteries to the segmental level. No evidence of pulmonary embolism. Normal heart size. No pericardial effusion. Mediastinum/Nodes: No enlarged mediastinal, hilar, or axillary lymph nodes. Thyroid gland, trachea, and esophagus demonstrate no significant findings. Lungs/Pleura: No pleural effusion. No pneumothorax. No large consolidative opacity. There are a few scattered cystic lesions in the bilateral upper and lower lobes unchanged from prior exam, and favored to represent emphysematous change. 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 Musculoskeletal: No chest wall abnormality. No acute or  significant osseous findings. Review of the MIP images confirms the above findings. IMPRESSION: No evidence of pulmonary embolism or other acute intrathoracic process. Electronically Signed   By: Lorenza Cambridge M.D.   On: 11/18/2022 13:18   DG Chest Portable 1 View  Result Date: 11/18/2022 CLINICAL DATA:  Dizziness.  Syncope. EXAM: PORTABLE CHEST 1 VIEW COMPARISON:  The visualized osseous structures are unremarkable. FINDINGS: Mild cardiac enlargement is stable. Lung volumes are normal. No pleural effusion or edema. No airspace opacities. Visualized osseous structures are unremarkable. IMPRESSION: No acute cardiopulmonary abnormalities. Electronically Signed   By: Signa Kell M.D.   On: 11/18/2022 07:07    Assessment/Plan This is a pleasant  62 year old gentleman with a history of cocaine abuse, nonischemic cardiomyopathy EF 20% in 2022 being admitted to the hospital with presyncope.    Orthostatic hypotension in the setting of known Chronic HFrEF (heart failure with reduced ejection fraction) (HCC)-most likely due to dehydration in the setting of antihypertensives and diuretics.  He has received a total of 1500 cc of fluids, blood pressure has improved. -Observation admission to telemetry -Caution with further fluid given his chronic heart failure with reduced EF -Continue aspirin, Coreg, Entresto, Farxiga as blood pressure allows -2D echo pending per cardiology  Active Problems:   Hyperlipidemia-continue home statin   Cocaine abuse (HCC)-TOC consult  DVT prophylaxis: Lovenox     Code Status: Full Code  Consults called: Cardiology  Admission status: Observation  Time spent: 48 minutes  Keyna Blizard Sharlette Dense MD [facility] Pager [phone]  If 7PM-7AM, please contact night-coverage [email or web address] Password [email or web address]  11/18/2022, 2:17 PM

## 2022-11-18 NOTE — Progress Notes (Signed)
  Echocardiogram 2D Echocardiogram has been performed.  Sean Leblanc 11/18/2022, 2:23 PM

## 2022-11-18 NOTE — ED Provider Notes (Signed)
[place] EMERGENCY DEPARTMENT AT [facility] Provider Note   CSN: [id number] Arrival date & time: 11/18/22  4098     History  Chief Complaint  Patient presents with   Hypotension    Sean Leblanc is a 62 y.o. male.  Patient presents to the emergency department complaining of dizziness and low blood pressure.  Patient states that he gets up for work at approximately 3 AM and goes to work at [employer] AM.  He states he felt normal upon awakening this morning and went to work.  He states that while at work he began to feel sweaty, and then began to feel dizzy.  He states he sat in a chair and had his blood pressure checked by someone at work.  The automatic cuff at work was unable to read his blood pressure.  EMS was called and the patient was noted to be hypotensive with a blood pressure of 97/60 sitting which dropped to 70/58 when standing.  The patient was administered a 500 mL bolus of normal saline during transport with EMS.  At this time the patient is asymptomatic.  He denies chest pain, shortness of breath, dizziness, lightheadedness, diaphoresis, nausea, vomiting, abdominal pain.  His blood pressure while sitting at this time has normalized.  Patient with past medical history significant for cardiomyopathy, chronic heart failure with reduced ejection fraction, NSTEMI, cocaine abuse, left heart cath with coronary angiography in August 2022, coronary artery spasm, dyspnea on exertion, TIA, hypertension  HPI     Home Medications Prior to Admission medications   Medication Sig Start Date End Date Taking? Authorizing Provider  aspirin EC 81 MG tablet Take 1 tablet (81 mg total) by mouth daily. Swallow whole. 03/05/21  Yes Tolia, Sunit, DO  atorvastatin (LIPITOR) 80 MG tablet Take 1 tablet (80 mg total) by mouth daily. 08/17/22 08/17/23 Yes Merrilyn Puma, MD  bumetanide (BUMEX) 1 MG tablet Take 1 tablet (1mg ) by mouth daily AS NEEDED for weight gain >3 lbs or leg swelling. Patient  taking differently: Take 0.5 mg by mouth daily as needed (Swelling, weight gain >3 lbs.). 11/07/22  Yes Merrilyn Puma, MD  carvedilol (COREG) 6.25 MG tablet Take 1 tablet (6.25 mg total) by mouth 2 (two) times daily. 11/07/22  Yes Merrilyn Puma, MD  dapagliflozin propanediol (FARXIGA) 10 MG TABS tablet TAKE 1 TABLET(10 MG) BY MOUTH DAILY Patient taking differently: Take 10 mg by mouth daily. 11/07/22  Yes Merrilyn Puma, MD  sacubitril-valsartan (ENTRESTO) 49-51 MG Take 1 tablet by mouth 2 (two) times daily. 11/07/22  Yes Merrilyn Puma, MD  varenicline (CHANTIX) 1 MG tablet Take 1 tablet (1 mg total) by mouth 2 (two) times daily. 11/07/22  Yes Merrilyn Puma, MD  spironolactone (ALDACTONE) 25 MG tablet Take 0.5 tablets (12.5 mg total) by mouth daily. Patient not taking: Reported on 11/18/2022 11/07/22 11/02/23  Merrilyn Puma, MD      Allergies    Patient has no known allergies.    Review of Systems   Review of Systems  Physical Exam Updated Vital Signs BP 108/73   Pulse 64   Temp 97.8 F (36.6 C)   Resp 20   Ht 6' (1.829 m)   Wt 64.4 kg   SpO2 100%   BMI 19.26 kg/m  Physical Exam Vitals and nursing note reviewed.  Constitutional:      General: He is not in acute distress.    Appearance: He is well-developed.  HENT:     Head: Normocephalic and atraumatic.  Eyes:     Conjunctiva/sclera: Conjunctivae normal.  Cardiovascular:     Rate and Rhythm: Normal rate and regular rhythm.     Heart sounds: Murmur heard.  Pulmonary:     Effort: Pulmonary effort is normal. No respiratory distress.     Breath sounds: Normal breath sounds.  Abdominal:     Palpations: Abdomen is soft.     Tenderness: There is no abdominal tenderness.  Musculoskeletal:        General: No swelling.     Cervical back: Neck supple.     Right lower leg: No edema.     Left lower leg: No edema.  Skin:    General: Skin is warm and dry.     Capillary Refill: Capillary refill takes less than 2 seconds.   Neurological:     Mental Status: He is alert and oriented to person, place, and time.  Psychiatric:        Mood and Affect: Mood normal.     ED Results / Procedures / Treatments   Labs (all labs ordered are listed, but only abnormal results are displayed) Labs Reviewed  COMPREHENSIVE METABOLIC PANEL - Abnormal; Notable for the following components:      Result Value   Sodium 134 (*)    Glucose, Bld 111 (*)    Creatinine, Ser 1.39 (*)    Total Protein 8.2 (*)    Total Bilirubin 1.7 (*)    GFR, Estimated 57 (*)    All other components within normal limits  RAPID URINE DRUG SCREEN, HOSP PERFORMED - Abnormal; Notable for the following components:   Cocaine POSITIVE (*)    Tetrahydrocannabinol POSITIVE (*)    All other components within normal limits  BRAIN NATRIURETIC PEPTIDE - Abnormal; Notable for the following components:   B Natriuretic Peptide 593.8 (*)    All other components within normal limits  CBC WITH DIFFERENTIAL/PLATELET - Abnormal; Notable for the following components:   Monocytes Absolute 1.1 (*)    All other components within normal limits  D-DIMER, QUANTITATIVE (NOT AT [facility]) - Abnormal; Notable for the following components:   D-Dimer, Quant 0.64 (*)    All other components within normal limits  CBG MONITORING, ED - Abnormal; Notable for the following components:   Glucose-Capillary 109 (*)    All other components within normal limits  TROPONIN I (HIGH SENSITIVITY) - Abnormal; Notable for the following components:   Troponin I (High Sensitivity) 21 (*)    All other components within normal limits  CBC WITH DIFFERENTIAL/PLATELET  TROPONIN I (HIGH SENSITIVITY)    EKG EKG Interpretation  Date/Time:  Friday Nov 18 2022 06:36:40 EDT Ventricular Rate:  62 PR Interval:  156 QRS Duration: 109 QT Interval:  489 QTC Calculation: 497 R Axis:   -12 Text Interpretation: Sinus rhythm Probable left atrial enlargement TWIs in I, II, III, aVF, V3-V4 that have been  present before Confirmed by Vivi Barrack [phone]) on 11/18/2022 7:23:47 AM  Radiology CT Angio Chest PE W and/or Wo Contrast  Result Date: 11/18/2022 CLINICAL DATA:  PE suspected EXAM: CT ANGIOGRAPHY CHEST WITH CONTRAST TECHNIQUE: Multidetector CT imaging of the chest was performed using the standard protocol during bolus administration of intravenous contrast. Multiplanar CT image reconstructions and MIPs were obtained to evaluate the vascular anatomy. RADIATION DOSE REDUCTION: This exam was performed according to the departmental dose-optimization program which includes automated exposure control, adjustment of the mA and/or kV according to patient size and/or use of iterative reconstruction technique. CONTRAST:  75mL OMNIPAQUE IOHEXOL 350 MG/ML SOLN COMPARISON:  CT Chest 02/26/21 FINDINGS: Cardiovascular: Satisfactory opacification of the pulmonary arteries to the segmental level. No evidence of pulmonary embolism. Normal heart size. No pericardial effusion. Mediastinum/Nodes: No enlarged mediastinal, hilar, or axillary lymph nodes. Thyroid gland, trachea, and esophagus demonstrate no significant findings. Lungs/Pleura: No pleural effusion. No pneumothorax. No large consolidative opacity. There are a few scattered cystic lesions in the bilateral upper and lower lobes unchanged from prior exam, and favored to represent emphysematous change. 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 Musculoskeletal: No chest wall abnormality. No acute or significant osseous findings. Review of the MIP images confirms the above findings. IMPRESSION: No evidence of pulmonary embolism or other acute intrathoracic process. Electronically Signed   By: Lorenza Cambridge M.D.   On: 11/18/2022 13:18   DG Chest Portable 1 View  Result Date: 11/18/2022 CLINICAL DATA:  Dizziness.  Syncope. EXAM: PORTABLE CHEST 1 VIEW COMPARISON:  The visualized osseous structures are unremarkable. FINDINGS: Mild cardiac enlargement  is stable. Lung volumes are normal. No pleural effusion or edema. No airspace opacities. Visualized osseous structures are unremarkable. IMPRESSION: No acute cardiopulmonary abnormalities. Electronically Signed   By: Signa Kell M.D.   On: 11/18/2022 07:07    Procedures Procedures    Medications Ordered in ED Medications  sodium chloride 0.9 % bolus 500 mL (0 mLs Intravenous Stopped 11/18/22 1000)  0.9 %  sodium chloride infusion (0 mLs Intravenous Stopped 11/18/22 1252)  iohexol (OMNIPAQUE) 350 MG/ML injection 75 mL (75 mLs Intravenous Contrast Given 11/18/22 1246)    ED Course/ Medical Decision Making/ A&P                             Medical Decision Making Amount and/or Complexity of Data Reviewed Radiology: ordered.  Risk Prescription drug management. Decision regarding hospitalization.   This patient presents to the ED for concern of lightheadedness, this involves an extensive number of treatment options, and is a complaint that carries with it a high risk of complications and morbidity.  The differential diagnosis includes NSTEMI, dehydration, vasovagal response, CHF exacerbation, others   Co morbidities that complicate the patient evaluation  HFrEF, HTN, TIA   Additional history obtained:  Additional history obtained from EMS External records from outside source obtained and reviewed including internal medicine notes from 4/29. Routine follow up with medication refills. No new medications added   Lab Tests:  I Ordered, and personally interpreted labs.  The pertinent results include: Initial troponin 21, repeat 16; UDS positive for cocaine and THC; BNP 593; creatinine 1.39 (baseline appears to be 8.8); grossly unremarkable CBC   Imaging Studies ordered:  I ordered imaging studies including chest x-ray and CT chest PE study I independently visualized and interpreted imaging which showed no acute findings No evidence of pulmonary embolism or other acute  intrathoracic  process.   I agree with the radiologist interpretation   Cardiac Monitoring: / EKG:  The patient was maintained on a cardiac monitor.  I personally viewed and interpreted the cardiac monitored which showed an underlying rhythm of: sinus rhythm   Consultations Obtained:  I requested consultation with the cardiologist, Dr.Tolia,  and discussed lab and imaging findings as well as pertinent plan - they recommend: d-dimer, admission to medicine with telemetry, plan on formal consult I requested consultation with the hospitalist. Dr.Ikram agrees to see the patient for admission   Problem List / ED Course / Critical interventions /  Medication management   I ordered medication including saline bolus for fluid resuscitation Reevaluation of the patient after these medicines showed that the patient improved I have reviewed the patients home medicines and have made adjustments as needed   Test / Admission - Considered:  Patient with near syncope and hypotension, positive for orthostatic changes earlier in the day, AKI.  Patient with EKG with T wave inversions.  Patient will benefit from admission for rehydration, monitoring overnight to check for any dysrhythmia, echocardiogram         Final Clinical Impression(s) / ED Diagnoses Final diagnoses:  Near syncope  AKI (acute kidney injury) [facility])    Rx / DC Orders ED Discharge Orders     None         Pamala Duffel 11/18/22 1404    Loetta Rough, MD 12/02/22 1731

## 2022-11-18 NOTE — ED Triage Notes (Addendum)
BIBA from work was hot and dizzy near syncopal episode, 97/60s sitting then went to 70/58 standing, no other complaints 20 LAC 500 NS CBG 133

## 2022-11-18 NOTE — Consult Note (Signed)
CARDIOLOGY CONSULT NOTE  Patient ID: Sean Leblanc MRN: [id number] DOB/AGE: 11/29/1960 62 y.o.  Admit date: 11/18/2022 Referring Physician  Dr Jearld Fenton Primary Physician:  Merrilyn Puma, MD Reason for Consultation  near syncope  Patient ID: Sean Leblanc, male    DOB: 12/04/1960, 62 y.o.   MRN: [id number]  Chief Complaint  Patient presents with   Hypotension   HPI:    Sean Leblanc  is a 62 y.o. male with past medical history significant for non-ischemic cardiomyopathy, heart failure with reduced ejection fraction, and hypertension who presented to the ED with near-syncope.  He has not followed up in our office since 2022 but he does follow with internal medicine regularly. This morning he was at work and he started to feel really light-headed like he was going to pass out. His co-workers sat him down in a chair and EMS was called. He was found to have orthostatic hypotension and is severely dehydrated. He states he has been drinking beer and only intermittent sips of water. He does acknowledge that he does not drink nearly enough water on a daily basis. He admits his light-headedness was related to positional changes and he also realizes he is dehydrated. Denies chest pain, shortness of breath, palpitations, diaphoresis, PND, edema, PND, orthopnea.   Past Medical History:  Diagnosis Date   Abnormal EKG    Anginal equivalent    Atherosclerosis of aorta (HCC)    Cardiomyopathy (HCC)    CHF (congestive heart failure) (HCC)    Continuous dependence on cigarette smoking    Coronary artery spasm (HCC) 02/26/2021   Coronary atherosclerosis due to calcified coronary lesion    Dyspnea on exertion    Hyperlipidemia    Hypertension    Hypertensive urgency 03/04/2016   Inclusion cyst 07/08/2020   Nonobstructive atherosclerosis of coronary artery    TIA (transient ischemic attack)    Past Surgical History:  Procedure Laterality Date   LEFT HEART CATH AND CORONARY ANGIOGRAPHY N/A  02/26/2021   Procedure: LEFT HEART CATH AND CORONARY ANGIOGRAPHY;  Surgeon: Yates Decamp, MD;  Location: [facility];  Service: Cardiovascular;  Laterality: N/A;   Social History   Tobacco Use   Smoking status: Former    Packs/day: 0.40    Years: 10.00    Additional pack years: 0.00    Total pack years: 4.00    Types: Cigarettes   Smokeless tobacco: Never   Tobacco comments:    1-2 per week   Substance Use Topics   Alcohol use: Not Currently    Comment: 2 X weekly - 6pack - 12 pack    Family History  Problem Relation Age of Onset   Heart attack Mother    Cancer Father     Marital Status: Single  ROS  Review of Systems  Cardiovascular:  Positive for near-syncope.  Neurological:  Positive for light-headedness.   Objective      11/18/2022   11:30 AM 11/18/2022   11:10 AM 11/18/2022   10:30 AM  Vitals with BMI  Systolic 106 141 782  Diastolic 74 91 99  Pulse 56 58 72    Blood pressure 106/74, pulse (!) 56, temperature 98.4 F (36.9 C), temperature source Oral, resp. rate 18, height 6' (1.829 m), weight 64.4 kg, SpO2 100 %.    Physical Exam Vitals reviewed.  HENT:     Head: Normocephalic and atraumatic.  Cardiovascular:     Rate and Rhythm: Normal rate and regular rhythm.  Heart sounds: Normal heart sounds. No murmur heard. Pulmonary:     Effort: Pulmonary effort is normal.     Breath sounds: Normal breath sounds.  Abdominal:     General: Bowel sounds are normal.  Musculoskeletal:     Right lower leg: No edema.     Left lower leg: No edema.  Skin:    General: Skin is warm and dry.  Neurological:     Mental Status: He is alert.    Laboratory examination:   Recent Labs    12/23/21 1439 11/07/22 1649 11/18/22 0641  NA 144 140 134*  K 4.2 4.0 4.9  CL 108* 103 100  CO2 17* 19* 24  GLUCOSE 115* 76 111*  BUN 9 11 17   CREATININE 0.82 0.81 1.39*  CALCIUM 10.1 10.0 9.4  GFRNONAA  --   --  57*   estimated creatinine clearance is 50.2 mL/min (A)  (by C-G formula based on SCr of 1.39 mg/dL (H)).     Latest Ref Rng & Units 11/18/2022    6:41 AM 11/07/2022    4:49 PM 12/23/2021    2:39 PM  CMP  Glucose 70 - 99 mg/dL 098  76  119   BUN 8 - 23 mg/dL 17  11  9    Creatinine 0.61 - 1.24 mg/dL 1.47  8.29  5.62   Sodium 135 - 145 mmol/L 134  140  144   Potassium 3.5 - 5.1 mmol/L 4.9  4.0  4.2   Chloride 98 - 111 mmol/L 100  103  108   CO2 22 - 32 mmol/L 24  19  17    Calcium 8.9 - 10.3 mg/dL 9.4  13.0  86.5   Total Protein 6.5 - 8.1 g/dL 8.2     Total Bilirubin 0.3 - 1.2 mg/dL 1.7     Alkaline Phos 38 - 126 U/L 55     AST 15 - 41 U/L 27     ALT 0 - 44 U/L 24         Latest Ref Rng & Units 11/18/2022    6:56 AM 02/26/2021    8:26 AM 06/30/2020    9:27 AM  CBC  WBC 4.0 - 10.5 K/uL 9.3  6.5  7.0   Hemoglobin 13.0 - 17.0 g/dL 78.4  69.6  29.5   Hematocrit 39.0 - 52.0 % 48.5  41.6  47.5   Platelets 150 - 400 K/uL 272  304  289    Lipid Panel Recent Labs    08/11/22 1537  CHOL 158  TRIG 66  LDLCALC 104*  HDL 41  CHOLHDL 3.9    HEMOGLOBIN A1C Lab Results  Component Value Date   HGBA1C 5.0 02/26/2021   MPG 96.8 02/26/2021   TSH No results for input(s): "TSH" in the last 8760 hours. BNP (last 3 results) Recent Labs    11/18/22 0656  BNP 593.8*   Cardiac Panel (last 3 results) Recent Labs    11/18/22 0641 11/18/22 1010  TROPONINIHS 21* 16     Medications and allergies  No Known Allergies   Current Meds  Medication Sig   aspirin EC 81 MG tablet Take 1 tablet (81 mg total) by mouth daily. Swallow whole.   atorvastatin (LIPITOR) 80 MG tablet Take 1 tablet (80 mg total) by mouth daily.   bumetanide (BUMEX) 1 MG tablet Take 1 tablet (1mg ) by mouth daily AS NEEDED for weight gain >3 lbs or leg swelling. (Patient taking differently: Take 0.5 mg by  mouth daily as needed (Swelling, weight gain >3 lbs.).)   carvedilol (COREG) 6.25 MG tablet Take 1 tablet (6.25 mg total) by mouth 2 (two) times daily.   dapagliflozin  propanediol (FARXIGA) 10 MG TABS tablet TAKE 1 TABLET(10 MG) BY MOUTH DAILY (Patient taking differently: Take 10 mg by mouth daily.)   sacubitril-valsartan (ENTRESTO) 49-51 MG Take 1 tablet by mouth 2 (two) times daily.   varenicline (CHANTIX) 1 MG tablet Take 1 tablet (1 mg total) by mouth 2 (two) times daily.    Scheduled Meds: Continuous Infusions: PRN Meds:.   No intake/output data recorded. No intake/output data recorded.  Net IO Since Admission: No IO data has been entered for this period [11/18/22 1159]   Radiology:   Imaging results have been reviewed and DG Chest Portable 1 View  Result Date: 11/18/2022 CLINICAL DATA:  Dizziness.  Syncope. EXAM: PORTABLE CHEST 1 VIEW COMPARISON:  The visualized osseous structures are unremarkable. FINDINGS: Mild cardiac enlargement is stable. Lung volumes are normal. No pleural effusion or edema. No airspace opacities. Visualized osseous structures are unremarkable. IMPRESSION: No acute cardiopulmonary abnormalities. Electronically Signed   By: Signa Kell M.D.   On: 11/18/2022 07:07    Cardiac Studies:   Left Heart Catheterization 02/26/21:  LV: 182/0, EDP 13 mmHg.  Aortic pressure 170/87, mean 117 mmHg.  There was no pressure gradient across the aortic valve. LVEF: Global hypokinesis, size upper limit of normal, EF 20 to 25%. Coronary arteries: Very mild disease in the ramus intermediate constituting 30% otherwise minimal disease in the proximal and mid LAD of 10% and circumflex proximal and mid 10%.  Right coronary artery is normal.   LM: Smooth and normal. LAD: Large vessel, gives origin to a moderate-sized D1 and several small diagonals, has minimal disease in the proximal segment. CX: Large vessel, has mild luminal irregularity in the proximal segment.   RI: Mild diffuse luminal irregularity.  Constitutes a proximal 20 to 30% stenosis. RCA: Dominant.  Normal.   ECHO 02/26/2021:  1. Left ventricular ejection fraction, by estimation,  is 20 to 25%. Left  ventricular ejection fraction by 2D MOD biplane is 32.3 %. The left  ventricle has severely decreased function. The left ventricle demonstrates  global hypokinesis. The left  ventricular internal cavity size was mildly dilated. There is mild left  ventricular hypertrophy. Left ventricular diastolic function could not be  evaluated.   2. Right ventricular systolic function is normal. The right ventricular  size is normal. Mildly increased right ventricular wall thickness. There  is normal pulmonary artery systolic pressure. The estimated right  ventricular systolic pressure is 31.5  mmHg.   3. Left atrial size was mildly dilated.   4. Right atrial size was mildly dilated.   5. The mitral valve is normal in structure. Trivial mitral valve  regurgitation. No evidence of mitral stenosis.   6. Tricuspid valve regurgitation is mild to moderate.   7. The aortic valve is normal in structure. Aortic valve regurgitation is  not visualized. No aortic stenosis is present.   8. The inferior vena cava is normal in size with greater than 50%  respiratory variability, suggesting right atrial pressure of 3 mmHg.   EKG: 03/05/2021: Normal sinus rhythm, 67bpm, LAE, LVH per voltage criteria, TWI inferior lateral leads secondary to LVH but anterolateral and inferior ischemia cannot be ruled out.  Similar findings on prior ECGs.  11/18/2022: Sinus rhythm with left atrial enlargement. TWIs in I, II, III, aVF, V3-V4 that have been present  before. No significant change.  Assessment & Recommendations:   Pre-syncope 2/2 orthostatic hypotension from dehydration Will give additional 1L fluid bolus as patient is severely dehydrated Hold anti-hypertensives until he is no longer orthostatic   HFrEF 25-30% currently compensated Non-ischemic cardiomyopathy Repeat echocardiogram ordered He is not in acute heart failure, no edema or orthopnea BNP is at his baseline Will hold off on GDMT at this  time given hypotension and increased Cr Would initiate Entresto, Jardiance or Farxiga, Bidil as BP and Cr allows   Non-obstructive CAD  Heart catheterization in 2022 shows non-obstructive CAD Denies chest pain, dyspnea on exertion He is not limited in terms of activity, work, or ADLs     [place], DO 11/18/2022, 11:59 AM Office: [phone]

## 2022-11-19 DIAGNOSIS — F1290 Cannabis use, unspecified, uncomplicated: Secondary | ICD-10-CM

## 2022-11-19 DIAGNOSIS — F1010 Alcohol abuse, uncomplicated: Secondary | ICD-10-CM

## 2022-11-19 DIAGNOSIS — Z91199 Patient's noncompliance with other medical treatment and regimen due to unspecified reason: Secondary | ICD-10-CM

## 2022-11-19 DIAGNOSIS — F1490 Cocaine use, unspecified, uncomplicated: Secondary | ICD-10-CM

## 2022-11-19 DIAGNOSIS — E785 Hyperlipidemia, unspecified: Secondary | ICD-10-CM | POA: Diagnosis not present

## 2022-11-19 DIAGNOSIS — I951 Orthostatic hypotension: Secondary | ICD-10-CM | POA: Diagnosis not present

## 2022-11-19 DIAGNOSIS — I428 Other cardiomyopathies: Secondary | ICD-10-CM

## 2022-11-19 DIAGNOSIS — I110 Hypertensive heart disease with heart failure: Secondary | ICD-10-CM | POA: Diagnosis not present

## 2022-11-19 DIAGNOSIS — I5022 Chronic systolic (congestive) heart failure: Secondary | ICD-10-CM | POA: Diagnosis not present

## 2022-11-19 HISTORY — DX: Cocaine use, unspecified, uncomplicated: F14.90

## 2022-11-19 HISTORY — DX: Alcohol abuse, uncomplicated: F10.10

## 2022-11-19 HISTORY — DX: Other cardiomyopathies: I42.8

## 2022-11-19 HISTORY — DX: Cannabis use, unspecified, uncomplicated: F12.90

## 2022-11-19 LAB — BASIC METABOLIC PANEL
Anion gap: 9 (ref 5–15)
BUN: 17 mg/dL (ref 8–23)
CO2: 23 mmol/L (ref 22–32)
Calcium: 8.5 mg/dL — ABNORMAL LOW (ref 8.9–10.3)
Chloride: 104 mmol/L (ref 98–111)
Creatinine, Ser: 0.91 mg/dL (ref 0.61–1.24)
GFR, Estimated: >60 mL/min (ref >60)
Glucose, Bld: 94 mg/dL (ref 70–99)
Potassium: 3.8 mmol/L (ref 3.5–5.1)
Sodium: 136 mmol/L (ref 135–145)

## 2022-11-19 LAB — CBC
HCT: 43.4 % (ref 39.0–52.0)
Hemoglobin: 13.7 g/dL (ref 13.0–17.0)
MCH: 28.5 pg (ref 26.0–34.0)
MCHC: 31.6 g/dL (ref 30.0–36.0)
MCV: 90.2 fL (ref 80.0–100.0)
Platelets: 209 10*3/uL (ref 150–400)
RBC: 4.81 MIL/uL (ref 4.22–5.81)
RDW: 13.2 % (ref 11.5–15.5)
WBC: 5.4 10*3/uL (ref 4.0–10.5)
nRBC: 0 % (ref 0.0–0.2)

## 2022-11-19 LAB — HIV ANTIBODY (ROUTINE TESTING W REFLEX): HIV Screen 4th Generation wRfx: NONREACTIVE

## 2022-11-19 MED ORDER — ENTRESTO 24-26 MG PO TABS: 1.0000 | ORAL_TABLET | Freq: Two times a day (BID) | ORAL | 0 refills | Status: DC

## 2022-11-19 NOTE — Discharge Instructions (Addendum)
Follow with Sean Puma, MD in 5-7 days  Please note that your Entresto dose has been changed.  Please check your blood pressure before you take Entresto and do not take if your systolic blood pressure is less than 120 mmHg  Please get a complete blood count and chemistry panel checked by your Primary MD at your next visit, and again as instructed by your Primary MD. Please get your medications reviewed and adjusted by your Primary MD.  Please request your Primary MD to go over all Hospital Tests and Procedure/Radiological results at the follow up, please get all Hospital records sent to your Prim MD by signing hospital release before you go home.  In some cases, there will be blood work, cultures and biopsy results pending at the time of your discharge. Please request that your primary care M.D. goes through all the records of your hospital data and follows up on these results.  If you had Pneumonia of Lung problems at the Hospital: Please get a 2 view Chest X ray done in 6-8 weeks after hospital discharge or sooner if instructed by your Primary MD.  If you have Congestive Heart Failure: Please call your Cardiologist or Primary MD anytime you have any of the following symptoms:  1) 3 pound weight gain in 24 hours or 5 pounds in 1 week  2) shortness of breath, with or without a dry hacking cough  3) swelling in the hands, feet or stomach  4) if you have to sleep on extra pillows at night in order to breathe  Follow cardiac low salt diet and 1.5 lit/day fluid restriction.  If you have diabetes Accuchecks 4 times/day, Once in AM empty stomach and then before each meal. Log in all results and show them to your primary doctor at your next visit. If any glucose reading is under 80 or above 300 call your primary MD immediately.  If you have Seizure/Convulsions/Epilepsy: Please do not drive, operate heavy machinery, participate in activities at heights or participate in high speed sports  until you have seen by Primary MD or a Neurologist and advised to do so again. Per [facility] statutes, patients with seizures are not allowed to drive until they have been seizure-free for six months.  Use caution when using heavy equipment or power tools. Avoid working on ladders or at heights. Take showers instead of baths. Ensure the water temperature is not too high on the home water heater. Do not go swimming alone. Do not lock yourself in a room alone (i.e. bathroom). When caring for infants or small children, sit down when holding, feeding, or changing them to minimize risk of injury to the child in the event you have a seizure. Maintain good sleep hygiene. Avoid alcohol.   If you had Gastrointestinal Bleeding: Please ask your Primary MD to check a complete blood count within one week of discharge or at your next visit. Your endoscopic/colonoscopic biopsies that are pending at the time of discharge, will also need to followed by your Primary MD.  Get Medicines reviewed and adjusted. Please take all your medications with you for your next visit with your Primary MD  Please request your Primary MD to go over all hospital tests and procedure/radiological results at the follow up, please ask your Primary MD to get all Hospital records sent to his/her office.  If you experience worsening of your admission symptoms, develop shortness of breath, life threatening emergency, suicidal or homicidal thoughts you must seek medical attention  immediately by calling 911 or calling your MD immediately  if symptoms less severe.  You must read complete instructions/literature along with all the possible adverse reactions/side effects for all the Medicines you take and that have been prescribed to you. Take any new Medicines after you have completely understood and accpet all the possible adverse reactions/side effects.   Do not drive or operate heavy machinery when taking Pain medications.   Do not  take more than prescribed Pain, Sleep and Anxiety Medications  Special Instructions: If you have smoked or chewed Tobacco  in the last 2 yrs please stop smoking, stop any regular Alcohol  and or any Recreational drug use.  Wear Seat belts while driving.  Please note You were cared for by a hospitalist during your hospital stay. If you have any questions about your discharge medications or the care you received while you were in the hospital after you are discharged, you can call the unit and asked to speak with the hospitalist on call if the hospitalist that took care of you is not available. Once you are discharged, your primary care physician will handle any further medical issues. Please note that NO REFILLS for any discharge medications will be authorized once you are discharged, as it is imperative that you return to your primary care physician (or establish a relationship with a primary care physician if you do not have one) for your aftercare needs so that they can reassess your need for medications and monitor your lab values.  You can reach the hospitalist office at phone [phone] or fax [phone]   If you do not have a primary care physician, you can call [phone] for a physician referral.  Activity: As tolerated with Full fall precautions use walker/cane & assistance as needed    Diet: low sodium  Disposition Home

## 2022-11-19 NOTE — Plan of Care (Signed)

## 2022-11-19 NOTE — Discharge Summary (Signed)
Physician Discharge Summary  [name] WJX:[id number] DOB: Jan 10, 1961 DOA: 11/18/2022  PCP: Merrilyn Puma, MD  Admit date: 11/18/2022 Discharge date: 11/19/2022  Admitted From: home Disposition:  home  Recommendations for Outpatient Follow-up:  Follow up with PCP in 1-2 weeks  Home Health: none Equipment/Devices: none  Discharge Condition: stable CODE STATUS: Full code  HPI: Per admitting MD, Sean Leblanc is a 62 y.o. male with medical history significant for nonischemic cardiomyopathy EF 20%, hypertension, hyperlipidemia being admitted to the hospital with orthostatic hypotension and presyncope.  Patient states that he has been in his usual state of health, doing well, when he went to work early this morning at about 5 AM, almost passed out.  It is certainly positional related, he gets dizzy when he stands up.  Admits to not drinking nearly enough water as he should, knows that he is likely dehydrated.  Denies any chest pain, fevers, chills, vomiting, diarrhea.  He was given a total of 1500 cc normal saline in the emergency department, after which his orthostasis slightly improved however he did still feel slightly dizzy when he ambulated after fluids.  He was already seen by cardiology in the emergency department.  They have ordered 2D echo.  Hospitalist admission was requested for further evaluation and workup.   Hospital Course / Discharge diagnoses: Principal Problem:   Orthostatic hypotension Active Problems:   Hyperlipidemia   Cocaine abuse (HCC)   Chronic HFrEF (heart failure with reduced ejection fraction) (HCC)  Principal problem Orthostatic hypotension in setting of known systolic CHF -patient was admitted to the hospital and cardiology was consulted.  Patient was felt to be dehydrated and was given 1.5 L of fluid, and his home medications were held.  His orthostasis has improved, he is now back to baseline.  Underwent a 2D echo which showed an LVEF of 35-40%, with  global hypokinesis.  RV function was mildly reduced.  Discussed with Dr. Odis Hollingshead with cardiology, with improvement in his symptoms he will be discharged home in stable condition.  His Entresto dose was cut in half and patient was advised to check his blood pressure before administering Entresto at home.  He is to continue his budesonide PRN, Jardiance, Coreg.  He is not taking spironolactone.  Active problems Hyperlipidemia-continue statin Polysubstance abuse-counseled for cessation  Sepsis ruled out   Discharge Instructions   Allergies as of 11/19/2022   No Known Allergies      Medication List     STOP taking these medications    Entresto 49-51 MG Generic drug: sacubitril-valsartan Replaced by: Sherryll Burger 24-26 MG   spironolactone 25 MG tablet Commonly known as: ALDACTONE       TAKE these medications    aspirin EC 81 MG tablet Take 1 tablet (81 mg total) by mouth daily. Swallow whole.   atorvastatin 80 MG tablet Commonly known as: Lipitor Take 1 tablet (80 mg total) by mouth daily.   bumetanide 1 MG tablet Commonly known as: BUMEX Take 1 tablet (1mg ) by mouth daily AS NEEDED for weight gain >3 lbs or leg swelling. What changed:  how much to take how to take this when to take this reasons to take this additional instructions   carvedilol 6.25 MG tablet Commonly known as: COREG Take 1 tablet (6.25 mg total) by mouth 2 (two) times daily.   dapagliflozin propanediol 10 MG Tabs tablet Commonly known as: Farxiga TAKE 1 TABLET(10 MG) BY MOUTH DAILY What changed:  how much to take how to take  this when to take this additional instructions   Entresto 24-26 MG Generic drug: sacubitril-valsartan Take 1 tablet by mouth 2 (two) times daily. Hold if your systolic BP is < 981 mm Hg Replaces: Entresto 49-51 MG   varenicline 1 MG tablet Commonly known as: CHANTIX Take 1 tablet (1 mg total) by mouth 2 (two) times daily.       Consultations: Cardiology    Procedures/Studies:  ECHOCARDIOGRAM COMPLETE  Result Date: 11/18/2022    ECHOCARDIOGRAM REPORT   Patient Name:   Sean Leblanc Date of Exam: 11/18/2022 Medical Rec #:  [id number]       Height:       72.0 in Accession #:    [id number]      Weight:       142.0 lb Date of Birth:  22-Jun-1961       BSA:          1.842 m Patient Age:    61 years        BP:           108/73 mmHg Patient Gender: M               HR:           73 bpm. Exam Location:  Inpatient Procedure: 2D Echo, Color Doppler, Cardiac Doppler and Intracardiac            Opacification Agent Indications:    Abnormal ECG  History:        Patient has prior history of Echocardiogram examinations, most                 recent 02/26/2021. Cardiomyopathy and CHF, Abnormal ECG, TIA,                 Signs/Symptoms:Dyspnea; Risk Factors:Hypertension, Dyslipidemia                 and Current Smoker.  Sonographer:    Milda Smart Referring Phys: [id number] SABINA CUSTOVIC  Sonographer Comments: Image acquisition challenging due to patient body habitus and Image acquisition challenging due to respiratory motion. IMPRESSIONS  1. Left ventricular ejection fraction, by estimation, is 35 to 40%. The left ventricle has moderately decreased function. The left ventricle demonstrates global hypokinesis. There is moderate left ventricular hypertrophy. Left ventricular diastolic parameters are indeterminate.  2. Right ventricular systolic function is mildly reduced. The right ventricular size is normal. Tricuspid regurgitation signal is inadequate for assessing PA pressure.  3. The mitral valve is normal in structure. Trivial mitral valve regurgitation. No evidence of mitral stenosis.  4. The aortic valve is tricuspid. Aortic valve regurgitation is not visualized. No aortic stenosis is present.  5. The inferior vena cava is normal in size with greater than 50% respiratory variability, suggesting right atrial pressure of 3 mmHg. FINDINGS  Left Ventricle: Left ventricular  ejection fraction, by estimation, is 35 to 40%. The left ventricle has moderately decreased function. The left ventricle demonstrates global hypokinesis. Definity contrast agent was given IV to delineate the left ventricular endocardial borders. The left ventricular internal cavity size was normal in size. There is moderate left ventricular hypertrophy. Left ventricular diastolic parameters are indeterminate. Right Ventricle: The right ventricular size is normal. Right vetricular wall thickness was not well visualized. Right ventricular systolic function is mildly reduced. Tricuspid regurgitation signal is inadequate for assessing PA pressure. Left Atrium: Left atrial size was normal in size. Right Atrium: Right atrial size was normal in size. Pericardium: There is no evidence of  pericardial effusion. Mitral Valve: The mitral valve is normal in structure. Trivial mitral valve regurgitation. No evidence of mitral valve stenosis. Tricuspid Valve: The tricuspid valve is normal in structure. Tricuspid valve regurgitation is trivial. Aortic Valve: The aortic valve is tricuspid. Aortic valve regurgitation is not visualized. No aortic stenosis is present. Pulmonic Valve: The pulmonic valve was not well visualized. Pulmonic valve regurgitation is trivial. Aorta: The aortic root and ascending aorta are structurally normal, with no evidence of dilitation. Venous: The inferior vena cava is normal in size with greater than 50% respiratory variability, suggesting right atrial pressure of 3 mmHg. IAS/Shunts: The interatrial septum was not well visualized.  LEFT VENTRICLE PLAX 2D LVIDd:         5.00 cm   Diastology LVIDs:         3.90 cm   LV e' medial:    2.89 cm/s LV PW:         1.40 cm   LV E/e' medial:  15.3 LV IVS:        1.30 cm   LV e' lateral:   3.94 cm/s LVOT diam:     2.20 cm   LV E/e' lateral: 11.2 LV SV:         54 LV SV Index:   30 LVOT Area:     3.80 cm  RIGHT VENTRICLE RV S prime:     9.73 cm/s TAPSE (M-mode): 1.5  cm LEFT ATRIUM             Index        RIGHT ATRIUM           Index LA diam:        1.90 cm 1.03 cm/m   RA Area:     14.60 cm LA Vol (A2C):   71.8 ml 38.98 ml/m  RA Volume:   39.70 ml  21.55 ml/m LA Vol (A4C):   42.9 ml 23.29 ml/m LA Biplane Vol: 52.9 ml 28.72 ml/m  AORTIC VALVE LVOT Vmax:   88.30 cm/s LVOT Vmean:  61.800 cm/s LVOT VTI:    0.143 m  AORTA Ao Root diam: 3.20 cm Ao Asc diam:  3.60 cm MITRAL VALVE MV Area (PHT): 1.81 cm    SHUNTS MV Decel Time: 420 msec    Systemic VTI:  0.14 m MR Peak grad: 37.7 mmHg    Systemic Diam: 2.20 cm MR Vmax:      307.00 cm/s MV E velocity: 44.10 cm/s MV A velocity: 49.20 cm/s MV E/A ratio:  0.90 Epifanio Lesches MD Electronically signed by Epifanio Lesches MD Signature Date/Time: 11/18/2022/2:51:47 PM    Final    CT Angio Chest PE W and/or Wo Contrast  Result Date: 11/18/2022 CLINICAL DATA:  PE suspected EXAM: CT ANGIOGRAPHY CHEST WITH CONTRAST TECHNIQUE: Multidetector CT imaging of the chest was performed using the standard protocol during bolus administration of intravenous contrast. Multiplanar CT image reconstructions and MIPs were obtained to evaluate the vascular anatomy. RADIATION DOSE REDUCTION: This exam was performed according to the departmental dose-optimization program which includes automated exposure control, adjustment of the mA and/or kV according to patient size and/or use of iterative reconstruction technique. CONTRAST:  75mL OMNIPAQUE IOHEXOL 350 MG/ML SOLN COMPARISON:  CT Chest 02/26/21 FINDINGS: Cardiovascular: Satisfactory opacification of the pulmonary arteries to the segmental level. No evidence of pulmonary embolism. Normal heart size. No pericardial effusion. Mediastinum/Nodes: No enlarged mediastinal, hilar, or axillary lymph nodes. Thyroid gland, trachea, and esophagus demonstrate no significant findings. Lungs/Pleura: No pleural  effusion. No pneumothorax. No large consolidative opacity. There are a few scattered cystic lesions  in the bilateral upper and lower lobes unchanged from prior exam, and favored to represent emphysematous change. 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 Musculoskeletal: No chest wall abnormality. No acute or significant osseous findings. Review of the MIP images confirms the above findings. IMPRESSION: No evidence of pulmonary embolism or other acute intrathoracic process. Electronically Signed   By: Lorenza Cambridge M.D.   On: 11/18/2022 13:18   DG Chest Portable 1 View  Result Date: 11/18/2022 CLINICAL DATA:  Dizziness.  Syncope. EXAM: PORTABLE CHEST 1 VIEW COMPARISON:  The visualized osseous structures are unremarkable. FINDINGS: Mild cardiac enlargement is stable. Lung volumes are normal. No pleural effusion or edema. No airspace opacities. Visualized osseous structures are unremarkable. IMPRESSION: No acute cardiopulmonary abnormalities. Electronically Signed   By: Signa Kell M.D.   On: 11/18/2022 07:07    Subjective: - no chest pain, shortness of breath, no abdominal pain, nausea or vomiting.   Discharge Exam: BP 122/74 (BP Location: Left Arm)   Pulse (!) 47   Temp (!) 97.4 F (36.3 C) (Oral)   Resp 18   Ht 6' (1.829 m)   Wt 64.4 kg   SpO2 98%   BMI 19.26 kg/m   General: Pt is alert, awake, not in acute distress Cardiovascular: RRR, S1/S2 +, no rubs, no gallops Respiratory: CTA bilaterally, no wheezing, no rhonchi Abdominal: Soft, NT, ND, bowel sounds + Extremities: no edema, no cyanosis    The results of significant diagnostics from this hospitalization (including imaging, microbiology, ancillary and laboratory) are listed below for reference.     Microbiology: No results found for this or any previous visit (from the past 240 hour(s)).   Labs: Basic Metabolic Panel: Recent Labs  Lab 11/18/22 0641 11/19/22 0543  NA 134* 136  K 4.9 3.8  CL 100 104  CO2 24 23  GLUCOSE 111* 94  BUN 17 17  CREATININE 1.39* 0.91  CALCIUM 9.4 8.5*    Liver Function Tests: Recent Labs  Lab 11/18/22 0641  AST 27  ALT 24  ALKPHOS 55  BILITOT 1.7*  PROT 8.2*  ALBUMIN 4.4   CBC: Recent Labs  Lab 11/18/22 0656 11/19/22 0543  WBC 9.3 5.4  NEUTROABS 5.7  --   HGB 15.3 13.7  HCT 48.5 43.4  MCV 91.2 90.2  PLT 272 209   CBG: Recent Labs  Lab 11/18/22 0655  GLUCAP 109*   Hgb A1c No results for input(s): "HGBA1C" in the last 72 hours. Lipid Profile No results for input(s): "CHOL", "HDL", "LDLCALC", "TRIG", "CHOLHDL", "LDLDIRECT" in the last 72 hours. Thyroid function studies Recent Labs    11/18/22 1500  TSH 1.188   Urinalysis    Component Value Date/Time   COLORURINE YELLOW 06/30/2020 1025   APPEARANCEUR CLEAR 06/30/2020 1025   LABSPEC 1.014 06/30/2020 1025   PHURINE 6.0 06/30/2020 1025   GLUCOSEU NEGATIVE 06/30/2020 1025   HGBUR NEGATIVE 06/30/2020 1025   BILIRUBINUR NEGATIVE 06/30/2020 1025   KETONESUR NEGATIVE 06/30/2020 1025   PROTEINUR NEGATIVE 06/30/2020 1025   NITRITE NEGATIVE 06/30/2020 1025   LEUKOCYTESUR NEGATIVE 06/30/2020 1025    FURTHER DISCHARGE INSTRUCTIONS:   Get Medicines reviewed and adjusted: Please take all your medications with you for your next visit with your Primary MD   Laboratory/radiological data: Please request your Primary MD to go over all hospital tests and procedure/radiological results at the follow up, please ask your Primary  MD to get all Hospital records sent to his/her office.   In some cases, they will be blood work, cultures and biopsy results pending at the time of your discharge. Please request that your primary care M.D. goes through all the records of your hospital data and follows up on these results.   Also Note the following: If you experience worsening of your admission symptoms, develop shortness of breath, life threatening emergency, suicidal or homicidal thoughts you must seek medical attention immediately by calling 911 or calling your MD immediately  if  symptoms less severe.   You must read complete instructions/literature along with all the possible adverse reactions/side effects for all the Medicines you take and that have been prescribed to you. Take any new Medicines after you have completely understood and accpet all the possible adverse reactions/side effects.    Do not drive when taking Pain medications or sleeping medications (Benzodaizepines)   Do not take more than prescribed Pain, Sleep and Anxiety Medications. It is not advisable to combine anxiety,sleep and pain medications without talking with your primary care practitioner   Special Instructions: If you have smoked or chewed Tobacco  in the last 2 yrs please stop smoking, stop any regular Alcohol  and or any Recreational drug use.   Wear Seat belts while driving.   Please note: You were cared for by a hospitalist during your hospital stay. Once you are discharged, your primary care physician will handle any further medical issues. Please note that NO REFILLS for any discharge medications will be authorized once you are discharged, as it is imperative that you return to your primary care physician (or establish a relationship with a primary care physician if you do not have one) for your post hospital discharge needs so that they can reassess your need for medications and monitor your lab values.  Time coordinating discharge: 40 minutes  SIGNED:  Pamella Pert, MD, PhD 11/19/2022, 1:16 PM

## 2022-11-19 NOTE — Progress Notes (Signed)
Progress Note  Patient Name: Sean Leblanc MRN: [id number] DOB: [birth year] Date of Encounter: 11/19/2022  Attending physician: Leatha Gilding, MD Primary care provider: Merrilyn Puma, MD  Subjective: Sean Leblanc is a 62 y.o. African-American male who was seen and examined at bedside  Resting in bed comfortably. Denies anginal chest pain or heart failure symptoms. Wishes to go home  Objective: Vital Signs in the last 24 hours: Temp:  [98 F (36.7 C)-98.4 F (36.9 C)] 98.1 F (36.7 C) (05/11 0241) Pulse Rate:  [51-70] 51 (05/11 0241) Resp:  [16-18] 16 (05/11 0241) BP: (98-135)/(66-88) 113/77 (05/11 0836) SpO2:  [97 %-99 %] 98 % (05/11 0241)  Intake/Output:  Intake/Output Summary (Last 24 hours) at 11/19/2022 1245 Last data filed at 11/18/2022 1252 Gross per 24 hour  Intake 1000 ml  Output --  Net 1000 ml    Net IO Since Admission: 1,000 mL [11/19/22 1245]  Weights:     11/18/2022    6:28 AM 11/07/2022    4:10 PM 08/11/2022    2:55 PM  Last 3 Weights  Weight (lbs) 142 lb 146 lb 14.4 oz 147 lb 3.2 oz  Weight (kg) 64.411 kg 66.633 kg 66.769 kg      Telemetry:  Overnight telemetry shows sinus with PVCs.  Episodes of NSVT predominantly nocturnal, which I personally reviewed.   Physical examination: PHYSICAL EXAM: Vitals:   11/18/22 1843 11/18/22 2242 11/19/22 0241 11/19/22 0836  BP: 127/75 112/66 98/69 113/77  Pulse: (!) 54 (!) 51 (!) 51   Resp: 17 16 16    Temp: 98.4 F (36.9 C) 98.1 F (36.7 C) 98.1 F (36.7 C)   TempSrc: Oral Oral Oral   SpO2: 97% 99% 98%   Weight:      Height:        Physical Exam  Constitutional: No distress.  Age appropriate, hemodynamically stable.   Neck: No JVD present.  Cardiovascular: Regular rhythm, S1 normal, S2 normal, intact distal pulses and normal pulses. Bradycardia present. Exam reveals no gallop, no S3 and no S4.  No murmur heard. Pulmonary/Chest: Effort normal and breath sounds normal. No stridor. He has no  wheezes. He has no rales.  Abdominal: Soft. Bowel sounds are normal. He exhibits no distension. There is no abdominal tenderness.  Musculoskeletal:        General: No edema.     Cervical back: Neck supple.  Neurological: He is alert and oriented to person, place, and time. He has intact cranial nerves (2-12).  Skin: Skin is warm and moist.    Lab Results: Chemistry Recent Labs  Lab 11/18/22 0641 11/19/22 0543  NA 134* 136  K 4.9 3.8  CL 100 104  CO2 24 23  GLUCOSE 111* 94  BUN 17 17  CREATININE 1.39* 0.91  CALCIUM 9.4 8.5*  PROT 8.2*  --   ALBUMIN 4.4  --   AST 27  --   ALT 24  --   ALKPHOS 55  --   BILITOT 1.7*  --   GFRNONAA 57* >60  ANIONGAP 10 9    Hematology Recent Labs  Lab 11/18/22 0656 11/19/22 0543  WBC 9.3 5.4  RBC 5.32 4.81  HGB 15.3 13.7  HCT 48.5 43.4  MCV 91.2 90.2  MCH 28.8 28.5  MCHC 31.5 31.6  RDW 13.2 13.2  PLT 272 209   High Sensitivity Troponin:   Recent Labs  Lab 11/18/22 0641 11/18/22 1010  TROPONINIHS 21* 16     Cardiac EnzymesNo results  for input(s): "TROPONINI" in the last 168 hours. No results for input(s): "TROPIPOC" in the last 168 hours.  BNP Recent Labs  Lab 11/18/22 0656  BNP 593.8*    DDimer  Recent Labs  Lab 11/18/22 1039  DDIMER 0.64*    Hemoglobin A1c:  Lab Results  Component Value Date   HGBA1C 5.0 02/26/2021   MPG 96.8 02/26/2021   TSH  Recent Labs    11/18/22 1500  TSH 1.188   Lipid Panel  Lab Results  Component Value Date   CHOL 158 08/11/2022   HDL 41 08/11/2022   LDLCALC 104 (H) 08/11/2022   TRIG 66 08/11/2022   CHOLHDL 3.9 08/11/2022   Drugs of Abuse     Component Value Date/Time   LABOPIA NONE DETECTED 11/18/2022 1029   COCAINSCRNUR POSITIVE (A) 11/18/2022 1029   LABBENZ NONE DETECTED 11/18/2022 1029   AMPHETMU NONE DETECTED 11/18/2022 1029   THCU POSITIVE (A) 11/18/2022 1029   LABBARB NONE DETECTED 11/18/2022 1029      Imaging: ECHOCARDIOGRAM COMPLETE  Result Date:  11/18/2022    ECHOCARDIOGRAM REPORT   Patient Name:   Sean Leblanc Babe Date of Exam: 11/18/2022 Medical Rec #:  [id number]       Height:       72.0 in Accession #:    [id number]      Weight:       142.0 lb Date of Birth:  11-26-1960       BSA:          1.842 m Patient Age:    61 years        BP:           108/73 mmHg Patient Gender: M               HR:           73 bpm. Exam Location:  Inpatient Procedure: 2D Echo, Color Doppler, Cardiac Doppler and Intracardiac            Opacification Agent Indications:    Abnormal ECG  History:        Patient has prior history of Echocardiogram examinations, most                 recent 02/26/2021. Cardiomyopathy and CHF, Abnormal ECG, TIA,                 Signs/Symptoms:Dyspnea; Risk Factors:Hypertension, Dyslipidemia                 and Current Smoker.  Sonographer:    Milda Smart Referring Phys: [id number] SABINA CUSTOVIC  Sonographer Comments: Image acquisition challenging due to patient body habitus and Image acquisition challenging due to respiratory motion. IMPRESSIONS  1. Left ventricular ejection fraction, by estimation, is 35 to 40%. The left ventricle has moderately decreased function. The left ventricle demonstrates global hypokinesis. There is moderate left ventricular hypertrophy. Left ventricular diastolic parameters are indeterminate.  2. Right ventricular systolic function is mildly reduced. The right ventricular size is normal. Tricuspid regurgitation signal is inadequate for assessing PA pressure.  3. The mitral valve is normal in structure. Trivial mitral valve regurgitation. No evidence of mitral stenosis.  4. The aortic valve is tricuspid. Aortic valve regurgitation is not visualized. No aortic stenosis is present.  5. The inferior vena cava is normal in size with greater than 50% respiratory variability, suggesting right atrial pressure of 3 mmHg. FINDINGS  Left Ventricle: Left ventricular ejection fraction, by estimation, is 35 to 40%. The  left ventricle has  moderately decreased function. The left ventricle demonstrates global hypokinesis. Definity contrast agent was given IV to delineate the left ventricular endocardial borders. The left ventricular internal cavity size was normal in size. There is moderate left ventricular hypertrophy. Left ventricular diastolic parameters are indeterminate. Right Ventricle: The right ventricular size is normal. Right vetricular wall thickness was not well visualized. Right ventricular systolic function is mildly reduced. Tricuspid regurgitation signal is inadequate for assessing PA pressure. Left Atrium: Left atrial size was normal in size. Right Atrium: Right atrial size was normal in size. Pericardium: There is no evidence of pericardial effusion. Mitral Valve: The mitral valve is normal in structure. Trivial mitral valve regurgitation. No evidence of mitral valve stenosis. Tricuspid Valve: The tricuspid valve is normal in structure. Tricuspid valve regurgitation is trivial. Aortic Valve: The aortic valve is tricuspid. Aortic valve regurgitation is not visualized. No aortic stenosis is present. Pulmonic Valve: The pulmonic valve was not well visualized. Pulmonic valve regurgitation is trivial. Aorta: The aortic root and ascending aorta are structurally normal, with no evidence of dilitation. Venous: The inferior vena cava is normal in size with greater than 50% respiratory variability, suggesting right atrial pressure of 3 mmHg. IAS/Shunts: The interatrial septum was not well visualized.  LEFT VENTRICLE PLAX 2D LVIDd:         5.00 cm   Diastology LVIDs:         3.90 cm   LV e' medial:    2.89 cm/s LV PW:         1.40 cm   LV E/e' medial:  15.3 LV IVS:        1.30 cm   LV e' lateral:   3.94 cm/s LVOT diam:     2.20 cm   LV E/e' lateral: 11.2 LV SV:         54 LV SV Index:   30 LVOT Area:     3.80 cm  RIGHT VENTRICLE RV S prime:     9.73 cm/s TAPSE (M-mode): 1.5 cm LEFT ATRIUM             Index        RIGHT ATRIUM           Index LA  diam:        1.90 cm 1.03 cm/m   RA Area:     14.60 cm LA Vol (A2C):   71.8 ml 38.98 ml/m  RA Volume:   39.70 ml  21.55 ml/m LA Vol (A4C):   42.9 ml 23.29 ml/m LA Biplane Vol: 52.9 ml 28.72 ml/m  AORTIC VALVE LVOT Vmax:   88.30 cm/s LVOT Vmean:  61.800 cm/s LVOT VTI:    0.143 m  AORTA Ao Root diam: 3.20 cm Ao Asc diam:  3.60 cm MITRAL VALVE MV Area (PHT): 1.81 cm    SHUNTS MV Decel Time: 420 msec    Systemic VTI:  0.14 m MR Peak grad: 37.7 mmHg    Systemic Diam: 2.20 cm MR Vmax:      307.00 cm/s MV E velocity: 44.10 cm/s MV A velocity: 49.20 cm/s MV E/A ratio:  0.90 Epifanio Lesches MD Electronically signed by Epifanio Lesches MD Signature Date/Time: 11/18/2022/2:51:47 PM    Final    CT Angio Chest PE W and/or Wo Contrast  Result Date: 11/18/2022 CLINICAL DATA:  PE suspected EXAM: CT ANGIOGRAPHY CHEST WITH CONTRAST TECHNIQUE: Multidetector CT imaging of the chest was performed using the standard protocol during bolus administration of intravenous contrast.  Multiplanar CT image reconstructions and MIPs were obtained to evaluate the vascular anatomy. RADIATION DOSE REDUCTION: This exam was performed according to the departmental dose-optimization program which includes automated exposure control, adjustment of the mA and/or kV according to patient size and/or use of iterative reconstruction technique. CONTRAST:  75mL OMNIPAQUE IOHEXOL 350 MG/ML SOLN COMPARISON:  CT Chest 02/26/21 FINDINGS: Cardiovascular: Satisfactory opacification of the pulmonary arteries to the segmental level. No evidence of pulmonary embolism. Normal heart size. No pericardial effusion. Mediastinum/Nodes: No enlarged mediastinal, hilar, or axillary lymph nodes. Thyroid gland, trachea, and esophagus demonstrate no significant findings. Lungs/Pleura: No pleural effusion. No pneumothorax. No large consolidative opacity. There are a few scattered cystic lesions in the bilateral upper and lower lobes unchanged from prior exam, and  favored to represent emphysematous change. 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 Musculoskeletal: No chest wall abnormality. No acute or significant osseous findings. Review of the MIP images confirms the above findings. IMPRESSION: No evidence of pulmonary embolism or other acute intrathoracic process. Electronically Signed   By: Lorenza Cambridge M.D.   On: 11/18/2022 13:18   DG Chest Portable 1 View  Result Date: 11/18/2022 CLINICAL DATA:  Dizziness.  Syncope. EXAM: PORTABLE CHEST 1 VIEW COMPARISON:  The visualized osseous structures are unremarkable. FINDINGS: Mild cardiac enlargement is stable. Lung volumes are normal. No pleural effusion or edema. No airspace opacities. Visualized osseous structures are unremarkable. IMPRESSION: No acute cardiopulmonary abnormalities. Electronically Signed   By: Signa Kell M.D.   On: 11/18/2022 07:07    CARDIAC DATABASE: EKG: 11/18/2022: Sinus rhythm, 62 bpm, TWI inferior & anterior leads concerning for ischemia, similar findings on prior EKG.  Echocardiogram: Nov 18, 2022: LVEF 35-40%, indeterminate diastolic function, global hypokinesis, right ventricular systolic function mildly reduced, RV size normal, trivial MR, estimated RAP 3 mmHg  Stress test: None  Heart catheterization: February 25, 2021: LV: 182/0, EDP 13 mmHg.  Aortic pressure 170/87, mean 117 mmHg.  There was no pressure gradient across the aortic valve. LVEF: Global hypokinesis, size upper limit of normal, EF 20 to 25%. Coronary arteries: Very mild disease in the ramus intermediate constituting 30% otherwise minimal disease in the proximal and mid LAD of 10% and circumflex proximal and mid 10%.  Right coronary artery is normal.   LM: Smooth and normal. LAD: Large vessel, gives origin to a moderate-sized D1 and several small diagonals, has minimal disease in the proximal segment. CX: Large vessel, has mild luminal irregularity in the proximal segment.   RI: Mild  diffuse luminal irregularity.  Constitutes a proximal 20 to 30% stenosis. RCA: Dominant.  Normal.     Scheduled Meds:  aspirin EC  81 mg Oral Daily   atorvastatin  80 mg Oral Daily   carvedilol  6.25 mg Oral BID   dapagliflozin propanediol  10 mg Oral Daily   enoxaparin (LOVENOX) injection  40 mg Subcutaneous Q24H    Continuous Infusions:   PRN Meds: acetaminophen **OR** acetaminophen, albuterol, ondansetron **OR** ondansetron (ZOFRAN) IV, traZODone   IMPRESSION & RECOMMENDATIONS: KEALII CHINEN is a 62 y.o. African-American male whose past medical history and cardiac risk factors include: Nonischemic cardiomyopathy, HFrEF, hypertension, hyperlipidemia, polysubstance abuse (EtOH, marijuana, cocaine), noncompliance.  Impression: Hypotension, orthostatic Chronic HFrEF. Nonischemic cardiomyopathy. Hypertension. Hyperlipidemia. Polysubstance abuse (80 ounces of alcohol per day, marijuana, cocaine) Noncompliance  Recommendations: Hypotension: Patient was orthostatic at the time of arrival.  Orthostasis likely secondary to dehydration, excessive use of alcohol consumption, and recent use of marijuana and cocaine.  Patient was given IV fluids during his hospitalization. GDMT was held secondary to hypotension. Blood pressures have improved Patient wishes to go home.  Chronic HFrEF. Nonischemic cardiomyopathy Given the recent hypotension will reduce Entresto from 49/51 mg p.o. twice daily to 24/26 mg p.o. twice daily (hold if SBP <120 mmHg).  At follow-up and medication can be uptitrated if blood pressure allows.   Continue carvedilol 6.25 mg p.o. twice daily. Continue Farxiga 10 mg p.o. daily. Continue Bumex 1 mg p.o. as needed for weight gain as he was prior to hospitalization. Was not on spironolactone prior to admission-can be reconsidered as outpatient. Continue Lipitor. Reemphasized the importance of complete cessation of alcohol-currently drinks 80 ounces per  day Reemphasized the importance of polysubstance abuse Recommend outpatient sleep study given the ectopy noted on telemetry during night  Hyperlipidemia: Continue statin therapy.  Patient is educated that given his underlying cardiomyopathy exposure to excessive amount of alcohol and polysubstance abuse can further lead to cardiovascular complications worsening his morbidity and mortality.  He verbalizes understanding.  Outpatient follow-up in 2 weeks recommended.  Patient's questions and concerns were addressed to his satisfaction. He voices understanding of the instructions provided during this encounter.   This note was created using a voice recognition software as a result there may be grammatical errors inadvertently enclosed that do not reflect the nature of this encounter. Every attempt is made to correct such errors.  Delilah Shan [facility]  Pager:  [phone] Office: [phone] 11/19/2022, 12:45 PM

## 2022-11-19 NOTE — Progress Notes (Signed)
Pt. Discharge ambulatory accompanied by NT to the main lobby. Pt. Is alert and oriented, no distress. Pt. Niece was unable to pick him up, patient decides to get an UBER for a ride. MD notified and ok for him to d/c by cab/UBER.

## 2022-11-19 NOTE — Plan of Care (Signed)
Pt had no issues over night

## 2022-11-24 ENCOUNTER — Telehealth: Payer: Self-pay

## 2022-11-24 NOTE — Telephone Encounter (Signed)
Called patient to do TOC and let him know of upcoming appt, pt did not answer. Left VM for him to call back.

## 2022-11-25 ENCOUNTER — Ambulatory Visit: Payer: Self-pay | Admitting: Internal Medicine

## 2022-11-25 NOTE — Progress Notes (Signed)
No show

## 2022-12-07 ENCOUNTER — Ambulatory Visit: Payer: BC Managed Care – PPO | Admitting: Internal Medicine

## 2022-12-10 DIAGNOSIS — Z419 Encounter for procedure for purposes other than remedying health state, unspecified: Secondary | ICD-10-CM | POA: Diagnosis not present

## 2022-12-12 ENCOUNTER — Encounter: Payer: Self-pay | Admitting: *Deleted

## 2023-01-06 ENCOUNTER — Ambulatory Visit (INDEPENDENT_AMBULATORY_CARE_PROVIDER_SITE_OTHER): Payer: BC Managed Care – PPO | Admitting: Student

## 2023-01-06 ENCOUNTER — Other Ambulatory Visit: Payer: Self-pay

## 2023-01-06 ENCOUNTER — Encounter: Payer: Self-pay | Admitting: Student

## 2023-01-06 VITALS — BP 112/73 | HR 67 | Temp 97.9°F | Ht 72.0 in | Wt 146.4 lb

## 2023-01-06 DIAGNOSIS — I5022 Chronic systolic (congestive) heart failure: Secondary | ICD-10-CM | POA: Diagnosis not present

## 2023-01-06 DIAGNOSIS — Z720 Tobacco use: Secondary | ICD-10-CM

## 2023-01-06 DIAGNOSIS — I110 Hypertensive heart disease with heart failure: Secondary | ICD-10-CM | POA: Diagnosis not present

## 2023-01-06 DIAGNOSIS — E7849 Other hyperlipidemia: Secondary | ICD-10-CM

## 2023-01-06 DIAGNOSIS — Z8673 Personal history of transient ischemic attack (TIA), and cerebral infarction without residual deficits: Secondary | ICD-10-CM | POA: Diagnosis not present

## 2023-01-06 DIAGNOSIS — I10 Essential (primary) hypertension: Secondary | ICD-10-CM

## 2023-01-06 DIAGNOSIS — F17210 Nicotine dependence, cigarettes, uncomplicated: Secondary | ICD-10-CM | POA: Diagnosis not present

## 2023-01-06 MED ORDER — ATORVASTATIN CALCIUM 80 MG PO TABS
80.0000 mg | ORAL_TABLET | Freq: Every day | ORAL | 3 refills | Status: DC
Start: 2023-01-06 — End: 2023-10-23

## 2023-01-06 MED ORDER — ENTRESTO 24-26 MG PO TABS: 1.0000 | ORAL_TABLET | Freq: Two times a day (BID) | ORAL | 3 refills | Status: DC

## 2023-01-06 NOTE — Assessment & Plan Note (Signed)
He continues to do well on chantix therapy. He is currently smoking, but down to 1 cigarette a day. We discussed choosing a quit date to stop completely. He will think about this and choose one when he is ready. He has enough chantix at home currently.  Plan: -continue chantix 1mg  BID -continue counseling at future visits

## 2023-01-06 NOTE — Assessment & Plan Note (Signed)
Last lipid panel with LDL 104. His lipitor was increased to 80mg  daily at that time. He is also on ASA 81mg  daily. Will plan to repeat lipid panel at next visit in 3 months to ensure LDL at goal. If LDL >70, consider addition of zetia.

## 2023-01-06 NOTE — Patient Instructions (Signed)
Mr. Sibayan,  It was a pleasure seeing you in the clinic today.   I have refilled your atorvastatin and entresto. Please call us if you need any other refills. Please come back to see [place] in 3 months.  Please call our clinic at [phone] if you have any questions or concerns. The best time to call is Monday-Friday from 9am-4pm, but there is someone available 24/7 at the same number. If you need medication refills, please notify your pharmacy one week in advance and they will send [place] a request.   Thank you for letting us take part in your care. We look forward to seeing you next time!

## 2023-01-06 NOTE — Progress Notes (Signed)
   CC: f/u HFrEF, HTN, tobacco use  HPI:  [name] is a 62 y.o. male with history listed below presenting to the [facility] for f/u HFrEF, HTN, tobacco use. Please see individualized problem based charting for full HPI.  Past Medical History:  Diagnosis Date   Abnormal EKG    Anginal equivalent    Atherosclerosis of aorta (HCC)    Cardiomyopathy (HCC)    CHF (congestive heart failure) (HCC)    Cocaine use 11/19/2022   Continuous dependence on cigarette smoking    Coronary artery spasm (HCC) 02/26/2021   Coronary atherosclerosis due to calcified coronary lesion    Dyspnea on exertion    ETOH abuse 11/19/2022   Hyperlipidemia    Hypertension    Hypertensive urgency 03/04/2016   Inclusion cyst 07/08/2020   Marijuana use 11/19/2022   Nonischemic cardiomyopathy (HCC) 11/19/2022   Nonobstructive atherosclerosis of coronary artery    NSTEMI (non-ST elevated myocardial infarction) (HCC)    Orthostatic hypotension 11/18/2022   TIA (transient ischemic attack)     Review of Systems:  Negative aside from that listed in individualized problem based charting.  Physical Exam:  Vitals:   01/06/23 0955  BP: 112/73  Pulse: 67  Temp: 97.9 F (36.6 C)  TempSrc: Oral  SpO2: 98%  Weight: 146 lb 6.4 oz (66.4 kg)  Height: 6' (1.829 m)   Physical Exam Constitutional:      Appearance: Normal appearance. He is not ill-appearing.  HENT:     Mouth/Throat:     Mouth: Mucous membranes are moist.     Pharynx: Oropharynx is clear.  Eyes:     General: No scleral icterus.    Extraocular Movements: Extraocular movements intact.     Conjunctiva/sclera: Conjunctivae normal.  Cardiovascular:     Rate and Rhythm: Normal rate and regular rhythm.     Heart sounds: Normal heart sounds. No murmur heard.    No friction rub. No gallop.     Comments: No JVD Pulmonary:     Effort: Pulmonary effort is normal.     Breath sounds: Normal breath sounds. No wheezing, rhonchi or rales.  Abdominal:      General: Bowel sounds are normal. There is no distension.     Palpations: Abdomen is soft.     Tenderness: There is no abdominal tenderness.  Musculoskeletal:        General: No swelling.  Skin:    General: Skin is warm and dry.  Neurological:     General: No focal deficit present.     Mental Status: He is alert.  Psychiatric:        Mood and Affect: Mood normal.        Behavior: Behavior normal.      Assessment & Plan:   See Encounters Tab for problem based charting.  Patient discussed with Dr.  Sol Blazing

## 2023-01-06 NOTE — Assessment & Plan Note (Signed)
Patient living with chronic HFrEF. ECHO in 2022 with EF 20-25%, but more recent ECHO last month showing improvement in EF to 35-40% with medical management. Current GDMT includes coreg 6.25mg  BID, entresto 24-26mg  BID, and farxiga 10mg  daily. He is also on bumex 0.5mg  daily prn.  He had a hospitalization for orthostatic hypotension about a month and a half ago. This improved with fluids and his home antihypertensive medications were adjusted. His spironolactone was discontinued and his entresto dose was decreased to 24-26mg  BID. He has been doing well since hospital discharge. No further episodes of lightheadedness or dizziness with positional changes. He is euvolemic on exam. No lower extremity edema, crackles, or JVD noted.  Plan: -continue coreg 6.25mg  BID, entresto 24-26mg  BID, farxiga 10mg  daily -continue bumex 0.5mg  daily prn

## 2023-01-06 NOTE — Assessment & Plan Note (Signed)
BP 112/73 in clinic today. Currently on coreg 6.25mg  BID, farxiga 10mg  daily, entresto 24-26mg  BID, bumex 0.5mg  daily prn. Denies any lightheadedness, dizziness, chest pain, SHOB.  -continue current regimen

## 2023-01-09 DIAGNOSIS — Z419 Encounter for procedure for purposes other than remedying health state, unspecified: Secondary | ICD-10-CM | POA: Diagnosis not present

## 2023-01-19 NOTE — Addendum Note (Signed)
Addended by: Dickie La on: 01/19/2023 10:04 AM   Modules accepted: Level of Service

## 2023-01-19 NOTE — Progress Notes (Signed)
Internal Medicine Clinic Attending  Case discussed with the resident at the time of the visit.  We reviewed the resident's history and exam and pertinent patient test results.  I agree with the assessment, diagnosis, and plan of care documented in the resident's note.  

## 2023-02-09 DIAGNOSIS — Z419 Encounter for procedure for purposes other than remedying health state, unspecified: Secondary | ICD-10-CM | POA: Diagnosis not present

## 2023-02-22 NOTE — Telephone Encounter (Signed)
No action done

## 2023-03-12 DIAGNOSIS — Z419 Encounter for procedure for purposes other than remedying health state, unspecified: Secondary | ICD-10-CM | POA: Diagnosis not present

## 2023-04-07 ENCOUNTER — Encounter: Payer: BC Managed Care – PPO | Admitting: Student

## 2023-04-11 DIAGNOSIS — Z419 Encounter for procedure for purposes other than remedying health state, unspecified: Secondary | ICD-10-CM | POA: Diagnosis not present

## 2023-05-02 ENCOUNTER — Ambulatory Visit: Payer: BC Managed Care – PPO | Admitting: Student

## 2023-05-02 ENCOUNTER — Encounter: Payer: Self-pay | Admitting: Student

## 2023-05-02 VITALS — BP 112/78 | HR 63 | Temp 97.4°F | Ht 72.0 in | Wt 147.0 lb

## 2023-05-02 DIAGNOSIS — E7849 Other hyperlipidemia: Secondary | ICD-10-CM

## 2023-05-02 DIAGNOSIS — I5022 Chronic systolic (congestive) heart failure: Secondary | ICD-10-CM | POA: Diagnosis not present

## 2023-05-02 DIAGNOSIS — I110 Hypertensive heart disease with heart failure: Secondary | ICD-10-CM

## 2023-05-02 DIAGNOSIS — E785 Hyperlipidemia, unspecified: Secondary | ICD-10-CM

## 2023-05-02 DIAGNOSIS — I10 Essential (primary) hypertension: Secondary | ICD-10-CM

## 2023-05-02 DIAGNOSIS — F1410 Cocaine abuse, uncomplicated: Secondary | ICD-10-CM

## 2023-05-02 DIAGNOSIS — Z0000 Encounter for general adult medical examination without abnormal findings: Secondary | ICD-10-CM

## 2023-05-02 DIAGNOSIS — Z1211 Encounter for screening for malignant neoplasm of colon: Secondary | ICD-10-CM

## 2023-05-02 DIAGNOSIS — R911 Solitary pulmonary nodule: Secondary | ICD-10-CM

## 2023-05-02 NOTE — Patient Instructions (Signed)
Thank you, Mr.Yuuki T Salazar for allowing [place] to provide your care today. Today we discussed your high blood pressure, tobacco use, substance use, FIT testing, and medications.   I have ordered the following labs for you:   Lab Orders         Lipid panel         CBC no Diff         Basic metabolic panel         POC FIT Test      Tests ordered today:  none  Referrals ordered today:   Referral Orders  No referral(s) requested today     I have ordered the following medication/changed the following medications:   Stop the following medications: There are no discontinued medications.   Start the following medications: No orders of the defined types were placed in this encounter.    Follow up: 3 months    Remember:   - I will call you to go over your lab results  - Please remember that the use of cocaine can lead to poor health outcomes including death, if you'd like greater support to quit please let us know - Congratulations on your reduced tobacco use! We will check in with you regarding your intended quit date (around New Years) - No need to follow up on a CT lung scan for now, it looks like the findings on your last lung imaging were not concerning. We will keep following up with repeat imaging at the recommended internal (1-3 years) - Please call us if you need any medication refills, keep up the great work taking them consistently  - Please remember to keep your legs/skin moisturized to help with the itch (Vaseline works great)  Should you have any questions or concerns please call the internal medicine clinic at [phone].     Candiace West Colbert Coyer, MD PGY-1 Internal Medicine Teaching [facility]

## 2023-05-03 LAB — LIPID PANEL
Chol/HDL Ratio: 3 ratio (ref 0.0–5.0)
Cholesterol, Total: 111 mg/dL (ref 100–199)
HDL: 37 mg/dL — ABNORMAL LOW (ref >39)
LDL Chol Calc (NIH): 57 mg/dL (ref 0–99)
Triglycerides: 89 mg/dL (ref 0–149)
VLDL Cholesterol Cal: 17 mg/dL (ref 5–40)

## 2023-05-03 LAB — BASIC METABOLIC PANEL
BUN/Creatinine Ratio: 13 (ref 10–24)
BUN: 11 mg/dL (ref 8–27)
CO2: 21 mmol/L (ref 20–29)
Calcium: 10.1 mg/dL (ref 8.6–10.2)
Chloride: 101 mmol/L (ref 96–106)
Creatinine, Ser: 0.87 mg/dL (ref 0.76–1.27)
Glucose: 150 mg/dL — ABNORMAL HIGH (ref 70–99)
Potassium: 3.9 mmol/L (ref 3.5–5.2)
Sodium: 138 mmol/L (ref 134–144)
eGFR: 98 mL/min/{1.73_m2} (ref >59)

## 2023-05-03 LAB — CBC
Hematocrit: 45.6 % (ref 37.5–51.0)
Hemoglobin: 14.6 g/dL (ref 13.0–17.7)
MCH: 28.7 pg (ref 26.6–33.0)
MCHC: 32 g/dL (ref 31.5–35.7)
MCV: 90 fL (ref 79–97)
Platelets: 311 10*3/uL (ref 150–450)
RBC: 5.09 x10E6/uL (ref 4.14–5.80)
RDW: 11.5 % — ABNORMAL LOW (ref 11.6–15.4)
WBC: 7 10*3/uL (ref 3.4–10.8)

## 2023-05-03 NOTE — Assessment & Plan Note (Addendum)
Patient's BP 112/78. Denies CP, heart palpitations, headache, or vision changes. CV exam unremarkable. Patient reports taking coreg 6.25 mg BID, farxiga 10 mg daily, entresto 49-51 BID, and 1/2 of spironolactone 25 mg. Of note, patient was told to take entresto 24-29 mg BID, discontinue spironolactone, and continue bumex 0.5 mg daily at his hospital discharge May 2024 and [facility] visit June 2024.  - Will need to call patient at home to clarify medications and doses to ensure patient is on the correct regimen

## 2023-05-03 NOTE — Assessment & Plan Note (Signed)
Patient is due for FIT testing, given kit a last clinic visit but did not turn it in.  - Reordered FIT testing, will follow up as needed

## 2023-05-03 NOTE — Progress Notes (Signed)
Established Patient Office Visit  Subjective   Patient ID: Sean Leblanc, male    DOB: 17-Dec-1960  Age: 62 y.o. MRN: [id number]  Chief Complaint  Patient presents with   Follow-up    Patient is a 62 yo. with a past medical history stated below who presents today for follow-up for HFrEF, HTN, HLD, and health maintenance. Please see problem based assessment and plan for additional details.     Past Medical History:  Diagnosis Date   Abnormal EKG    Anginal equivalent (HCC)    Atherosclerosis of aorta (HCC)    Cardiomyopathy (HCC)    CHF (congestive heart failure) (HCC)    Cocaine use 11/19/2022   Continuous dependence on cigarette smoking    Coronary artery spasm (HCC) 02/26/2021   Coronary atherosclerosis due to calcified coronary lesion    Dyspnea on exertion    ETOH abuse 11/19/2022   Hyperlipidemia    Hypertension    Hypertensive urgency 03/04/2016   Inclusion cyst 07/08/2020   Marijuana use 11/19/2022   Nonischemic cardiomyopathy (HCC) 11/19/2022   Nonobstructive atherosclerosis of coronary artery    NSTEMI (non-ST elevated myocardial infarction) (HCC)    Orthostatic hypotension 11/18/2022   TIA (transient ischemic attack)       Review of Systems  Skin:  Positive for itching.       Dry skin     Objective:     BP 112/78 (BP Location: Left Arm, Patient Position: Sitting, Cuff Size: Normal)   Pulse 63   Temp (!) 97.4 F (36.3 C) (Oral)   Ht 6' (1.829 m)   Wt 147 lb (66.7 kg)   SpO2 100%   BMI 19.94 kg/m  BP Readings from Last 3 Encounters:  05/02/23 112/78  01/06/23 112/73  11/19/22 122/74   Wt Readings from Last 3 Encounters:  05/02/23 147 lb (66.7 kg)  01/06/23 146 lb 6.4 oz (66.4 kg)  11/18/22 142 lb (64.4 kg)      Physical Exam Constitutional:      Appearance: Normal appearance.  HENT:     Head: Normocephalic and atraumatic.     Nose: Nose normal.     Mouth/Throat:     Mouth: Mucous membranes are moist.  Eyes:     Pupils: Pupils are  equal, round, and reactive to light.  Cardiovascular:     Rate and Rhythm: Normal rate and regular rhythm.     Pulses: Normal pulses.     Heart sounds: Normal heart sounds.  Pulmonary:     Effort: Pulmonary effort is normal.     Breath sounds: Normal breath sounds.  Abdominal:     General: Abdomen is flat. Bowel sounds are normal.     Palpations: Abdomen is soft.  Skin:    General: Skin is warm.     Comments: Xerosis, bilateral lower extremities  Neurological:     General: No focal deficit present.     Mental Status: He is alert and oriented to person, place, and time.  Psychiatric:        Mood and Affect: Mood normal.        Behavior: Behavior normal.    Results for orders placed or performed in visit on 05/02/23  Lipid panel  Result Value Ref Range   Cholesterol, Total 111 100 - 199 mg/dL   Triglycerides 89 0 - 149 mg/dL   HDL 37 (L) >25 mg/dL   VLDL Cholesterol Cal 17 5 - 40 mg/dL   LDL Chol Calc (NIH)  57 0 - 99 mg/dL   Chol/HDL Ratio 3.0 0.0 - 5.0 ratio  CBC no Diff  Result Value Ref Range   WBC 7.0 3.4 - 10.8 x10E3/uL   RBC 5.09 4.14 - 5.80 x10E6/uL   Hemoglobin 14.6 13.0 - 17.7 g/dL   Hematocrit 64.4 03.4 - 51.0 %   MCV 90 79 - 97 fL   MCH 28.7 26.6 - 33.0 pg   MCHC 32.0 31.5 - 35.7 g/dL   RDW 74.2 (L) 59.5 - 63.8 %   Platelets 311 150 - 450 x10E3/uL  Basic metabolic panel  Result Value Ref Range   Glucose 150 (H) 70 - 99 mg/dL   BUN 11 8 - 27 mg/dL   Creatinine, Ser 7.56 0.76 - 1.27 mg/dL   eGFR 98 >43 PI/RJJ/8.84   BUN/Creatinine Ratio 13 10 - 24   Sodium 138 134 - 144 mmol/L   Potassium 3.9 3.5 - 5.2 mmol/L   Chloride 101 96 - 106 mmol/L   CO2 21 20 - 29 mmol/L   Calcium 10.1 8.6 - 10.2 mg/dL    Last metabolic panel Lab Results  Component Value Date   GLUCOSE 150 (H) 05/02/2023   NA 138 05/02/2023   K 3.9 05/02/2023   CL 101 05/02/2023   CO2 21 05/02/2023   BUN 11 05/02/2023   CREATININE 0.87 05/02/2023   EGFR 98 05/02/2023   CALCIUM 10.1  05/02/2023   PROT 8.2 (H) 11/18/2022   ALBUMIN 4.4 11/18/2022   BILITOT 1.7 (H) 11/18/2022   ALKPHOS 55 11/18/2022   AST 27 11/18/2022   ALT 24 11/18/2022   ANIONGAP 9 11/19/2022      The ASCVD Risk score (Arnett DK, et al., 2019) failed to calculate for the following reasons:   The patient has a prior MI or stroke diagnosis    Assessment & Plan:   Problem List Items Addressed This Visit       Cardiovascular and Mediastinum   Benign hypertension    Patient's BP 112/78. Denies CP, heart palpitations, headache, or vision changes. CV exam unremarkable. Patient reports taking coreg 6.25 mg BID, farxiga 10 mg daily, entresto 49-51 BID, and 1/2 of spironolactone 25 mg. Of note, patient was told to take entresto 24-29 mg BID, discontinue spironolactone, and continue bumex 0.5 mg daily at his hospital discharge May 2024 and [facility] visit June 2024.  - Will need to call patient at home to clarify medications and doses to ensure patient is on the correct regimen       Chronic HFrEF (heart failure with reduced ejection fraction) [facility])    Patient with Echo from May 2024 showing LVEF 35-40%. Denies any dyspnea or swelling, physical exam only showing dry skin on bilateral lower extremities. Patient taking medications consistently, reports no issues. As noted in HTN section above, patient not taking medications the way they were prescribed following his hospitalization in May. Will need to follow up with patient to review lab results as well as medications that he has at home.       Relevant Orders   Basic metabolic panel (Completed)     Respiratory   Pulmonary nodule    No concerns on recent CT Angio Chest from 11/2022. Patient denied SOP or CP. History of tobacco use, still smoking 3-4 cigarettes a week, has tried Chantix, potentially planning on quitting by the end of the year. Will follow up with low dose CT in one year, and will check in with patient regarding quit date  at next visit.          Other   Hyperlipidemia - Primary    Patient with history of TIA, taking atorvastatin 80 mg daily for secondary prevention. Tolerating well.  - Continue taking atorvastatin 80 mg daily  - Repeat lipid panel today       Relevant Orders   Lipid panel (Completed)   Colon cancer screening    Patient is due for FIT testing, given kit a last clinic visit but did not turn it in.  - Reordered FIT testing, will follow up as needed      Cocaine abuse [facility])    Patient with history of cocaine use disorder. Reports he last used it one month ago. Uses cocaine recreationally around friends and family members. Discussed the possible effect of cocaine on his health, especially his heart, and the dangers of smoking or snorting cocaine that may be laced with other substances. Patient acknowledged understanding, will consider cessation.  - Follow up at next visit, provide ongoing counseling and support to help patient abstain from cocaine use      Other Visit Diagnoses     Health care maintenance       Relevant Orders   CBC no Diff (Completed)   POC FIT Test   Screening for colon cancer       Relevant Orders   Fecal occult blood, imunochemical       Return in about 3 months (around 08/02/2023) for HTN, HFrEF, Substance use follow up .   Patient seen with Dr. Antony Contras  Casten Floren Colbert Coyer, MD

## 2023-05-03 NOTE — Assessment & Plan Note (Signed)
Patient with history of TIA, taking atorvastatin 80 mg daily for secondary prevention. Tolerating well.  - Continue taking atorvastatin 80 mg daily  - Repeat lipid panel today

## 2023-05-03 NOTE — Assessment & Plan Note (Signed)
No concerns on recent CT Angio Chest from 11/2022. Patient denied SOP or CP. History of tobacco use, still smoking 3-4 cigarettes a week, has tried Chantix, potentially planning on quitting by the end of the year. Will follow up with low dose CT in one year, and will check in with patient regarding quit date at next visit.

## 2023-05-03 NOTE — Assessment & Plan Note (Addendum)
Patient with Echo from May 2024 showing LVEF 35-40%. Denies any dyspnea or swelling, physical exam only showing dry skin on bilateral lower extremities. Patient taking medications consistently, reports no issues. As noted in HTN section above, patient not taking medications the way they were prescribed following his hospitalization in May. Will need to follow up with patient to review lab results as well as medications that he has at home.

## 2023-05-03 NOTE — Assessment & Plan Note (Addendum)
Patient with history of cocaine use disorder. Reports he last used it one month ago. Uses cocaine recreationally around friends and family members. Discussed the possible effect of cocaine on his health, especially his heart, and the dangers of smoking or snorting cocaine that may be laced with other substances. Patient acknowledged understanding, will consider cessation.  - Follow up at next visit, provide ongoing counseling and support to help patient abstain from cocaine use

## 2023-05-05 NOTE — Progress Notes (Signed)
Internal Medicine Clinic Attending  I was physically present during the key portions of the resident provided service and participated in the medical decision making of patient's management care. I reviewed pertinent patient test results.  The assessment, diagnosis, and plan were formulated together and I agree with the documentation in the resident's note.  Reymundo Poll, MD

## 2023-05-09 ENCOUNTER — Other Ambulatory Visit: Payer: Self-pay

## 2023-05-09 DIAGNOSIS — I10 Essential (primary) hypertension: Secondary | ICD-10-CM

## 2023-05-09 DIAGNOSIS — I429 Cardiomyopathy, unspecified: Secondary | ICD-10-CM

## 2023-05-09 DIAGNOSIS — Z720 Tobacco use: Secondary | ICD-10-CM

## 2023-05-09 DIAGNOSIS — I5021 Acute systolic (congestive) heart failure: Secondary | ICD-10-CM

## 2023-05-10 MED ORDER — VARENICLINE TARTRATE 1 MG PO TABS: 1.0000 mg | ORAL_TABLET | Freq: Two times a day (BID) | ORAL | 5 refills | Status: DC

## 2023-05-10 MED ORDER — CARVEDILOL 6.25 MG PO TABS: 6.2500 mg | ORAL_TABLET | Freq: Two times a day (BID) | ORAL | 3 refills | Status: DC

## 2023-05-12 DIAGNOSIS — Z419 Encounter for procedure for purposes other than remedying health state, unspecified: Secondary | ICD-10-CM | POA: Diagnosis not present

## 2023-05-26 ENCOUNTER — Telehealth: Payer: Self-pay | Admitting: Student

## 2023-05-26 NOTE — Telephone Encounter (Signed)
Pt states he is out of the following medication since yesterday:  sacubitril-valsartan (ENTRESTO) 24-26 MG   [id number] - [place], [place] - [address] AT [address]

## 2023-05-29 ENCOUNTER — Other Ambulatory Visit: Payer: Self-pay | Admitting: Student

## 2023-05-29 ENCOUNTER — Other Ambulatory Visit (HOSPITAL_COMMUNITY): Payer: Self-pay

## 2023-05-29 DIAGNOSIS — I5022 Chronic systolic (congestive) heart failure: Secondary | ICD-10-CM

## 2023-05-29 DIAGNOSIS — I10 Essential (primary) hypertension: Secondary | ICD-10-CM

## 2023-05-29 MED ORDER — ENTRESTO 24-26 MG PO TABS
1.0000 | ORAL_TABLET | Freq: Two times a day (BID) | ORAL | 3 refills | Status: DC
Start: 2023-05-29 — End: 2023-06-13
  Filled 2023-05-29: qty 180, 90d supply, fill #0

## 2023-05-30 ENCOUNTER — Other Ambulatory Visit (HOSPITAL_COMMUNITY): Payer: Self-pay

## 2023-06-11 DIAGNOSIS — Z419 Encounter for procedure for purposes other than remedying health state, unspecified: Secondary | ICD-10-CM | POA: Diagnosis not present

## 2023-06-12 ENCOUNTER — Encounter (HOSPITAL_COMMUNITY): Payer: Self-pay

## 2023-06-12 ENCOUNTER — Encounter: Payer: BC Managed Care – PPO | Admitting: Internal Medicine

## 2023-06-12 ENCOUNTER — Emergency Department (HOSPITAL_COMMUNITY): Payer: Medicaid Other

## 2023-06-12 ENCOUNTER — Other Ambulatory Visit: Payer: Self-pay

## 2023-06-12 ENCOUNTER — Observation Stay (HOSPITAL_COMMUNITY): Payer: Medicaid Other

## 2023-06-12 ENCOUNTER — Observation Stay (HOSPITAL_COMMUNITY)
Admission: EM | Admit: 2023-06-12 | Discharge: 2023-06-13 | Disposition: A | Discharge status: Home / Self Care | Payer: Medicaid Other | Attending: Internal Medicine | Admitting: Internal Medicine

## 2023-06-12 DIAGNOSIS — Z79899 Other long term (current) drug therapy: Secondary | ICD-10-CM | POA: Insufficient documentation

## 2023-06-12 DIAGNOSIS — I2510 Atherosclerotic heart disease of native coronary artery without angina pectoris: Secondary | ICD-10-CM | POA: Insufficient documentation

## 2023-06-12 DIAGNOSIS — I5022 Chronic systolic (congestive) heart failure: Secondary | ICD-10-CM | POA: Insufficient documentation

## 2023-06-12 DIAGNOSIS — H532 Diplopia: Secondary | ICD-10-CM

## 2023-06-12 DIAGNOSIS — I110 Hypertensive heart disease with heart failure: Secondary | ICD-10-CM | POA: Diagnosis not present

## 2023-06-12 DIAGNOSIS — R29818 Other symptoms and signs involving the nervous system: Secondary | ICD-10-CM | POA: Diagnosis not present

## 2023-06-12 DIAGNOSIS — Z7982 Long term (current) use of aspirin: Secondary | ICD-10-CM | POA: Diagnosis not present

## 2023-06-12 DIAGNOSIS — N179 Acute kidney failure, unspecified: Secondary | ICD-10-CM | POA: Diagnosis not present

## 2023-06-12 DIAGNOSIS — Z87891 Personal history of nicotine dependence: Secondary | ICD-10-CM | POA: Insufficient documentation

## 2023-06-12 DIAGNOSIS — Z7984 Long term (current) use of oral hypoglycemic drugs: Secondary | ICD-10-CM | POA: Diagnosis not present

## 2023-06-12 DIAGNOSIS — I6782 Cerebral ischemia: Secondary | ICD-10-CM | POA: Diagnosis not present

## 2023-06-12 DIAGNOSIS — R9431 Abnormal electrocardiogram [ECG] [EKG]: Secondary | ICD-10-CM | POA: Diagnosis not present

## 2023-06-12 DIAGNOSIS — H5122 Internuclear ophthalmoplegia, left eye: Secondary | ICD-10-CM | POA: Insufficient documentation

## 2023-06-12 HISTORY — DX: Diplopia: H53.2

## 2023-06-12 LAB — BASIC METABOLIC PANEL
Anion gap: 10 (ref 5–15)
BUN: 14 mg/dL (ref 8–23)
CO2: 19 mmol/L — ABNORMAL LOW (ref 22–32)
Calcium: 9.7 mg/dL (ref 8.9–10.3)
Chloride: 106 mmol/L (ref 98–111)
Creatinine, Ser: 0.98 mg/dL (ref 0.61–1.24)
GFR, Estimated: >60 mL/min (ref >60)
Glucose, Bld: 143 mg/dL — ABNORMAL HIGH (ref 70–99)
Potassium: 3.8 mmol/L (ref 3.5–5.1)
Sodium: 135 mmol/L (ref 135–145)

## 2023-06-12 LAB — CBC
HCT: 47.5 % (ref 39.0–52.0)
Hemoglobin: 15.4 g/dL (ref 13.0–17.0)
MCH: 28.8 pg (ref 26.0–34.0)
MCHC: 32.4 g/dL (ref 30.0–36.0)
MCV: 88.8 fL (ref 80.0–100.0)
Platelets: 323 10*3/uL (ref 150–400)
RBC: 5.35 MIL/uL (ref 4.22–5.81)
RDW: 12.5 % (ref 11.5–15.5)
WBC: 5.5 10*3/uL (ref 4.0–10.5)
nRBC: 0 % (ref 0.0–0.2)

## 2023-06-12 LAB — URINALYSIS, ROUTINE W REFLEX MICROSCOPIC
Bacteria, UA: NONE SEEN
Bilirubin Urine: NEGATIVE
Glucose, UA: >=500 mg/dL — AB
Ketones, ur: NEGATIVE mg/dL
Leukocytes,Ua: NEGATIVE
Nitrite: NEGATIVE
Protein, ur: NEGATIVE mg/dL
Specific Gravity, Urine: 1.028 (ref 1.005–1.030)
pH: 5 (ref 5.0–8.0)

## 2023-06-12 LAB — TSH: TSH: 2.198 u[IU]/mL (ref 0.350–4.500)

## 2023-06-12 MED ORDER — CARVEDILOL 6.25 MG PO TABS
6.2500 mg | ORAL_TABLET | Freq: Two times a day (BID) | ORAL | Status: DC
  Administered 2023-06-12 – 2023-06-13 (×2): 6.25 mg via ORAL
  Filled 2023-06-12 (×2): qty 1

## 2023-06-12 MED ORDER — SACUBITRIL-VALSARTAN 24-26 MG PO TABS
1.0000 | ORAL_TABLET | Freq: Two times a day (BID) | ORAL | Status: DC
  Administered 2023-06-12 – 2023-06-13 (×2): 1 via ORAL
  Filled 2023-06-12 (×2): qty 1

## 2023-06-12 MED ORDER — NICOTINE 7 MG/24HR TD PT24: 7.0000 mg | MEDICATED_PATCH | Freq: Every day | TRANSDERMAL | Status: DC | PRN

## 2023-06-12 MED ORDER — ATORVASTATIN CALCIUM 80 MG PO TABS
80.0000 mg | ORAL_TABLET | Freq: Every day | ORAL | Status: DC
  Administered 2023-06-13: 80 mg via ORAL
  Filled 2023-06-12: qty 1

## 2023-06-12 MED ORDER — STROKE: EARLY STAGES OF RECOVERY BOOK
Freq: Once | Status: AC
  Filled 2023-06-12: qty 1

## 2023-06-12 MED ORDER — DAPAGLIFLOZIN PROPANEDIOL 10 MG PO TABS
10.0000 mg | ORAL_TABLET | Freq: Every day | ORAL | Status: DC
  Administered 2023-06-13: 10 mg via ORAL
  Filled 2023-06-12: qty 1

## 2023-06-12 MED ORDER — SPIRONOLACTONE 12.5 MG HALF TABLET
12.5000 mg | ORAL_TABLET | Freq: Every day | ORAL | Status: DC
  Administered 2023-06-13: 12.5 mg via ORAL
  Filled 2023-06-12: qty 1

## 2023-06-12 MED ORDER — CLOPIDOGREL BISULFATE 75 MG PO TABS
75.0000 mg | ORAL_TABLET | Freq: Every day | ORAL | Status: DC
  Administered 2023-06-12 – 2023-06-13 (×2): 75 mg via ORAL
  Filled 2023-06-12 (×2): qty 1

## 2023-06-12 MED ORDER — ASPIRIN 81 MG PO CHEW
81.0000 mg | CHEWABLE_TABLET | Freq: Every day | ORAL | Status: DC
  Administered 2023-06-12 – 2023-06-13 (×2): 81 mg via ORAL
  Filled 2023-06-12 (×2): qty 1

## 2023-06-12 MED ORDER — IOHEXOL 350 MG/ML SOLN
75.0000 mL | Freq: Once | INTRAVENOUS | Status: AC | PRN
  Administered 2023-06-12: 75 mL via INTRAVENOUS

## 2023-06-12 MED ORDER — ENOXAPARIN SODIUM 40 MG/0.4ML IJ SOSY
40.0000 mg | PREFILLED_SYRINGE | INTRAMUSCULAR | Status: DC
  Administered 2023-06-12: 40 mg via SUBCUTANEOUS
  Filled 2023-06-12: qty 0.4

## 2023-06-12 NOTE — ED Notes (Signed)
Patient transported to MRI 

## 2023-06-12 NOTE — Consult Note (Signed)
NEUROLOGY CONSULT NOTE   Date of service: June 12, 2023 Patient Name: Sean Leblanc MRN:  [id number] DOB:  10/07/1960 Chief Complaint: "Dizziness and horizontal diplopia" Requesting Provider: Rexford Maus, DO  History of Present Illness  Sean Leblanc is a 62 y.o. male with history of smoking, cocaine use, marijuana use, CHF, hypertension, hyperlipidemia, cardiomyopathy, alcohol abuse, non-STEMI and TIA who presents with diplopia and dizziness that have been present since Saturday morning.  Patient states that on Friday he went out to celebrate his birthday and used alcohol, cocaine and marijuana.  When he woke up on Saturday, he had diplopia and dizziness.  He initially thought that he was hung over and rested during the day on Saturday, but symptoms were still present Sunday and Monday.  Patient reports that he has double vision with both eyes open and with only left eye open it is somewhat blurry but that his vision is normal with only his right eyes open.  Diplopia is horizontal in nature, and he also noted that his eyes are not moving in tandem.  Patient reports that dizziness is present whether he is standing, sitting or lying down.  He reports that a few weeks ago he had a stomach upset with diarrhea but is having no other symptoms at this point.  LKW: 11/29 Modified rankin score: 0-Completely asymptomatic and back to baseline post- stroke IV Thrombolysis: No, outside of window EVT: No, outside of window  NIHSS components Score: Comment  1a Level of Conscious 0[x] 1[] 2[] 3[]     1b LOC Questions 0[x] 1[] 2[]      1c LOC Commands 0[x] 1[] 2[]      2 Best Gaze 0[x] 1[] 2[]      3 Visual 0[x] 1[] 2[] 3[]     4 Facial Palsy 0[x] 1[] 2[] 3[]     5a Motor Arm - left 0[x] 1[] 2[] 3[] 4[] UN[]   5b Motor Arm - Right 0[x] 1[] 2[] 3[] 4[] UN[]   6a Motor Leg - Left 0[x] 1[] 2[] 3[] 4[] UN[]   6b Motor Leg - Right 0[x] 1[] 2[] 3[] 4[] UN[]   7 Limb Ataxia 0[x] 1[] 2[] 3[] UN[]     8 Sensory 0[x] 1[] 2[] UN[]     9 Best Language 0[x] 1[] 2[] 3[]     10 Dysarthria 0[x] 1[] 2[] UN[]     11  Extinct. and Inattention 0[x]  1[]  2[]       TOTAL:0       ROS  Comprehensive ROS performed and pertinent positives documented in HPI   Past History   Past Medical History:  Diagnosis Date   Abnormal EKG    Anginal equivalent (HCC)    Atherosclerosis of aorta (HCC)    Cardiomyopathy (HCC)    CHF (congestive heart failure) (HCC)    Cocaine use 11/19/2022   Continuous dependence on cigarette smoking    Coronary artery spasm (HCC) 02/26/2021   Coronary atherosclerosis due to calcified coronary lesion    Dyspnea on exertion    ETOH abuse 11/19/2022   Hyperlipidemia    Hypertension    Hypertensive urgency 03/04/2016   Inclusion cyst 07/08/2020   Marijuana use 11/19/2022   Nonischemic cardiomyopathy (HCC) 11/19/2022   Nonobstructive atherosclerosis of coronary artery    NSTEMI (non-ST elevated myocardial infarction) (HCC)    Orthostatic hypotension 11/18/2022   TIA (transient ischemic attack)     Past Surgical History:  Procedure Laterality Date  LEFT HEART CATH AND CORONARY ANGIOGRAPHY N/A 02/26/2021   Procedure: LEFT HEART CATH AND CORONARY ANGIOGRAPHY;  Surgeon: Yates Decamp, MD;  Location: [facility];  Service: Cardiovascular;  Laterality: N/A;    Family History: Family History  Problem Relation Age of Onset   Heart attack Mother    Cancer Father     Social History  reports that he has quit smoking. His smoking use included cigarettes. He has a 4 pack-year smoking history. He has never used smokeless tobacco. He reports current alcohol use. He reports that he does not currently use drugs after having used the following drugs: Marijuana and Codeine.  No Known Allergies  Medications  No current facility-administered medications for this encounter.  Current Outpatient Medications:    ENTRESTO 49-51 MG, Take 1 tablet by mouth 2 (two) times daily.,  Disp: , Rfl:    spironolactone (ALDACTONE) 25 MG tablet, Take 12.5 mg by mouth daily., Disp: , Rfl:    aspirin EC 81 MG tablet, Take 1 tablet (81 mg total) by mouth daily. Swallow whole., Disp: 30 tablet, Rfl: 11   atorvastatin (LIPITOR) 80 MG tablet, Take 1 tablet (80 mg total) by mouth daily., Disp: 90 tablet, Rfl: 3   bumetanide (BUMEX) 1 MG tablet, Take 1 tablet (1mg ) by mouth daily AS NEEDED for weight gain >3 lbs or leg swelling., Disp: 30 tablet, Rfl: 0   carvedilol (COREG) 6.25 MG tablet, Take 1 tablet (6.25 mg total) by mouth 2 (two) times daily., Disp: 120 tablet, Rfl: 3   dapagliflozin propanediol (FARXIGA) 10 MG TABS tablet, TAKE 1 TABLET(10 MG) BY MOUTH DAILY, Disp: 90 tablet, Rfl: 3   sacubitril-valsartan (ENTRESTO) 24-26 MG, Take 1 tablet by mouth 2 (two) times daily. Hold if your systolic BP is < 295 mm Hg, Disp: 180 tablet, Rfl: 3   varenicline (CHANTIX) 1 MG tablet, Take 1 tablet (1 mg total) by mouth 2 (two) times daily., Disp: 60 tablet, Rfl: 5  Vitals   Vitals:   06/12/23 1538 06/12/23 1539 06/12/23 1545 06/12/23 1600  BP:   (!) 135/115 (!) 131/100  Pulse: 79 72 73 (!) 59  Resp:   18 18  Temp:      TempSrc:      SpO2: 100% 100% 100% 100%  Weight:      Height:        Body mass index is 19.94 kg/m.  Physical Exam   Constitutional: Appears well-developed and well-nourished.  Psych: Affect appropriate to situation.  Eyes: No scleral injection.  HENT: No OP obstruction.  Head: Normocephalic.  Cardiovascular: Normal rate and regular rhythm.  Respiratory: Effort normal, non-labored breathing.  Skin: WDI.   Neurologic Examination    NEURO:  Mental Status: Alert and oriented to person, place, time and situation, able to give clear and coherent history of present illness Speech/Language: speech is without dysarthria or aphasia.   Cranial Nerves:  II: PERRL. Visual fields full.  III, IV, VI: Left INO with restricted medial left eye movement, horizontal diplopia  worsening on right gaze. However, there is also subtle direction changing nystagmus on EMOI testing V: Sensation is intact to light touch and temperature and symmetrical to face.  VII: Smile is symmetrical.  VIII: hearing intact to voice. IX, X: Phonation is normal.  MW:UXLKGMWN shrug 5/5. XII: tongue is midline without fasciculations. Motor: 5/5 strength to all muscle groups tested.  Tone: is normal and bulk is normal Sensation- Intact to light touch bilaterally. Extinction absent to light  touch to DSS. Coordination: FTN intact bilaterally, HKS: no ataxia in BLE.No drift.   Gait- deferred   Labs/Imaging/Neurodiagnostic studies   CBC:  Recent Labs  Lab 2023/07/03 1214  WBC 5.5  HGB 15.4  HCT 47.5  MCV 88.8  PLT 323   Basic Metabolic Panel:  Lab Results  Component Value Date   NA 135 2023-07-03   K 3.8 07-03-2023   CO2 19 (L) 07-03-2023   GLUCOSE 143 (H) 03-Jul-2023   BUN 14 Jul 03, 2023   CREATININE 0.98 03-Jul-2023   CALCIUM 9.7 2023/07/03   GFRNONAA >60 2023-07-03   GFRAA 84 10/01/2019   Lipid Panel:  Lab Results  Component Value Date   LDLCALC 57 05/02/2023   HgbA1c:  Lab Results  Component Value Date   HGBA1C 5.0 02/26/2021   Urine Drug Screen:     Component Value Date/Time   LABOPIA NONE DETECTED 11/18/2022 1029   COCAINSCRNUR POSITIVE (A) 11/18/2022 1029   LABBENZ NONE DETECTED 11/18/2022 1029   AMPHETMU NONE DETECTED 11/18/2022 1029   THCU POSITIVE (A) 11/18/2022 1029   LABBARB NONE DETECTED 11/18/2022 1029    Alcohol Level No results found for: "ETH" INR  Lab Results  Component Value Date   INR 0.97 03/03/2016   APTT  Lab Results  Component Value Date   APTT 31 03/03/2016    CT angio Head and Neck with contrast(Personally reviewed): Pending  MRI Brain(Personally reviewed): Negative for acute abnormalities  ASSESSMENT   Sean Leblanc is a 62 y.o. male  with history of smoking, cocaine use, marijuana use, CHF, hypertension,  hyperlipidemia, cardiomyopathy, alcohol abuse, non-STEMI and TIA who presents with dizziness and horizontal diplopia which has been present since Saturday morning when he woke up.  On Friday night, patient went out to celebrate his birthday and used alcohol, cocaine and marijuana.  He woke up on Saturday morning with dizziness and horizontal diplopia and at first it seemed that he had a hangover.  He rested on Saturday and Sunday, but when symptoms persisted on Monday he sought assistance for them.  Dizziness persists when patient is lying or standing or sitting.  Diplopia is horizontal in nature and goes away when patient closes his left eye but is present still when he closes his right eye intermittently vs. Blurry vision.  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  However if monocular nature of diplopia persists/recurrs will also need ophthalmology evaluation.  Will admit patient for stroke workup. Discussed substance use with patient and counseled him strongly to avoid cocaine and marijuana in the future.  Patient is in agreement with this.  He also states that he has quit smoking he does not intend to start again.  RECOMMENDATIONS  Stroke/TIA Workup - Admit for stroke workup - Outside of permissive hypertension window, maintain normotension - CTA head and neck - TTE - Check A1c and LDL + add statin per guidelines - HIV / RPR for other treatable risk factors (endorses risky sexual behavior as well) - Aspirin 81 mg daily and Plavix 75 mg daily antiplt/anticoag - q4 hr neuro checks - STAT head CT for any change in neuro exam - Tele - PT/OT/SLP - Stroke education - Amb referral to neurology upon discharge   -Ophthalmology evaluation for monocular diplopia in left eye if presistent ______________________________________________________________________  Patient seen by NP and then by MD, MD  to edit note as needed.  Signed,  Cortney E Ernestina Columbia, NP [facility]   Attending Neurologist's note:  I personally saw this patient, gathering history, performing a full neurologic examination, reviewing relevant labs, personally reviewing relevant imaging including MRI brain, and formulated the assessment and plan, adding the note above for completeness and clarity to accurately reflect my thoughts  Brooke Dare MD-PhD [facility] [phone] Available 7 AM to 7 PM, outside these hours please contact Neurologist on call listed on AMION

## 2023-06-12 NOTE — ED Provider Triage Note (Signed)
Emergency Medicine Provider Triage Evaluation Note  Sean Leblanc , a 62 y.o. male  was evaluated in triage.  Pt complains of double vision x 2 days or so. Also having lightheadedness and dizziness upon standing. LNK Saturday @ 1100  Review of Systems  Positive: As above, eye changes Negative:   Physical Exam  BP (!) 123/112   Pulse 82   Temp 97.7 F (36.5 C) (Oral)   Resp 16   Ht 6' (1.829 m)   Wt 66.7 kg   SpO2 100%   BMI 19.94 kg/m  Gen:   Awake, no distress   Resp:  Normal effort  MSK:   Moves extremities without difficulty  Other:  Left eye disconjugate gaze, difficulty with looking medially and down, PERRL  Medical Decision Making  Medically screening exam initiated at 12:11 PM.  Appropriate orders placed.  DRAELYN SERVICE was informed that the remainder of the evaluation will be completed by another provider, this initial triage assessment does not replace that evaluation, and the importance of remaining in the ED until their evaluation is complete.  Workup initiated   Valborg Friar T, PA-C 06/12/23 1214

## 2023-06-12 NOTE — Plan of Care (Signed)
  Problem: Education: Goal: Knowledge of disease or condition will improve Outcome: Progressing   Problem: Education: Goal: Knowledge of secondary prevention will improve (MUST DOCUMENT ALL) Outcome: Progressing   Problem: Education: Goal: Knowledge of patient specific risk factors will improve Loraine Leriche N/A or DELETE if not current risk factor) Outcome: Progressing   Problem: Ischemic Stroke/TIA Tissue Perfusion: Goal: Complications of ischemic stroke/TIA will be minimized Outcome: Progressing   Problem: Self-Care: Goal: Ability to participate in self-care as condition permits will improve Outcome: Progressing   Problem: Education: Goal: Knowledge of General Education information will improve Description: Including pain rating scale, medication(s)/side effects and non-pharmacologic comfort measures Outcome: Progressing   Problem: Coping: Goal: Level of anxiety will decrease Outcome: Progressing   Problem: Safety: Goal: Ability to remain free from injury will improve Outcome: Progressing   Problem: Skin Integrity: Goal: Risk for impaired skin integrity will decrease Outcome: Progressing

## 2023-06-12 NOTE — H&P (Signed)
Date: 06/12/2023               Patient Name:  Sean Leblanc MRN: [id number]  DOB: 1960/12/04 Age / Sex: 62 y.o., male   PCP: Annett Fabian, MD         Medical Service: Internal Medicine Teaching Service         Attending Physician: Dr. Mercie Eon, MD    First Contact: Dr. Philomena Doheny, MD Pager: [phone]  Second Contact: Dr. Rocky Morel, DO  Pager: [phone]       After Hours (After 5p/  First Contact Pager: [phone]  weekends / holidays): Second Contact Pager: [phone]   Chief Complaint: double vision   History of Present Illness:   Sean Leblanc is a 62 yo male with past medical history significant for HFrEF (LVEF 35-40% May 2024), non-STEMI (02/2021), TIA (2017), HTN, HLD, CAD, tobacco use, and substance use who presents for double vision concerning for stroke.   History obtained from patient. He states he celebrated his birthday on Friday with friends by drinking alcohol, smoking marijuana (1-2 joints), and snorting cocaine. Unsure if other substance mixed in with cocaine. Has not used alcohol, cocaine, or marijuana since Friday, last cocaine use prior to this was around September. Woke up on Saturday feeling like he was hungover, tried to take it easy, didn't feel well, felt like he may be having double vision and experienced dizziness with standing and ambulation. By Sunday, patient looked in the mirror and noticed eyes seemed different, especially the left eye. Tried going into work today but double vision prevented him from walking straight, spoke with his niece who recommended coming into the ED, took the bus here. States double vision improves by covering his left eye, vision in right eye seems to be at baseline. Left eye has been tearing up some, denies discharge. Patient most worried about his ability to go back to his baseline as double vision will impact his ability to work.   Denies recent illnesses other than some loose stools this morning, no one  else who used cocaine on Friday is sick. Endorses gait instability due to dizziness but denies weakness or falls. No chest pain, SOB, headache, speech changes, hearing changes, confusion, N/V, or issues with voiding. Has been eating and drinking normally, denies issues with swallowing. Has been taking his home medications without issue, last took his medications this morning.   Confirmed full code status with patient, as well as niece Sean Leblanc as his surrogate Management consultant.   ED course:  - Patient arrived to ED by bus, afebrile, BP 123/112, HR 82, RR 16; intermittently elevated BP up to 175/119, mostly 120s-130s/90s-100s - Neurology consulted  - MRI and CTA completed - Given clopidogrel/plavix 75 mg and aspirin 81 mg   Meds:  Confirmed with patient in the room, last taken this morning.  Current Meds  Medication Sig   aspirin EC 81 MG tablet Take 1 tablet (81 mg total) by mouth daily. Swallow whole.   atorvastatin (LIPITOR) 80 MG tablet Take 1 tablet (80 mg total) by mouth daily.   carvedilol (COREG) 6.25 MG tablet Take 1 tablet (6.25 mg total) by mouth 2 (two) times daily.   dapagliflozin propanediol (FARXIGA) 10 MG TABS tablet TAKE 1 TABLET(10 MG) BY MOUTH DAILY   sacubitril-valsartan (ENTRESTO) 24-26 MG Take 1 tablet by mouth 2 (two) times daily. Hold if your systolic BP is < 295 mm Hg   spironolactone (ALDACTONE) 25 MG tablet  Take 12.5 mg by mouth daily.   varenicline (CHANTIX) 1 MG tablet Take 1 tablet (1 mg total) by mouth 2 (two) times daily.   Allergies: Allergies as of 06/12/2023   (No Known Allergies)   Past Medical History:  Diagnosis Date   Abnormal EKG    Anginal equivalent (HCC)    Atherosclerosis of aorta (HCC)    Cardiomyopathy (HCC)    CHF (congestive heart failure) (HCC)    Cocaine use 11/19/2022   Continuous dependence on cigarette smoking    Coronary artery spasm (HCC) 02/26/2021   Coronary atherosclerosis due to calcified coronary lesion    Dyspnea on  exertion    ETOH abuse 11/19/2022   Hyperlipidemia    Hypertension    Hypertensive urgency 03/04/2016   Inclusion cyst 07/08/2020   Marijuana use 11/19/2022   Nonischemic cardiomyopathy (HCC) 11/19/2022   Nonobstructive atherosclerosis of coronary artery    NSTEMI (non-ST elevated myocardial infarction) (HCC)    Orthostatic hypotension 11/18/2022   TIA (transient ischemic attack)    Family History:  Per chart review-  - Heart attack in mother - Cancer in father  Social History:  - Lives alone - Independent with ADLs and IADLs - Some family support nearby including niece and sister - Works, boxes items and prints/places shipping labels - Tobacco use, decreased to 1-2 cigarettes daily down from 1 pack - Drinks 1-2 beers on the weekend  - Occasional marijuana use (smoked) - Occasional cocaine use (snorted) - Denies other illicit drug use   Review of Systems: A complete ROS was negative except as per HPI.   Physical Exam: Blood pressure (!) 162/105, pulse 72, temperature (!) 97.4 F (36.3 C), temperature source Oral, resp. rate 16, height 6' (1.829 m), weight 66.7 kg, SpO2 100%. General: Patient is resting in bed in no acute distress, asking and answering questions appropriately.  HENT: Atraumatic, normocephalic; PERRL, moist mucous membranes  CV: RRR, no r/m/g, intact bilateral radial pulses, no LE edema Pulmonary: Normal respiratory effort on room air Abdominal: Soft, non-tender, non-distended Neuro:  - Patient alert and oriented to self, place, situation, and date - Following directions accurately - No speech deficits or hearing deficits noted - Peripheral vision fields intact bilaterally  - Patient's left eye unable to adduct past midline on horizontal scan  - Patient with slight asymmetry in eyebrows (higher than right) at rest - Facial sensation intact  - Shoulder shrug 5/5 - Bilateral upper and lower extremity strength 5/5 MSK: Moving all limbs spontaneously, gait  unstable when both eyes open, gait steady when left eye is covered Psych: Normal mood and affect   Labs:     Latest Ref Rng & Units 06/12/2023   12:14 PM 05/02/2023    2:30 PM 11/19/2022    5:43 AM  BMP  Glucose 70 - 99 mg/dL 696  295  94   BUN 8 - 23 mg/dL 14  11  17    Creatinine 0.61 - 1.24 mg/dL 2.84  1.32  4.40   BUN/Creat Ratio 10 - 24  13    Sodium 135 - 145 mmol/L 135  138  136   Potassium 3.5 - 5.1 mmol/L 3.8  3.9  3.8   Chloride 98 - 111 mmol/L 106  101  104   CO2 22 - 32 mmol/L 19  21  23    Calcium 8.9 - 10.3 mg/dL 9.7  10.2  8.5        Latest Ref Rng & Units 06/12/2023  12:14 PM 05/02/2023    2:30 PM 11/19/2022    5:43 AM  CBC  WBC 4.0 - 10.5 K/uL 5.5  7.0  5.4   Hemoglobin 13.0 - 17.0 g/dL 64.4  03.4  74.2   Hematocrit 39.0 - 52.0 % 47.5  45.6  43.4   Platelets 150 - 400 K/uL 323  311  209    Urinalysis      Component Ref Range & Units 13:18 2 yr ago  Color, Urine YELLOW YELLOW YELLOW  APPearance CLEAR CLEAR CLEAR  Specific Gravity, Urine 1.005 - 1.030 1.028 1.014  pH 5.0 - 8.0 5.0 6.0  Glucose, UA NEGATIVE mg/dL >=595 Abnormal  NEGATIVE  Hgb urine dipstick NEGATIVE SMALL Abnormal  NEGATIVE  Bilirubin Urine NEGATIVE NEGATIVE NEGATIVE  Ketones, ur NEGATIVE mg/dL NEGATIVE NEGATIVE  Protein, ur NEGATIVE mg/dL NEGATIVE NEGATIVE  Nitrite NEGATIVE NEGATIVE NEGATIVE  Leukocytes,Ua NEGATIVE NEGATIVE NEGATIVE CM  RBC / HPF 0 - 5 RBC/hpf 0-5   WBC, UA 0 - 5 WBC/hpf 0-5   Bacteria, UA NONE SEEN NONE SEEN   Squamous Epithelial / HPF 0 - 5 /HPF 0-5   TSH - 2.198 WNL  Lipid panel - needs to be collected Hgb A1c - needs to be collected  EKG 12/2: personally reviewed my interpretation is NSR, QTc 397 ms, left ventricular hypertrophy.  MRI brain 12/2:  Brain: Negative for an acute infarct. No hemorrhage. No hydrocephalus. No extra-axial fluid collection. No mass effect. No mass. There is a background of moderate to severe chronic microvascular ischemic  change.   Vascular: Normal flow voids.   Skull and upper cervical spine: Normal marrow signal.   Sinuses/Orbits: No middle ear or mastoid effusion. Paranasal sinuses are clear. Orbits are unremarkable.   IMPRESSION: No acute intracranial process.  CT Angio head neck:  - In process  Echocardiogram:  - Ordered  Assessment & Plan by Problem: Principal Problem:   Diplopia  CAID HENLINE is a 62 yo male with past medical history significant for HFrEF (LVEF 35-40% May 2024), non-STEMI (02/2021), TIA (2017), HTN, HLD, CAD, tobacco use, and substance use who presents for double vision and admitted to the Internal Medicine Teaching Service for stroke workup.   Diplopia  Patient with about a 3 day history of double vision following substance use including cocaine, alcohol, and marijuana use. Patient does have a significant cardiovascular history as well as history of TIA. Neurology consulted in ED due to concerns for stroke, appreciate their recommendations. Neurology's exam consistent with present exam, including patient's left eye unable to adduct past midline, eyes not moving together in tandem. Denied any eye pain on exam. Gait unstable when left eye uncovered. No other acute deficits noted.   Etiology of left eye deficits concerning unclear at this time. Differential includes basilar stroke, infection, and structural/mass effects. Patient does endorse some loose stools this morning, denies any other acute illness and has been afebrile since admission. No lesions or concerns for infection on exam, difficult to determine if patient had recent viral illness that may have led to some of his present symptoms. MRI from admission without bleeding or masses. 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 Per neurology  recommendations, patient started on DAPT. Patient outside of permissive hypertension, will maintain normotension with home meds. Due to monocular involvement, will place ophthalmology referral  for further evaluation. Will also follow up on labs and imaging to complete stroke work up.  Plan:  - Follow up CTA head and neck - Follow up Echo  - Patient on telemetry  - Follow up Hgb A1c, lipid panel - Continue home atorvastatin 80 mg daily - Patient started on aspirin 81 mg daily and plavix 75 mg daily   - Q4H neuro checks - PT/OT eval and treat  - Follow up additional neurology recommendations  HTN Patient on home coreg 6.25 mg BID, entresto 49-51 BID, and spironolactone 12.5 mg daily. Denies any chest pain or headache. BP on admission mostly 120s-130s/90s-100s, intermittently elevated to 140s, up to 175/119. Resumed coreg and entresto on admission.   HFrEF  LVEF 35-40% May 2024. Patient on GDMT at home (coreg 6.25 mg BID, farxiga 10 mg daily, entresto 49-51 BID, and spironolactone 12.5 mg daily). Denies issues with medications. Denies SOB, no LE edema on physical exam. Resumed coreg and entresto today, will resume spironolactone and farxiga tomorrow.   HLD Last lipid panel with LDL 57 on 10/22. Patient takes atorvastatin 80 mg daily. Resumed here.   Substance use Cocaine, marijuana use Patient with occasional cocaine and marijuana use. Last smoked marijuana Friday. Last cocaine use Friday (snorted) and before that, last use was in September. Discussed effects of cocaine on health and risks of cocaine being laced with other substances. Will continue provide substance use counseling.   Tobacco use Patient working on decreasing daily use of tobacco, down to 1-2 cigarettes a day. Takes Chantix at home. Ordered nicotine patch if needed for cravings.   Diet Regular Code Status Full Code VTE prophylaxis sq Lovenox 40 mg   Dispo: Admit patient to Observation with expected length of stay less  than 2 midnights.  Signed: Philomena Doheny, MD 06/12/2023, 7:27 PM  After 5pm on weekdays and 1pm on weekends: On Call pager: [phone]

## 2023-06-12 NOTE — ED Notes (Signed)
ED TO INPATIENT HANDOFF REPORT  ED Nurse Name and Phone #:   S Name/Age/Gender Sean Leblanc 62 y.o. male Room/Bed: 011C/011C  Code Status   Code Status: Full Code  Home/SNF/Other Home Patient oriented to: self, place, time, and situation Is this baseline? Yes   Triage Complete: Triage complete  Chief Complaint Diplopia [H53.2]  Triage Note Pt c/o lightheadedness, double vision upon standing, pt has dizziness upon standing. Pt states when he lays down his dizziness and double vision goes away when he lays downx2d. Pt c/o left arm painx2d, but has resolved. Pt c/o right arm painx2d, but has resolved. 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 Pt states drank ETOH on Friday for his birhtday   Allergies No Known Allergies  Level of Care/Admitting Diagnosis ED Disposition     ED Disposition  Admit   Condition  --   Comment  Hospital Area: [facility] [[id number]]  Level of Care: Med-Surg [16]  May place patient in observation at [facility] or [facility] if equivalent level of care is available:: Yes  Covid Evaluation: Asymptomatic - no recent exposure (last 10 days) testing not required  Diagnosis: Diplopia [368.2.ICD-9-CM]  Admitting Physician: Mercie Eon [[id number]]  Attending Physician: Mercie Eon [[id number]]          B Medical/Surgery History Past Medical History:  Diagnosis Date   Abnormal EKG    Anginal equivalent (HCC)    Atherosclerosis of aorta (HCC)    Cardiomyopathy (HCC)    CHF (congestive heart failure) (HCC)    Cocaine use 11/19/2022   Continuous dependence on cigarette smoking    Coronary artery spasm (HCC) 02/26/2021   Coronary atherosclerosis due to calcified coronary lesion    Dyspnea on exertion    ETOH abuse 11/19/2022   Hyperlipidemia    Hypertension    Hypertensive urgency 03/04/2016   Inclusion cyst 07/08/2020   Marijuana use 11/19/2022   Nonischemic cardiomyopathy (HCC) 11/19/2022   Nonobstructive atherosclerosis  of coronary artery    NSTEMI (non-ST elevated myocardial infarction) (HCC)    Orthostatic hypotension 11/18/2022   TIA (transient ischemic attack)    Past Surgical History:  Procedure Laterality Date   LEFT HEART CATH AND CORONARY ANGIOGRAPHY N/A 02/26/2021   Procedure: LEFT HEART CATH AND CORONARY ANGIOGRAPHY;  Surgeon: Yates Decamp, MD;  Location: [facility] INVASIVE CV LAB;  Service: Cardiovascular;  Laterality: N/A;     A IV Location/Drains/Wounds Patient Lines/Drains/Airways Status     Active Line/Drains/Airways     Name Placement date Placement time Site Days   Peripheral IV 06/12/23 20 G Right Antecubital 06/12/23  1528  Antecubital  less than 1            Intake/Output Last 24 hours No intake or output data in the 24 hours ending 06/12/23 1738  Labs/Imaging Results for orders placed or performed during the hospital encounter of 06/12/23 (from the past 48 hour(s))  Basic metabolic panel     Status: Abnormal   Collection Time: 06/12/23 12:14 PM  Result Value Ref Range   Sodium 135 135 - 145 mmol/L   Potassium 3.8 3.5 - 5.1 mmol/L   Chloride 106 98 - 111 mmol/L   CO2 19 (L) 22 - 32 mmol/L   Glucose, Bld 143 (H) 70 - 99 mg/dL    Comment: Glucose reference range applies only to samples taken after fasting for at least 8 hours.   BUN 14 8 - 23 mg/dL   Creatinine, Ser 1.91 0.61 -  1.24 mg/dL   Calcium 9.7 8.9 - 16.1 mg/dL   GFR, Estimated >09 >60 mL/min    Comment: (NOTE) Calculated using the CKD-EPI Creatinine Equation (2021)    Anion gap 10 5 - 15    Comment: Performed at [facility], [address] [place], [place] [address]  CBC     Status: None   Collection Time: 06/12/23 12:14 PM  Result Value Ref Range   WBC 5.5 4.0 - 10.5 K/uL   RBC 5.35 4.22 - 5.81 MIL/uL   Hemoglobin 15.4 13.0 - 17.0 g/dL   HCT 81.1 91.4 - 78.2 %   MCV 88.8 80.0 - 100.0 fL   MCH 28.8 26.0 - 34.0 pg   MCHC 32.4 30.0 - 36.0 g/dL   RDW 95.6 21.3 - 08.6 %   Platelets 323 150 - 400 K/uL    nRBC 0.0 0.0 - 0.2 %    Comment: Performed at [facility], [address] [place], [place] [address]  Urinalysis, Routine w reflex microscopic -Urine, Clean Catch     Status: Abnormal   Collection Time: 06/12/23  1:18 PM  Result Value Ref Range   Color, Urine YELLOW YELLOW   APPearance CLEAR CLEAR   Specific Gravity, Urine 1.028 1.005 - 1.030   pH 5.0 5.0 - 8.0   Glucose, UA >=500 (A) NEGATIVE mg/dL   Hgb urine dipstick SMALL (A) NEGATIVE   Bilirubin Urine NEGATIVE NEGATIVE   Ketones, ur NEGATIVE NEGATIVE mg/dL   Protein, ur NEGATIVE NEGATIVE mg/dL   Nitrite NEGATIVE NEGATIVE   Leukocytes,Ua NEGATIVE NEGATIVE   RBC / HPF 0-5 0 - 5 RBC/hpf   WBC, UA 0-5 0 - 5 WBC/hpf   Bacteria, UA NONE SEEN NONE SEEN   Squamous Epithelial / HPF 0-5 0 - 5 /HPF    Comment: Performed at [facility], [address], [place], [place] [address]   MR BRAIN WO CONTRAST  Result Date: 06/12/2023 CLINICAL DATA:  Neuro deficit, acute, stroke suspected EXAM: MRI HEAD WITHOUT CONTRAST TECHNIQUE: Multiplanar, multiecho pulse sequences of the brain and surrounding structures were obtained without intravenous contrast. COMPARISON:  Brain MR 03/04/2019 FINDINGS: Brain: Negative for an acute infarct. No hemorrhage. No hydrocephalus. No extra-axial fluid collection. No mass effect. No mass. There is a background of moderate to severe chronic microvascular ischemic change. Vascular: Normal flow voids. Skull and upper cervical spine: Normal marrow signal. Sinuses/Orbits: No middle ear or mastoid effusion. Paranasal sinuses are clear. Orbits are unremarkable. Other: None. IMPRESSION: No acute intracranial process. Electronically Signed   By: Lorenza Cambridge M.D.   On: 06/12/2023 14:27    Pending Labs Unresulted Labs (From admission, onward)     Start     Ordered   06/13/23 0500  Lipid panel  (Labs)  Tomorrow morning,   R       Comments: Fasting    06/12/23 1646   06/13/23 0500  Hemoglobin A1c  (Labs)   Tomorrow morning,   R       Comments: To assess prior glycemic control    06/12/23 1646            Vitals/Pain Today's Vitals   06/12/23 1545 06/12/23 1600 06/12/23 1609 06/12/23 1641  BP: (!) 135/115 (!) 131/100    Pulse: 73 (!) 59    Resp: 18 18    Temp:    98.3 F (36.8 C)  TempSrc:    Oral  SpO2: 100% 100%  Weight:      Height:      PainSc:   0-No pain     Isolation Precautions No active isolations  Medications Medications   stroke: early stages of recovery book (has no administration in time range)  aspirin chewable tablet 81 mg (81 mg Oral Given 06/12/23 1708)  clopidogrel (PLAVIX) tablet 75 mg (75 mg Oral Given 06/12/23 1708)  atorvastatin (LIPITOR) tablet 80 mg (has no administration in time range)  enoxaparin (LOVENOX) injection 40 mg (has no administration in time range)  nicotine (NICODERM CQ - dosed in mg/24 hr) patch 7 mg (has no administration in time range)    Mobility walks     Focused Assessments Neuro Assessment Handoff:  Swallow screen pass? Yes    NIH Stroke Scale  Dizziness Present: Yes Headache Present: No Interval: Initial Level of Consciousness (1a.)   : Alert, keenly responsive LOC Questions (1b. )   : Answers both questions correctly LOC Commands (1c. )   : Performs both tasks correctly Best Gaze (2. )  : Normal Visual (3. )  : Complete hemianopia Facial Palsy (4. )    : Normal symmetrical movements Motor Arm, Left (5a. )   : No drift Motor Arm, Right (5b. ) : No drift Motor Leg, Left (6a. )  : No drift Motor Leg, Right (6b. ) : No drift Limb Ataxia (7. ): Absent Sensory (8. )  : Normal, no sensory loss Best Language (9. )  : No aphasia Dysarthria (10. ): Normal Extinction/Inattention (11.)   : No Abnormality Complete NIHSS TOTAL: 2     Neuro Assessment:   Neuro Checks:   Initial (06/12/23 1646)  Has TPA been given? No If patient is a Neuro Trauma and patient is going to OR before floor call report to 4N Charge nurse:  [phone] or [phone]   R Recommendations: See Admitting Provider Note  Report given to:   Additional Notes:

## 2023-06-12 NOTE — ED Provider Notes (Signed)
[place] EMERGENCY DEPARTMENT AT [facility] Provider Note   CSN: [id number] Arrival date & time: 06/12/23  1150     History  No chief complaint on file.   Sean Leblanc is a 62 y.o. male complaining of double vision x 2 days. Woke up Saturday morning around 1100 AM and noticed his vision was blurry and his eyes looked "different". Having associated lightheadedness and dizziness. Initially had some tingling in his hands but this has resolved. Denies any head trauma. Patient did drink some alcohol Friday night and going to bed that night is his LNK.   HPI     Home Medications Prior to Admission medications   Medication Sig Start Date End Date Taking? Authorizing Provider  aspirin EC 81 MG tablet Take 1 tablet (81 mg total) by mouth daily. Swallow whole. 03/05/21  Yes Tolia, Sunit, DO  atorvastatin (LIPITOR) 80 MG tablet Take 1 tablet (80 mg total) by mouth daily. 01/06/23 01/06/24 Yes Briscoe Burns, MD  carvedilol (COREG) 6.25 MG tablet Take 1 tablet (6.25 mg total) by mouth 2 (two) times daily. 05/10/23  Yes Annett Fabian, MD  dapagliflozin propanediol (FARXIGA) 10 MG TABS tablet TAKE 1 TABLET(10 MG) BY MOUTH DAILY 11/07/22  Yes Briscoe Burns, MD  sacubitril-valsartan (ENTRESTO) 24-26 MG Take 1 tablet by mouth 2 (two) times daily. Hold if your systolic BP is < 409 mm Hg 05/29/23  Yes Annett Fabian, MD  spironolactone (ALDACTONE) 25 MG tablet Take 12.5 mg by mouth daily. 02/08/23  Yes [provider]  varenicline (CHANTIX) 1 MG tablet Take 1 tablet (1 mg total) by mouth 2 (two) times daily. 05/10/23  Yes Annett Fabian, MD  ENTRESTO 49-51 MG Take 1 tablet by mouth 2 (two) times daily. New dose, Patient hasn't picked up from Pharmacy. 06/12/2023. 06/06/23   [provider]      Allergies    Patient has no known allergies.    Review of Systems   Review of Systems  Eyes:  Positive for visual disturbance.  Neurological:  Positive for dizziness  and light-headedness.  All other systems reviewed and are negative.   Physical Exam Updated Vital Signs BP (!) 162/105 (BP Location: Right Arm)   Pulse 72   Temp (!) 97.4 F (36.3 C) (Oral)   Resp 16   Ht 6' (1.829 m)   Wt 66.7 kg   SpO2 100%   BMI 19.94 kg/m  Physical Exam Vitals and nursing note reviewed.  Constitutional:      Appearance: Normal appearance.  HENT:     Head: Normocephalic and atraumatic.  Eyes:     General: Lids are normal.     Extraocular Movements:     Left eye: Abnormal extraocular motion present.     Conjunctiva/sclera: Conjunctivae normal.     Pupils: Pupils are equal, round, and reactive to light.     Comments: Left eye disconjugate gaze, left eye elevated compared to right, impaired downward and medial left EOM  Cardiovascular:     Rate and Rhythm: Normal rate and regular rhythm.  Pulmonary:     Effort: Pulmonary effort is normal. No respiratory distress.     Breath sounds: Normal breath sounds.  Abdominal:     General: There is no distension.     Palpations: Abdomen is soft.     Tenderness: There is no abdominal tenderness.  Skin:    General: Skin is warm and dry.  Neurological:     General: No focal deficit  present.     Mental Status: He is alert.     ED Results / Procedures / Treatments   Labs (all labs ordered are listed, but only abnormal results are displayed) Labs Reviewed  BASIC METABOLIC PANEL - Abnormal; Notable for the following components:      Result Value   CO2 19 (*)    Glucose, Bld 143 (*)    All other components within normal limits  URINALYSIS, ROUTINE W REFLEX MICROSCOPIC - Abnormal; Notable for the following components:   Glucose, UA >=500 (*)    Hgb urine dipstick SMALL (*)    All other components within normal limits  CBC  TSH  LIPID PANEL  HEMOGLOBIN A1C  CBC  BASIC METABOLIC PANEL    EKG EKG Interpretation Date/Time:  Monday June 12 2023 12:21:24 EST Ventricular Rate:  74 PR Interval:  154 QRS  Duration:  90 QT Interval:  358 QTC Calculation: 397 R Axis:   8  Text Interpretation: Normal sinus rhythm Biatrial enlargement Left ventricular hypertrophy ( Sokolow-Lyon , Cornell product ) Nonspecific T wave abnormality Abnormal ECG Previous inferior T wave inversions now upright compared to prior EKG Confirmed by Elayne Snare ([id number]) on 06/12/2023 5:00:56 PM  Radiology MR BRAIN WO CONTRAST  Result Date: 06/12/2023 CLINICAL DATA:  Neuro deficit, acute, stroke suspected EXAM: MRI HEAD WITHOUT CONTRAST TECHNIQUE: Multiplanar, multiecho pulse sequences of the brain and surrounding structures were obtained without intravenous contrast. COMPARISON:  Brain MR 03/04/2019 FINDINGS: Brain: Negative for an acute infarct. No hemorrhage. No hydrocephalus. No extra-axial fluid collection. No mass effect. No mass. There is a background of moderate to severe chronic microvascular ischemic change. Vascular: Normal flow voids. Skull and upper cervical spine: Normal marrow signal. Sinuses/Orbits: No middle ear or mastoid effusion. Paranasal sinuses are clear. Orbits are unremarkable. Other: None. IMPRESSION: No acute intracranial process. Electronically Signed   By: Lorenza Cambridge M.D.   On: 06/12/2023 14:27    Procedures Procedures    Medications Ordered in ED Medications   stroke: early stages of recovery book (has no administration in time range)  aspirin chewable tablet 81 mg (81 mg Oral Given 06/12/23 1708)  clopidogrel (PLAVIX) tablet 75 mg (75 mg Oral Given 06/12/23 1708)  atorvastatin (LIPITOR) tablet 80 mg (has no administration in time range)  enoxaparin (LOVENOX) injection 40 mg (has no administration in time range)  nicotine (NICODERM CQ - dosed in mg/24 hr) patch 7 mg (has no administration in time range)  carvedilol (COREG) tablet 6.25 mg (has no administration in time range)  dapagliflozin propanediol (FARXIGA) tablet 10 mg (has no administration in time range)  sacubitril-valsartan  (ENTRESTO) 24-26 mg per tablet (has no administration in time range)  spironolactone (ALDACTONE) tablet 12.5 mg (has no administration in time range)  iohexol (OMNIPAQUE) 350 MG/ML injection 75 mL (75 mLs Intravenous Contrast Given 06/12/23 1834)    ED Course/ Medical Decision Making/ A&P Clinical Course as of 06/12/23 1920  Mon Jun 12, 2023  1523 Consulted with neurologist Dr Iver Nestle, she recommends CTA head and neck and plans for neuro APP to see patient to determine disposition [LR]  1640 Neurology APP seen patient, recommends medical admission for stroke workup [LR]    Clinical Course User Index [LR] Boyd Litaker, Lora Paula, PA-C                                 Medical Decision Making Amount and/or Complexity of  Data Reviewed Radiology: ordered.   This patient is a 62 y.o. male  who presents to the ED for concern of double vision x 2 days.   Differential diagnoses prior to evaluation: The emergent differential diagnosis includes, but is not limited to, eye musculature dysfunction, cranial nerve palsy, brain stem or intracranial process (CVA, vertebral artery dissection, mass). This is not an exhaustive differential.   Past Medical History / Co-morbidities / Social History: Tobacco use, HTN, nonischemic cardiomyopathy, HLD, cocaine use, CHF (LVEF 35-40%, echo May 2024), CAD, NSTEMI, TIA  Physical Exam: Physical exam performed. The pertinent findings include: Normal vitals, no distress. L eye disconjugate gaze, L eye deviated upwards, difficulty with medial or downward EOM, pupils equal and reactive  Lab Tests/Imaging studies: I personally interpreted labs/imaging and the pertinent results include:  CBC normal. BMP unremarkable. UA with > 500 glucose, otherwise unremarkable.   MRI brain without acute findings. I agree with the radiologist interpretation.  Cardiac monitoring: EKG obtained and interpreted by myself and attending physician which shows: NSR, LVH, nonspecific T  abnormality   Consultations obtained: I consulted with neurologist Dr Iver Nestle who recommended CTA head and neck. Neuro APP evaluated patient at bedside and agrees with admission for CVA workup.   I consulted with internal medicine teaching service who will admit under attending Dr Lafonda Mosses.   Disposition: After consideration of the diagnostic results and the patients response to treatment, I feel that patient would benefit from admission for stroke workup.  I discussed this case with my attending physician Dr. Andria Meuse who cosigned this note including patient's presenting symptoms, physical exam, and planned diagnostics and interventions. Attending physician stated agreement with plan or made changes to plan which were implemented.   Final Clinical Impression(s) / ED Diagnoses Final diagnoses:  Monocular diplopia    Rx / DC Orders ED Discharge Orders     None      Portions of this report may have been transcribed using voice recognition software. Every effort was made to ensure accuracy; however, inadvertent computerized transcription errors may be present.    Su Monks, PA-C 06/12/23 1920    Anders Simmonds T, DO 06/13/23 [phone]

## 2023-06-12 NOTE — ED Triage Notes (Addendum)
Pt c/o lightheadedness, double vision upon standing, pt has dizziness upon standing. Pt states when he lays down his dizziness and double vision goes away when he lays downx2d. Pt c/o left arm painx2d, but has resolved. Pt c/o right arm painx2d, but has resolved. 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 Pt states drank ETOH on Friday for his birhtday

## 2023-06-12 NOTE — Progress Notes (Signed)
Pt arrived to 6 north room 19 alert and oriented x4. Patient ambulated from stretcher to bed with stand by assist. Pt did experience some dizziness. Bed in lowest position. Call light in reach. Bed alarm on. Will continue to monitor pt.

## 2023-06-12 NOTE — Hospital Course (Signed)
Sean Leblanc is a 62 yo male with past medical history significant for HFrEF (LVEF 35-40% May 2024), non-STEMI (02/2021), TIA (2017), HTN, HLD, CAD, tobacco use, and substance use who presents for double vision and admitted to the Internal Medicine Teaching Service on 12/1 for stroke workup.    Diplopia  Patient with a 3 day history of double vision following substance use including cocaine, alcohol, and marijuana use. Patient endorsed feeling unwell and like his walking was off balance, denied any speech changes or confusion. Patient has a significant cardiovascular history as well as history of TIA. Neurology consulted in ED due to concerns for stroke. Physical exam on admission notable for abnormal conjugate gaze to the right side, with the left eye unable to adduct and noticeable right eye nystagmus with abduction. Denied any eye pain on exam. Gait unstable when left eye uncovered, stable when left eye covered. No other acute deficits noted.   Admission CTA head and neck and MRI brain without evidence of bleeding, mass, or stroke. Did show chronic microvascular disease seen on prior imaging. Echocardiogram 12/3 with LVEF 35 to 40% with global hypokinesis, moderate concentric left ventricular hypertrophy, and grade 1 diastolic dysfunction, comparable to echocardiogram 11/2022.   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 Initial differential concerning for stroke, infection, autoimmune disease, and structural/mass deficits. Following lab and imaging workup etiology likely associated with patient's recent substance use (cocaine) and significant vascular disease. Per neurology recommendations, patient started on DAPT for 21 days followed by Plavix 75 mg daily indefinitely. Patient's home atorvastatin 80 mg daily was resumed.   On day of discharge, OT provided patient with left eye patch and glasses to help manage symptoms. Patient observed ambulating without issue when left eye  was covered, and patient also endorsed improvement in symptoms with patch. Team also informally consulted ophthalmology on day of discharge, who agreed with neurology's assessment and indicated patient's vision will likely improve over the next few months. Per ophthalmology, patient can be seen outpatient [facility]) if there is little improvement with accommodations, with possible surgery not indicated until after 6 months of symptoms presenting. No OT/PT/SLP or neurology follow up was indicated after discharge.    HTN Patient reports taking coreg 6.25 mg BID, entresto 49-51 BID, and spironolactone 12.5 mg daily at home. Denies any chest pain or headache. BP on admission mostly 120s-130s/90s-100s, intermittently elevated to 140s, up to 175/119. Resumed coreg and entresto on admission, then held entresto on day of discharge due to AKI.   AKI Patient's sCr on day of admission 0.98, then 1.45 on day of discharge. Discussed staying hydrated, need to stop Entresto, indications for calling [facility] should kidney function worsen, and instructions to return for lab visit at [facility] on 12/5 to check BMP. Patient will need to know if he can safely resume Entresto.    HFrEF  LVEF 35-40% May 2024, similar to current admission echocardiogram. Patient on GDMT at home (coreg 6.25 mg BID, farxiga 10 mg daily, entresto 49-51 BID, and spironolactone 12.5 mg daily). Questionable whether patient is supposed to be on lower dose entresto and whether he was supposed to stop spironolactone based on [facility] October and [facility] June visits. Denied issues with medications. Denied SOB, no LE edema on physical exam. Resumed all medications on discharge with the exception of entresto which was held due to AKI.   HLD Last lipid panel with LDL 57 on 10/22 and 76 on admission. Patient takes atorvastatin 80 mg daily. Resumed,  added Zetia 10 mg on day of discharge to help patient meet LDL goal < 70.     Substance use Cocaine, marijuana  use Patient with occasional cocaine and marijuana use. Last smoked marijuana and last cocaine use Friday (snorted). Last use of cocaine before that was in September. Discussed effects of cocaine on health and risks of cocaine being laced with other substances. Patient would benefit from continued substance use counseling outpatient.     Tobacco use Patient working on decreasing daily use of tobacco, down to 1-2 cigarettes a day. Takes Chantix at home. Ordered nicotine patch as needed for cravings. Resumed Chantix on discharge.

## 2023-06-13 ENCOUNTER — Observation Stay (HOSPITAL_BASED_OUTPATIENT_CLINIC_OR_DEPARTMENT_OTHER): Payer: Medicaid Other

## 2023-06-13 ENCOUNTER — Other Ambulatory Visit: Payer: Self-pay | Admitting: Student

## 2023-06-13 ENCOUNTER — Other Ambulatory Visit (HOSPITAL_COMMUNITY): Payer: Self-pay

## 2023-06-13 DIAGNOSIS — H5122 Internuclear ophthalmoplegia, left eye: Secondary | ICD-10-CM

## 2023-06-13 DIAGNOSIS — H532 Diplopia: Secondary | ICD-10-CM | POA: Diagnosis not present

## 2023-06-13 DIAGNOSIS — I5022 Chronic systolic (congestive) heart failure: Secondary | ICD-10-CM | POA: Diagnosis not present

## 2023-06-13 DIAGNOSIS — N179 Acute kidney failure, unspecified: Secondary | ICD-10-CM

## 2023-06-13 LAB — BASIC METABOLIC PANEL
Anion gap: 10 (ref 5–15)
BUN: 18 mg/dL (ref 8–23)
CO2: 21 mmol/L — ABNORMAL LOW (ref 22–32)
Calcium: 9.2 mg/dL (ref 8.9–10.3)
Chloride: 104 mmol/L (ref 98–111)
Creatinine, Ser: 1.45 mg/dL — ABNORMAL HIGH (ref 0.61–1.24)
GFR, Estimated: 54 mL/min — ABNORMAL LOW (ref >60)
Glucose, Bld: 91 mg/dL (ref 70–99)
Potassium: 3.8 mmol/L (ref 3.5–5.1)
Sodium: 135 mmol/L (ref 135–145)

## 2023-06-13 LAB — HIV ANTIBODY (ROUTINE TESTING W REFLEX): HIV Screen 4th Generation wRfx: NONREACTIVE

## 2023-06-13 LAB — ECHOCARDIOGRAM COMPLETE
AR max vel: 3.4 cm2
AV Area VTI: 3.14 cm2
AV Area mean vel: 3.09 cm2
AV Mean grad: 4 mm[Hg]
AV Peak grad: 7.1 mm[Hg]
Ao pk vel: 1.33 m/s
Area-P 1/2: 2.28 cm2
Height: 72 in
MV M vel: 4.71 m/s
MV Peak grad: 88.7 mm[Hg]
Radius: 0.4 cm
S' Lateral: 3.9 cm
Weight: 2352.75 [oz_av]

## 2023-06-13 LAB — CBC
HCT: 43.6 % (ref 39.0–52.0)
Hemoglobin: 14.2 g/dL (ref 13.0–17.0)
MCH: 28.3 pg (ref 26.0–34.0)
MCHC: 32.6 g/dL (ref 30.0–36.0)
MCV: 86.9 fL (ref 80.0–100.0)
Platelets: 283 10*3/uL (ref 150–400)
RBC: 5.02 MIL/uL (ref 4.22–5.81)
RDW: 12.7 % (ref 11.5–15.5)
WBC: 5.5 10*3/uL (ref 4.0–10.5)
nRBC: 0 % (ref 0.0–0.2)

## 2023-06-13 LAB — LIPID PANEL
Cholesterol: 120 mg/dL (ref 0–200)
HDL: 27 mg/dL — ABNORMAL LOW (ref >40)
LDL Cholesterol: 76 mg/dL (ref 0–99)
Total CHOL/HDL Ratio: 4.4 {ratio}
Triglycerides: 85 mg/dL (ref <150)
VLDL: 17 mg/dL (ref 0–40)

## 2023-06-13 LAB — HEMOGLOBIN A1C
Hgb A1c MFr Bld: 5.2 % (ref 4.8–5.6)
Mean Plasma Glucose: 102.54 mg/dL

## 2023-06-13 LAB — RPR: RPR Ser Ql: NONREACTIVE

## 2023-06-13 MED ORDER — RIVAROXABAN 10 MG PO TABS
10.0000 mg | ORAL_TABLET | Freq: Every day | ORAL | Status: DC
  Administered 2023-06-13: 10 mg via ORAL
  Filled 2023-06-13 (×2): qty 1

## 2023-06-13 MED ORDER — EZETIMIBE 10 MG PO TABS
10.0000 mg | ORAL_TABLET | Freq: Every day | ORAL | Status: DC
  Administered 2023-06-13: 10 mg via ORAL
  Filled 2023-06-13: qty 1

## 2023-06-13 MED ORDER — EZETIMIBE 10 MG PO TABS
10.0000 mg | ORAL_TABLET | Freq: Every day | ORAL | 3 refills | Status: DC
  Filled 2023-06-13: qty 30, 30d supply, fill #0

## 2023-06-13 MED ORDER — ASPIRIN 81 MG PO CHEW
81.0000 mg | CHEWABLE_TABLET | Freq: Every day | ORAL | 0 refills | Status: DC
  Filled 2023-06-13: qty 19, 19d supply, fill #0

## 2023-06-13 MED ORDER — CLOPIDOGREL BISULFATE 75 MG PO TABS
75.0000 mg | ORAL_TABLET | Freq: Every day | ORAL | 3 refills | Status: DC
  Filled 2023-06-13: qty 30, 30d supply, fill #0

## 2023-06-13 NOTE — Progress Notes (Addendum)
STROKE TEAM PROGRESS NOTE   BRIEF HPI [name] is a 62 y.o. male with past medical history significant for current smoker, cocaine use, marijuana use, CHF, hypertension, hyperlipidemia, alcohol abuse, cardiomyopathy, NSTEMI, TIA who presented 12/2 complaining of diplopia and dizziness that was present since Saturday morning.  The patient states that he went out on Friday to celebrate his birthday and endorsed using alcohol cocaine and marijuana.  The diplopia and dizziness was noticed upon awakening Saturday morning. MRI and CTA both negative for acute process or LVO.   NIH on Admission: 0  INTERIM HISTORY/SUBJECTIVE  Patient states that diplopia is better with patch used on left eye.  Currently working with OT in room and walking in hallway His diplopia is vertical binocular with left INO present.   Patient denies headache, dizziness, pain with eye movement.  OBJECTIVE  CBC    Component Value Date/Time   WBC 5.5 06/13/2023 0637   RBC 5.02 06/13/2023 0637   HGB 14.2 06/13/2023 0637   HGB 14.6 05/02/2023 1430   HCT 43.6 06/13/2023 0637   HCT 45.6 05/02/2023 1430   PLT 283 06/13/2023 0637   PLT 311 05/02/2023 1430   MCV 86.9 06/13/2023 0637   MCV 90 05/02/2023 1430   MCH 28.3 06/13/2023 0637   MCHC 32.6 06/13/2023 0637   RDW 12.7 06/13/2023 0637   RDW 11.5 (L) 05/02/2023 1430   LYMPHSABS 2.2 11/18/2022 0656   MONOABS 1.1 (H) 11/18/2022 0656   EOSABS 0.2 11/18/2022 0656   BASOSABS 0.1 11/18/2022 0656    BMET    Component Value Date/Time   NA 135 06/13/2023 0637   NA 138 05/02/2023 1430   K 3.8 06/13/2023 0637   CL 104 06/13/2023 0637   CO2 21 (L) 06/13/2023 0637   GLUCOSE 91 06/13/2023 0637   BUN 18 06/13/2023 0637   BUN 11 05/02/2023 1430   CREATININE 1.45 (H) 06/13/2023 0637   CALCIUM 9.2 06/13/2023 0637   EGFR 98 05/02/2023 1430   GFRNONAA 54 (L) 06/13/2023 0637    IMAGING past 24 hours CT Angio Head Neck W WO CM  Result Date: 06/12/2023 CLINICAL  DATA:  Diplopia EXAM: CT ANGIOGRAPHY HEAD AND NECK WITH AND WITHOUT CONTRAST TECHNIQUE: Multidetector CT imaging of the head and neck was performed using the standard protocol during bolus administration of intravenous contrast. Multiplanar CT image reconstructions and MIPs were obtained to evaluate the vascular anatomy. Carotid stenosis measurements (when applicable) are obtained utilizing NASCET criteria, using the distal internal carotid diameter as the denominator. RADIATION DOSE REDUCTION: This exam was performed according to the departmental dose-optimization program which includes automated exposure control, adjustment of the mA and/or kV according to patient size and/or use of iterative reconstruction technique. CONTRAST:  75mL OMNIPAQUE IOHEXOL 350 MG/ML SOLN COMPARISON:  CT head 03/03/2016, correlation is made with 06/12/2023 MRI head FINDINGS: CT HEAD FINDINGS Brain: No evidence of acute infarct, hemorrhage, mass, mass effect, or midline shift. No hydrocephalus or extra-axial fluid collection. Periventricular white matter changes, likely the sequela of chronic small vessel ischemic disease. Vascular: No hyperdense vessel. Skull: Negative for fracture or focal lesion. Sinuses/Orbits: No acute finding. Other: The mastoid air cells are well aerated. CTA NECK FINDINGS Aortic arch: Four-vessel arch, with the left vertebral artery originating from the aorta. Imaged portion shows no evidence of aneurysm or dissection. No significant stenosis of the major arch vessel origins. Right carotid system: No evidence of dissection, occlusion, or hemodynamically significant stenosis (greater than 50%). Left carotid  system: No evidence of dissection, occlusion, or hemodynamically significant stenosis (greater than 50%). Vertebral arteries: No evidence of dissection, occlusion, or hemodynamically significant stenosis (greater than 50%). Skeleton: No acute osseous abnormality. Degenerative changes in the cervical spine.  Other neck: No acute finding. Upper chest: No focal pulmonary opacity or pleural effusion. Emphysema. Review of the MIP images confirms the above findings CTA HEAD FINDINGS Anterior circulation: Both internal carotid arteries are patent to the termini, without significant stenosis. A1 segments patent. Normal anterior communicating artery. Mild stenosis in the right A3 (series 6, image 69). Anterior cerebral arteries are otherwise patent to their distal aspects without significant stenosis. No M1 stenosis or occlusion. MCA branches perfused to their distal aspects without significant stenosis. Posterior circulation: Vertebral arteries patent to the vertebrobasilar junction with mild stenosis proximally (series 6, image 136 and 132). Posterior inferior cerebellar arteries patent proximally. Basilar is diminutive but patent to its distal aspect without significant stenosis. Superior cerebellar arteries patent proximally. Patent, diminutive right P1. Aplastic left P1. Near fetal origin of the right PCA from the right posterior communicating artery. Fetal origin of the left PCA from the left posterior communicating artery, with mild stenosis in the mid left PCOM (series 6, image 101). Additional mild stenosis in the proximal and distal right and proximal left P2 segments bilaterally (series 6, image 95, 99, and 100). PCAs otherwise perfused to their distal aspects without significant stenosis. Venous sinuses: As permitted by contrast timing, patent. Anatomic variants: Fetal origin of the left PCA. Near fetal origin of the right PCA. No evidence of aneurysm or vascular malformation. Review of the MIP images confirms the above findings IMPRESSION: 1. No acute intracranial process. 2. No intracranial large vessel occlusion. Mild stenosis in the right A3, left PCOM, and bilateral P2 segments. 3. No hemodynamically significant stenosis in the neck. 4. Emphysema. Emphysema (ICD10-J43.9). Electronically Signed   By: Wiliam Ke M.D.   On: 06/12/2023 19:20    Vitals:   06/12/23 1851 06/13/23 0015 06/13/23 0615 06/13/23 0738  BP: (!) 162/105 124/82 105/74 113/75  Pulse: 72 64 64 (!) 56  Resp: 16 17 18 17   Temp: (!) 97.4 F (36.3 C) 98 F (36.7 C) 98 F (36.7 C)   TempSrc: Oral Oral Oral   SpO2: 100% 99% 99% 100%  Weight:      Height:         PHYSICAL EXAM General:  Alert, well-nourished, well-developed patient in no acute distress CV: Regular rate and rhythm on monitor Respiratory:  Regular, unlabored respirations on room air  NEURO:  Mental Status: AA&Ox3, patient is able to give clear and coherent history Speech/Language: speech is without dysarthria or aphasia.  Naming, repetition, fluency, and comprehension intact.  Cranial Nerves:  II: PERRL.   III, IV, VI: Left eye INO with decreased medial rectus movement.  Left eye exotropia.  No restriction of vertical gaze V: Sensation is intact to light touch and symmetrical to face.  VII: Face is symmetrical resting and smiling VIII: hearing intact to voice. IX, X: Palate elevates symmetrically. Phonation is normal.  ZO:[id number] shrug 5/5. XII: tongue is midline without fasciculations. Motor: 5/5 strength to all muscle groups tested.  Tone: is normal and bulk is normal Sensation- Intact to light touch bilaterally. Extinction absent to light touch to DSS.   Coordination: FTN intact bilaterally, HKS: no ataxia in BLE.No drift.  Gait- slight ataxia and slight balance issues seen, but patient does self-correct.   Most Recent NIH: 1  ASSESSMENT/PLAN  MRI negative small brainstem stroke Etiology: Likely due to microvascular disease CT head No acute abnormality. CTA head & neck  No LVO Mild stenosis right A3, left PCOM, bilateral P2 segments.   MRI   No acute intracranial process  2D Echo: PENDING LDL 76 HgbA1c 5.2 VTE prophylaxis - SCDs aspirin 81 mg daily prior to admission, now on aspirin 81 mg daily and clopidogrel 75 mg daily for 3  weeks and then Plavix alone. Therapy recommendations:  No follow up needed  Disposition:  home  Hypertension CHF Non-ischemic Cardiomyopathy CHF Coronary Artery Spasm Home meds: Carvedilol 6.25 mg, spironolactone 25 mg, Entresto Stable BP goal normotensive  Hyperlipidemia Home meds:  Lipitor 80mg , continue LDL 76, goal < 70 Add Zetia 10mg   Continue statin and Zetia at discharge  Diabetes type II, no history Home meds:  none HgbA1c 5.2, goal < 7.0  Tobacco Abuse Former cigarette smoker  Substance Abuse Patient uses cocaine, marijuana       Ready to quit? Yes TOC consult for cessation placed  Other Stroke Risk Factors ETOH use, advised to drink no more than 1-2 drink(s) a day  Hospital day # 0   Pt seen by Neuro NP/APP and later by MD. Note/plan to be edited by MD as needed.    Lynnae January, DNP, AGACNP-BC [facility] Please use AMION for contact information & EPIC for messaging.  STROKE MD NOTE :  I have personally obtained history,examined this patient, reviewed notes, independently viewed imaging studies, participated in medical decision making and plan of care.ROS completed by me personally and pertinent positives fully documented  I have made any additions or clarifications directly to the above note. Agree with note above.  Patient presented with sudden onset of vertical diplopia and some dizziness and neurological exam shows left INO but MRI is negative for stroke.  Suspect small brainstem infarct not visualized on MRI due to small vessel disease.  Recommend left eye patch and dual antiplatelet therapy for 3 weeks followed by Plavix alone and aggressive risk factor modification.  Patient counseled to quit using cocaine, marijuana, alcohol and cigarettes.  Mobilize out of bed.  Therapy consults.  Continue ongoing stroke workup.  Greater than 50% time during this 50-minute visit was spent in counseling and coordination of care about his diplopia and  suspected brainstem stroke and discussion about stroke prevention and treatment and answering questions.  Delia Heady, MD Medical Director [facility] Pager: [phone] 06/13/2023 4:45 PM   To contact Stroke Continuity provider, please refer to [email or web address]. After hours, contact General Neurology

## 2023-06-13 NOTE — Progress Notes (Signed)
Bus pass provided, IV removed, and tele box returned. Escorted patient to the exit.

## 2023-06-13 NOTE — Progress Notes (Signed)
Pt stated that he needs to be discharged tomorrow morning. He needs a bus pass to get home because he doesn't have a ride. Social workers are gone for the day.

## 2023-06-13 NOTE — Progress Notes (Signed)
Discharge instructions given to pt. Pt verbalized understanding of all teaching and had no further questions. Discharge meds delivered to patient room. Work note printed and given to pt.

## 2023-06-13 NOTE — Discharge Summary (Signed)
Name: Sean Leblanc MRN: [id number] DOB: 1960/12/21 62 y.o. PCP: Annett Fabian, MD  Date of Admission: 06/12/2023 11:56 AM Date of Discharge: 06/13/2023 8:19 PM Attending Physician: Dr. Lafonda Mosses  Discharge Diagnosis: Principal Problem:   Diplopia Active Problems:   INO (internuclear ophthalmoplegia), left    Discharge Medications: Allergies as of 06/13/2023   No Known Allergies      Medication List     STOP taking these medications    aspirin EC 81 MG tablet Replaced by: Aspirin Low Dose 81 MG chewable tablet   Entresto 24-26 MG Generic drug: sacubitril-valsartan   Entresto 49-51 MG Generic drug: sacubitril-valsartan       TAKE these medications    Aspirin Low Dose 81 MG chewable tablet Generic drug: aspirin Chew 1 tablet (81 mg total) by mouth daily. Start taking on: June 14, 2023 Replaces: aspirin EC 81 MG tablet   atorvastatin 80 MG tablet Commonly known as: Lipitor Take 1 tablet (80 mg total) by mouth daily.   carvedilol 6.25 MG tablet Commonly known as: COREG Take 1 tablet (6.25 mg total) by mouth 2 (two) times daily.   clopidogrel 75 MG tablet Commonly known as: PLAVIX Take 1 tablet (75 mg total) by mouth daily. Start taking on: June 14, 2023   dapagliflozin propanediol 10 MG Tabs tablet Commonly known as: Farxiga TAKE 1 TABLET(10 MG) BY MOUTH DAILY   ezetimibe 10 MG tablet Commonly known as: ZETIA Take 1 tablet (10 mg total) by mouth daily.   spironolactone 25 MG tablet Commonly known as: ALDACTONE Take 12.5 mg by mouth daily.   varenicline 1 MG tablet Commonly known as: CHANTIX Take 1 tablet (1 mg total) by mouth 2 (two) times daily.        Disposition and follow-up:   [name] was discharged from [facility] in Stable condition.  At the hospital follow up visit please address:  1.  Follow-up:  Diplopia, left INO  - Patient's double vision improved with eye patch on left eye, also  provided with glasses by OT. Please follow up how patient is doing and if he is taking DAPT (12/2-12/23) as well as reminder to stop aspirin after 19 doses and continue on Plavix 75 mg daily indefinitely. He rides the bus, does not drive, but was concerned about vision and his ability to perform his work duties adequately. Discussed how vision will likely improve with time, but patient will need close follow up in clinic to determine if he needs to be seen by ophthalmology outpatient.   Substance use - Counseled patient on need to abstain from substance use as cocaine use may be reason for current vision deficit. Please follow up with patient and determine if he needs more support to quit.   AKI - Patient sCr elevated to 1.45 from 0.98 on day of discharge. Discontinued Entresto, recommended good hydration, and instructed patient to come in for a lab visit on 12/5 for BMP. Please follow up if sCr back to baseline and if patient is able to resume Entresto.   Antihypertensive regimen - Patient seen at [facility] on 10/22 where he was unsure if he was taking Entresto 49-51 mg BID or Entresto 24-29 mg BID. Previously told at [facility] visit to take lower dose of Entresto and to discontinue spironolactone. Please clarify what medications patient is taking and discuss best regimen for BP control.  HLD - Patient's LDL 76 on admission. On home atorvastatin 80 mg daily, added ezetimibe/Zetia  10 mg at discharge to help with LDL goal < 70.  2.  Labs / imaging needed at time of follow-up: BMP (12/5), BMP (12/19)  3.  Pending labs/ test needing follow-up: None  4.  Medication Changes  STOPPED  - Entresto    ADDED  - Plavix 75 mg daily   - Zetia 10 mg daily    MODIFIED  - n/a  Follow-up Appointments: - Lab visit at [facility] for BMP (Cr check) on 12/5 - Hospitalization follow-up at [facility] with Dr. Merrilee Jansky on 12/19 - Consider follow-up with [facility] [phone]) if little to no improvement in 2-3  months  Hospital Course by problem list: KASE MUETH is a 62 yo male with past medical history significant for HFrEF (LVEF 35-40% May 2024), non-STEMI (02/2021), TIA (2017), HTN, HLD, CAD, tobacco use, and substance use who presents for double vision and admitted to the Internal Medicine Teaching Service on 12/1 for stroke workup.    Diplopia  Patient with a 3 day history of double vision following substance use including cocaine, alcohol, and marijuana use. Patient endorsed feeling unwell and like his walking was off balance, denied any speech changes or confusion. Patient has a significant cardiovascular history as well as history of TIA. Neurology consulted in ED due to concerns for stroke. Physical exam on admission notable for abnormal conjugate gaze to the right side, with the left eye unable to adduct and noticeable right eye nystagmus with abduction. Denied any eye pain on exam. Gait unstable when left eye uncovered, stable when left eye covered. No other acute deficits noted.   Admission CTA head and neck and MRI brain without evidence of bleeding, mass, or stroke. Did show chronic microvascular disease seen on prior imaging. Echocardiogram 12/3 with LVEF 35 to 40% with global hypokinesis, moderate concentric left ventricular hypertrophy, and grade 1 diastolic dysfunction, comparable to echocardiogram 11/2022.   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 Initial differential concerning for stroke, infection, autoimmune disease, and structural/mass deficits. Following lab and imaging workup etiology likely associated with patient's recent substance use (cocaine) and significant vascular disease. Per neurology recommendations, patient started on DAPT for 21 days followed by Plavix 75 mg daily indefinitely. Patient's home atorvastatin 80 mg daily was resumed.   On day of discharge, OT provided patient with left eye patch and glasses to help manage symptoms. Patient  observed ambulating without issue when left eye was covered, and patient also endorsed improvement in symptoms with patch. Team also informally consulted ophthalmology on day of discharge, who agreed with neurology's assessment and indicated patient's vision will likely improve over the next few months. Per ophthalmology, patient can be seen outpatient [facility]) if there is little improvement with accommodations, with possible surgery not indicated until after 6 months of symptoms presenting. No OT/PT/SLP or neurology follow up was indicated after discharge.    HTN Patient reports taking coreg 6.25 mg BID, entresto 49-51 BID, and spironolactone 12.5 mg daily at home. Denies any chest pain or headache. BP on admission mostly 120s-130s/90s-100s, intermittently elevated to 140s, up to 175/119. Resumed coreg and entresto on admission, then held entresto on day of discharge due to AKI.   AKI Patient's sCr on day of admission 0.98, then 1.45 on day of discharge. Discussed staying hydrated, need to stop Entresto, indications for calling [facility] should kidney function worsen, and instructions to return for lab visit at [facility] on 12/5 to check BMP. Patient will need to know if he  can safely resume Entresto.    HFrEF  LVEF 35-40% May 2024, similar to current admission echocardiogram. Patient on GDMT at home (coreg 6.25 mg BID, farxiga 10 mg daily, entresto 49-51 BID, and spironolactone 12.5 mg daily). Questionable whether patient is supposed to be on lower dose entresto and whether he was supposed to stop spironolactone based on [facility] October and [facility] June visits. Denied issues with medications. Denied SOB, no LE edema on physical exam. Resumed all medications on discharge with the exception of entresto which was held due to AKI.   HLD Last lipid panel with LDL 57 on 10/22 and 76 on admission. Patient takes atorvastatin 80 mg daily. Resumed, added Zetia 10 mg on day of discharge to help patient meet LDL goal  < 70.     Substance use Cocaine, marijuana use Patient with occasional cocaine and marijuana use. Last smoked marijuana and last cocaine use Friday (snorted). Last use of cocaine before that was in September. Discussed effects of cocaine on health and risks of cocaine being laced with other substances. Patient would benefit from continued substance use counseling outpatient.     Tobacco use Patient working on decreasing daily use of tobacco, down to 1-2 cigarettes a day. Takes Chantix at home. Ordered nicotine patch as needed for cravings. Resumed Chantix on discharge.    Discharge Subjective: No overnight events reported. Patient with left eye patch on, observed ambulating around the room without issue, continues to endorse improvement in double vision and dizziness when left eye is covered. No new concerns. Discussed possible cause of eye deficit in association with substance use. patient acknowledged understanding of increased risk of CV and neurological events with substance use as well as his intention to abstain, especially from cocaine. Reviewed medication changes and upcoming lab and follow up appointments. Provided work note for patient.   Discharge Exam:   Blood pressure 113/75, pulse (!) 56, temperature 98 F (36.7 C), temperature source Oral, resp. rate 17, height 6' (1.829 m), weight 66.7 kg, SpO2 100%.  Constitutional: Well-appearing, left eye patch on, patient sitting in chair at bedside in no acute distress.  HENT: normocephalic atraumatic, mucous membranes moist Pulmonary/Chest: normal work of breathing on room air Neurological: alert & oriented, answering and asking questions appropriately;  - Peripheral vision fields normal bilaterally  - Conjugate gaze towards left normal, conjugate gaze towards right side with left eye unable to adduct past midline and right eye abduction nystagmus - No other deficits noted Psych: normal mood and affect  Pertinent Labs, Studies, and  Procedures:     Latest Ref Rng & Units 06/13/2023    6:37 AM 06/12/2023   12:14 PM 05/02/2023    2:30 PM  CBC  WBC 4.0 - 10.5 K/uL 5.5  5.5  7.0   Hemoglobin 13.0 - 17.0 g/dL 54.0  98.1  19.1   Hematocrit 39.0 - 52.0 % 43.6  47.5  45.6   Platelets 150 - 400 K/uL 283  323  311        Latest Ref Rng & Units 06/13/2023    6:37 AM 06/12/2023   12:14 PM 05/02/2023    2:30 PM  CMP  Glucose 70 - 99 mg/dL 91  478  295   BUN 8 - 23 mg/dL 18  14  11    Creatinine 0.61 - 1.24 mg/dL 6.21  3.08  6.57   Sodium 135 - 145 mmol/L 135  135  138   Potassium 3.5 - 5.1 mmol/L 3.8  3.8  3.9   Chloride 98 - 111 mmol/L 104  106  101   CO2 22 - 32 mmol/L 21  19  21    Calcium 8.9 - 10.3 mg/dL 9.2  9.7  47.4    RPR non-reactive HIV non-reactive Lipid panel LDL 76 Hgb A1c 5.2%  CT Angio Head Neck W WO CM  Result Date: 06/12/2023 CLINICAL DATA:  Diplopia EXAM: CT ANGIOGRAPHY HEAD AND NECK WITH AND WITHOUT CONTRAST TECHNIQUE: Multidetector CT imaging of the head and neck was performed using the standard protocol during bolus administration of intravenous contrast. Multiplanar CT image reconstructions and MIPs were obtained to evaluate the vascular anatomy. Carotid stenosis measurements (when applicable) are obtained utilizing NASCET criteria, using the distal internal carotid diameter as the denominator. RADIATION DOSE REDUCTION: This exam was performed according to the departmental dose-optimization program which includes automated exposure control, adjustment of the mA and/or kV according to patient size and/or use of iterative reconstruction technique. CONTRAST:  75mL OMNIPAQUE IOHEXOL 350 MG/ML SOLN COMPARISON:  CT head 03/03/2016, correlation is made with 06/12/2023 MRI head FINDINGS: CT HEAD FINDINGS Brain: No evidence of acute infarct, hemorrhage, mass, mass effect, or midline shift. No hydrocephalus or extra-axial fluid collection. Periventricular white matter changes, likely the sequela of chronic small  vessel ischemic disease. Vascular: No hyperdense vessel. Skull: Negative for fracture or focal lesion. Sinuses/Orbits: No acute finding. Other: The mastoid air cells are well aerated. CTA NECK FINDINGS Aortic arch: Four-vessel arch, with the left vertebral artery originating from the aorta. Imaged portion shows no evidence of aneurysm or dissection. No significant stenosis of the major arch vessel origins. Right carotid system: No evidence of dissection, occlusion, or hemodynamically significant stenosis (greater than 50%). Left carotid system: No evidence of dissection, occlusion, or hemodynamically significant stenosis (greater than 50%). Vertebral arteries: No evidence of dissection, occlusion, or hemodynamically significant stenosis (greater than 50%). Skeleton: No acute osseous abnormality. Degenerative changes in the cervical spine. Other neck: No acute finding. Upper chest: No focal pulmonary opacity or pleural effusion. Emphysema. Review of the MIP images confirms the above findings CTA HEAD FINDINGS Anterior circulation: Both internal carotid arteries are patent to the termini, without significant stenosis. A1 segments patent. Normal anterior communicating artery. Mild stenosis in the right A3 (series 6, image 69). Anterior cerebral arteries are otherwise patent to their distal aspects without significant stenosis. No M1 stenosis or occlusion. MCA branches perfused to their distal aspects without significant stenosis. Posterior circulation: Vertebral arteries patent to the vertebrobasilar junction with mild stenosis proximally (series 6, image 136 and 132). Posterior inferior cerebellar arteries patent proximally. Basilar is diminutive but patent to its distal aspect without significant stenosis. Superior cerebellar arteries patent proximally. Patent, diminutive right P1. Aplastic left P1. Near fetal origin of the right PCA from the right posterior communicating artery. Fetal origin of the left PCA from the  left posterior communicating artery, with mild stenosis in the mid left PCOM (series 6, image 101). Additional mild stenosis in the proximal and distal right and proximal left P2 segments bilaterally (series 6, image 95, 99, and 100). PCAs otherwise perfused to their distal aspects without significant stenosis. Venous sinuses: As permitted by contrast timing, patent. Anatomic variants: Fetal origin of the left PCA. Near fetal origin of the right PCA. No evidence of aneurysm or vascular malformation. Review of the MIP images confirms the above findings IMPRESSION: 1. No acute intracranial process. 2. No intracranial large vessel occlusion. Mild stenosis in the right A3, left PCOM, and bilateral P2  segments. 3. No hemodynamically significant stenosis in the neck. 4. Emphysema. Emphysema (ICD10-J43.9). Electronically Signed   By: Wiliam Ke M.D.   On: 06/12/2023 19:20   MR BRAIN WO CONTRAST  Result Date: 06/12/2023 CLINICAL DATA:  Neuro deficit, acute, stroke suspected EXAM: MRI HEAD WITHOUT CONTRAST TECHNIQUE: Multiplanar, multiecho pulse sequences of the brain and surrounding structures were obtained without intravenous contrast. COMPARISON:  Brain MR 03/04/2019 FINDINGS: Brain: Negative for an acute infarct. No hemorrhage. No hydrocephalus. No extra-axial fluid collection. No mass effect. No mass. There is a background of moderate to severe chronic microvascular ischemic change. Vascular: Normal flow voids. Skull and upper cervical spine: Normal marrow signal. Sinuses/Orbits: No middle ear or mastoid effusion. Paranasal sinuses are clear. Orbits are unremarkable. Other: None. IMPRESSION: No acute intracranial process. Electronically Signed   By: Lorenza Cambridge M.D.   On: 06/12/2023 14:27    Discharge Instructions: Discharge Instructions     Call MD for:   Complete by: As directed    Call MD for:   Complete by: As directed    Call MD for:  difficulty breathing, headache or visual disturbances    Complete by: As directed    Call MD for:  difficulty breathing, headache or visual disturbances   Complete by: As directed    Call MD for:  extreme fatigue   Complete by: As directed    Call MD for:  extreme fatigue   Complete by: As directed    Call MD for:  hives   Complete by: As directed    Call MD for:  hives   Complete by: As directed    Call MD for:  persistant dizziness or light-headedness   Complete by: As directed    Call MD for:  persistant dizziness or light-headedness   Complete by: As directed    Call MD for:  persistant nausea and vomiting   Complete by: As directed    Call MD for:  persistant nausea and vomiting   Complete by: As directed    Call MD for:  redness, tenderness, or signs of infection (pain, swelling, redness, odor or green/yellow discharge around incision site)   Complete by: As directed    Call MD for:  redness, tenderness, or signs of infection (pain, swelling, redness, odor or green/yellow discharge around incision site)   Complete by: As directed    Call MD for:  severe uncontrolled pain   Complete by: As directed    Call MD for:  severe uncontrolled pain   Complete by: As directed    Call MD for:  temperature >100.4   Complete by: As directed    Call MD for:  temperature >100.4   Complete by: As directed    Diet - low sodium heart healthy   Complete by: As directed    Diet - low sodium heart healthy   Complete by: As directed    Discharge instructions   Complete by: As directed    Patient instructions:  - You were seen for double vision and a condition called left internuclear ophthalmoplegia, which is a neurological condition that affects your ability to move both eyes together when looking to one side. We think it may have been caused by your recent substance use. Neurology has recommended that you take the following medications as part of your treatment:  o Aspirin 81 mg daily for 19 more doses  o Plavix/Clopidogrel 75 mg daily  (indefinitely) - You will follow up with neurology in the  outpatient setting. This appointment has been set up for you.  - We STRONGLY recommend that you stop any substance use, as it can significantly increase your risk of heart or neurological events.  - You were started on a new medication to help lower your cholesterol levels called Zetia/ezetimibe, which you will take daily. You will continue to take your atorvastatin 80 mg daily.  - You were found to have an elevated creatinine, which indicates you may have an acute kidney injury. This can be common in the hospital setting. We encourage you to stay hydrated with water and to STOP taking your medication called Entresto (sacubitril-valsartan) until we can check your creatinine level again.  - Please come into our clinic's lab (Internal Medicine Clinic at [facility]) this Thursday, December 5th, so we can check your creatinine level. We will call you with the results and if your kidney function is better we will tell you to resume your Entresto at that time. If you notice any changes in urination, such as decreased frequency, darkening of your urine, or pain with urination, please call our office at [phone].  We may advise you to come in for your follow up appointment earlier.  - We will set up a hospitalization follow up appointment at our Internal Medicine Clinic here at [facility] on December 19th at 1:15 PM with Dr. Merrilee Jansky. It is very important that you come to your appointment so you can discuss how your vision is going and how your medications are going.  - Please continue to wear your patch over your left eye to help with walking as well as the glasses that were provided to you. Your vision should improve over time. If you continue to have difficulties with your vision in a few months we can help refer you to ophthalmology, or the eye specialist, so they can provide more recommendations.  - If you have any new vision  changes, changes in speech, changes in your swallowing, a sharp headache, or chest pain please call 911 or go to your nearest emergency room for evaluation as these may be concerning for a stroke or heart attack.  - We are glad you are feeling better. It was a pleasure serving you during your stay. - Dr. Justin Mend and the Internal Medicine Teaching Service.   Discharge instructions   Complete by: As directed    Patient instructions:  - You were seen for double vision and a condition called left internuclear ophthalmoplegia, which is a neurological condition that affects your ability to move both eyes together when looking to one side. We think it may have been caused by your recent substance use. Neurology has recommended that you take the following medications as part of your treatment:   o Aspirin 81 mg daily for 19 more doses   o Plavix/Clopidogrel 75 mg daily (indefinitely) - You will follow up with neurology in the outpatient setting. This appointment has been set up for you.  - We STRONGLY recommend that you stop any substance use, as it can significantly increase your risk of heart or neurological events.  - You were started on a new medication to help lower your cholesterol levels called Zetia/ezetimibe, which you will take daily. You will continue to take your atorvastatin 80 mg daily.  - You were found to have an elevated creatinine, which indicates you may have an acute kidney injury. This can be common in the hospital setting. We encourage you to stay hydrated with water  and to STOP taking your medication called Entresto (sacubitril-valsartan) until we can check your creatinine level again.  - Please come into our clinic's lab (Internal Medicine Clinic at [facility]) this Thursday, December 5th, so we can check your creatinine level. We will call you with the results and if your kidney function is better we will tell you to resume your Entresto at that time. If you notice any changes  in urination, such as decreased frequency, darkening of your urine, or pain with urination, please call our office at [phone].  We may advise you to come in for your follow up appointment earlier.  - We will set up a hospitalization follow up appointment at our Internal Medicine Clinic here at [facility] on December 19th at 1:15 PM with Dr. Merrilee Jansky. It is very important that you come to your appointment so you can discuss how your vision is going and how your medications are going.  - Please continue to wear your patch over your left eye to help with walking as well as the glasses that were provided to you. Your vision should improve over time. If you continue to have difficulties with your vision in a few months we can help refer you to ophthalmology, or the eye specialist, so they can provide more recommendations.  - If you have any new vision changes, changes in speech, changes in your swallowing, a sharp headache, or chest pain please call 911 or go to your nearest emergency room for evaluation as these may be concerning for a stroke or heart attack.  - We are glad you are feeling better. It was a pleasure serving you during your stay. - Dr. Justin Mend and the Internal Medicine Teaching Service.   Increase activity slowly   Complete by: As directed    Increase activity slowly   Complete by: As directed       Signed: Twylia Oka Colbert Coyer, MD [facility] - PGY1 Pager: [phone] 06/13/2023, 8:19 PM    Please contact the on call pager after 5 pm and on weekends at [phone].

## 2023-06-13 NOTE — Progress Notes (Signed)
SLP Cancellation Note  Patient Details Name: DELANEY PARTSCH MRN: [id number] DOB: May 29, 1961   Cancelled treatment:       Reason Eval/Treat Not Completed: SLP screened, no needs identified, will sign off. Pt reports no acute concerns and participated in all screening activities without difficulty.    Gwynneth Aliment, M.A., CF-SLP Speech Language Pathology, Acute Rehabilitation Services  Secure Chat preferred [phone]  06/13/2023, 1:17 PM

## 2023-06-13 NOTE — Evaluation (Signed)
Occupational Therapy Evaluation/Discharge Patient Details Name: Sean Leblanc MRN: [id number] DOB: 08-Mar-1961 Today's Date: 06/13/2023   History of Present Illness 62 y.o. male complaining of double vision x 2 days. Woke up Saturday morning around 1100 AM and noticed his vision was blurry and his eyes looked "different". Reports drinking alcohol, smoking marijuana (1-2 joints), and snorting cocaine on Friday to celebrate his birthday.   Clinical Impression   Patient evaluated by Occupational Therapy with no further acute OT needs identified. All education has been completed and the patient has no further questions. Pt lives alone and is independent with all ADL tasks and functional mobility. Pt is currently employed. Pt provided with eye patch to wear over left eye which eliminates double vision. Educated on wearing schedule, time with it off to assess improvement, returning to work, safety with navigating daily tasks at home. Pt verbalized understanding. No follow-up Occupational Therapy or equipment needs. OT is signing off. Thank you for this referral.        If plan is discharge home, recommend the following: Assist for transportation PRN    Functional Status Assessment  Patient has had a recent decline in their functional status and demonstrates the ability to make significant improvements in function in a reasonable and predictable amount of time.  Equipment Recommendations  None recommended by OT       Precautions / Restrictions Precautions Precautions: Other (comment) Precaution Comments: diplopia when left eye is not occluded. Restrictions Weight Bearing Restrictions: No      Mobility Bed Mobility Overal bed mobility: Independent         Transfers Overall transfer level: Modified independent Equipment used: None      General transfer comment: With eye patch on over left eye pt is Mod I. Completed all functional transfers and mobility in room, hallway, and bathroom  at a slower pace and safely. No device used.      Balance Overall balance assessment: Modified Independent        ADL either performed or assessed with clinical judgement   ADL Overall ADL's : Modified independent;At baseline;Independent       General ADL Comments: With eye patch on over Left eye, pt is able to complete all ADL tasks and functional mobility at Independent/Mod I level.     Vision Baseline Vision/History: 0 No visual deficits Ability to See in Adequate Light: 0 Adequate Patient Visual Report: Diplopia Vision Assessment?: Yes Eye Alignment: Impaired (comment) (Left eye is slightly upper left quadrant) Ocular Range of Motion: Restricted on the right (Left eye restricted) Alignment/Gaze Preference: Head tilt Tracking/Visual Pursuits: Left eye does not track medially Saccades: Additional eye shifts occurred during testing;Additional head turns occurred during testing;Decreased speed of saccadic movement Convergence: Within functional limits Visual Fields: No apparent deficits Diplopia Assessment: Disappears with one eye closed;Objects split on top of one another;Other (comment) (Pt reports presence both near and far gaze although far gaze is more severe.) Depth Perception: Overshoots (initially overshoots until able to adjust to double vision. more difficult when going fast) Additional Comments: diplopia started Saturday morning after waking up.Left eye is dominant.     Perception Perception: Within Functional Limits       Praxis Praxis: WFL       Pertinent Vitals/Pain Pain Assessment Pain Assessment: No/denies pain     Extremity/Trunk Assessment Upper Extremity Assessment Upper Extremity Assessment: Left hand dominant;Overall [facility] for tasks assessed   Lower Extremity Assessment Lower Extremity Assessment: Overall WFL for tasks assessed   Cervical /  Trunk Assessment Cervical / Trunk Assessment: Normal   Communication Communication Communication: No  apparent difficulties   Cognition Arousal: Alert Behavior During Therapy: WFL for tasks assessed/performed Overall Cognitive Status: Within Functional Limits for tasks assessed       General Comments  Pt educated on management strategy of using eye patch or occluded lens over left eye. Pt demonstrated good safety awareness and judgement during evaluation while navigating within room, bathroom, and hallway. Pt navigated full flight of stairs without difficulty. Eye patch preferred over glasses. Pt was educated to use eye patch when awake to decrease diplopia. Pt verbalized understanding.            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        OT Problem List: Impaired vision/perception         OT Goals(Current goals can be found in the care plan section) Acute Rehab OT Goals Patient Stated Goal: to use bathroom OT Goal Formulation: All assessment and education complete, DC therapy  OT Frequency:  1X visit       AM-PAC OT "6 Clicks" Daily Activity     Outcome Measure Help from another person eating meals?: None Help from another person taking care of personal grooming?: None Help from another person toileting, which includes using toliet, bedpan, or urinal?: None Help from another person  bathing (including washing, rinsing, drying)?: None Help from another person to put on and taking off regular upper body clothing?: None Help from another person to put on and taking off regular lower body clothing?: None 6 Click Score: 24   End of Session Equipment Utilized During Treatment: Gait belt Nurse Communication: Mobility status  Activity Tolerance: Patient tolerated treatment well Patient left: in chair;with call bell/phone within reach  OT Visit Diagnosis: Unsteadiness on feet (R26.81);Other (comment) (vision deficits)                Time: 0927-1021 OT Time Calculation (min): 54 min Charges:  OT General Charges $OT Visit: 1 Visit OT Evaluation $OT Eval High Complexity: 1 High OT Treatments $Self Care/Home Management : 8-22 mins $Therapeutic Activity: 23-37 mins  Limmie Patricia, OTR/L,CBIS  Supplemental OT - [facility] and [facility] Secure Chat Preferred    Adelyna Brockman, Charisse March 06/13/2023, 10:47 AM

## 2023-06-13 NOTE — Discharge Instructions (Addendum)
Patient instructions:  You were seen for double vision and a condition called left internuclear ophthalmoplegia, which is a neurological condition that affects your ability to move both eyes together when looking to one side. We think it may have been caused by your recent substance use. Neurology has recommended that you take the following medications as part of your treatment:  Aspirin 81 mg daily for 19 more doses  Plavix/Clopidogrel 75 mg daily (indefinitely) You will follow up with neurology in the outpatient setting. This appointment has been set up for you.  We STRONGLY recommend that you stop any substance use, as it can significantly increase your risk of heart or neurological events.  You were started on a new medication to help lower your cholesterol levels called Zetia/ezetimibe, which you will take daily. You will continue to take your atorvastatin 80 mg daily.  You were found to have an elevated creatinine, which indicates you may have an acute kidney injury. This can be common in the hospital setting. We encourage you to stay hydrated with water and to STOP taking your medication called Entresto (sacubitril-valsartan) until we can check your creatinine level again.  Please come into our clinic's lab ([facility] Medicine [facility] at [facility]) this Thursday, December 5th, so we can check your creatinine level. We will call you with the results and if your kidney function is better we will tell you to resume your Entresto at that time. If you notice any changes in urination, such as decreased frequency, darkening of your urine, or pain with urination, please call our office at [phone].  We may advise you to come in for your follow up appointment earlier.  We will set up a hospitalization follow up appointment at our Internal Medicine Clinic here at [facility] on December 19th at 1:15 PM with Dr. Merrilee Jansky. It is very important that you come to your appointment so you can  discuss how your vision is going and how your medications are going.  Please continue to wear your patch over your left eye to help with walking as well as the glasses that were provided to you. Your vision should improve over time. If you continue to have difficulties with your vision in a few months we can help refer you to ophthalmology, or the eye specialist, so they can provide more recommendations.  If you have any new vision changes, changes in speech, changes in your swallowing, a sharp headache, or chest pain please call 911 or go to your nearest emergency room for evaluation as these may be concerning for a stroke or heart attack.  We are glad you are feeling better. It was a pleasure serving you during your stay. - Dr. Justin Mend and the Internal Medicine Teaching Service.

## 2023-06-13 NOTE — Progress Notes (Signed)
PT Cancellation Note  Patient Details Name: DRAY LEREW MRN: [id number] DOB: [birth year]   Cancelled Treatment:    Reason Eval/Treat Not Completed: PT screened, no needs identified, will sign off (pt evaluated by OT and is independent).  Lillia Pauls, PT, DPT Acute Rehabilitation Services Office [phone]    Norval Morton 06/13/2023, 10:25 AM

## 2023-06-14 ENCOUNTER — Telehealth: Payer: Self-pay

## 2023-06-14 NOTE — Transitions of Care (Post Inpatient/ED Visit) (Signed)
   06/14/2023  Name: Sean Leblanc MRN: [id number] DOB: May 13, 1961  Today's TOC FU Call Status: Today's TOC FU Call Status:: Successful TOC FU Call Completed TOC FU Call Complete Date: 06/14/23 Patient's Name and Date of Birth confirmed.  Transition Care Management Follow-up Telephone Call Date of Discharge: 06/13/23 Discharge Facility: [facility] Type of Discharge: Emergency Department Reason for ED Visit: Other: (monocular diplopia) How have you been since you were released from the hospital?: Same Any questions or concerns?: No  Items Reviewed: Did you receive and understand the discharge instructions provided?: Yes Medications obtained,verified, and reconciled?: Yes (Medications Reviewed) Any new allergies since your discharge?: No Dietary orders reviewed?: NA Do you have support at home?: Yes People in Home: facility resident  Medications Reviewed Today: Medications Reviewed Today     Reviewed by Karena Addison, LPN (Licensed Practical Nurse) on 06/14/23 at 1059  Med List Status: <None>   Medication Order Taking? Sig Documenting Provider Last Dose Status Informant  aspirin 81 MG chewable tablet [id number]  Chew 1 tablet (81 mg total) by mouth daily. Rocky Morel, DO  Active   atorvastatin (LIPITOR) 80 MG tablet [id number] No Take 1 tablet (80 mg total) by mouth daily. Briscoe Burns, MD 06/12/2023 Active Self  carvedilol (COREG) 6.25 MG tablet [id number] No Take 1 tablet (6.25 mg total) by mouth 2 (two) times daily. Annett Fabian, MD 06/12/2023 Active Self  clopidogrel (PLAVIX) 75 MG tablet [id number]  Take 1 tablet (75 mg total) by mouth daily. Rocky Morel, DO  Active   dapagliflozin propanediol (FARXIGA) 10 MG TABS tablet [id number] No TAKE 1 TABLET(10 MG) BY MOUTH DAILY Briscoe Burns, MD 06/12/2023 Active Self  ezetimibe (ZETIA) 10 MG tablet [id number]  Take 1 tablet (10 mg total) by mouth daily. Rocky Morel, DO  Active   spironolactone (ALDACTONE) 25 MG  tablet [id number] No Take 12.5 mg by mouth daily. [provider] 06/12/2023 Active Self  varenicline (CHANTIX) 1 MG tablet [id number] No Take 1 tablet (1 mg total) by mouth 2 (two) times daily. Annett Fabian, MD 06/12/2023 Active Self  Med List Note Jola Schmidt, CPhT 11/18/22 1146):              Home Care and Equipment/Supplies: Were Home Health Services Ordered?: NA Any new equipment or medical supplies ordered?: NA  Functional Questionnaire: Do you need assistance with bathing/showering or dressing?: No Do you need assistance with meal preparation?: No Do you need assistance with eating?: No Do you have difficulty maintaining continence: No Do you need assistance with getting out of bed/getting out of a chair/moving?: No Do you have difficulty managing or taking your medications?: No  Follow up appointments reviewed: PCP Follow-up appointment confirmed?: Yes Date of PCP follow-up appointment?: 06/29/23 Follow-up Provider: [facility] Follow-up appointment confirmed?: NA Do you need transportation to your follow-up appointment?: No Do you understand care options if your condition(s) worsen?: Yes-patient verbalized understanding    SIGNATURE Karena Addison, LPN [facility] Nurse Health Advisor Direct Dial [phone]

## 2023-06-15 ENCOUNTER — Other Ambulatory Visit: Payer: Medicaid Other

## 2023-06-15 DIAGNOSIS — N179 Acute kidney failure, unspecified: Secondary | ICD-10-CM | POA: Diagnosis not present

## 2023-06-15 IMAGING — CT CT ANGIO CHEST
2 of 6 series · 19 of 46 positions shown · IV contrast (omnipaque)
Comparison: 06/30/2020

CLINICAL DATA: Cough, shortness of breath, elevated D-dimer,
question pulmonary embolism, history hypertension, cardiomyopathy,
TIA, smoker, coronary artery disease

EXAM:
CT ANGIOGRAPHY CHEST WITH CONTRAST
TECHNIQUE: Multidetector CT imaging of the chest was performed using the
standard protocol during bolus administration of intravenous
contrast. Multiplanar CT image reconstructions and MIPs were
obtained to evaluate the vascular anatomy.
CONTRAST:  50mL OMNIPAQUE IOHEXOL 350 MG/ML SOLN IV

[Series 6: thins · axial · 0.71mm/px · z∈[+1176,+1482]mm · 16 of 336 slices shown]
[im 15/336  lung]
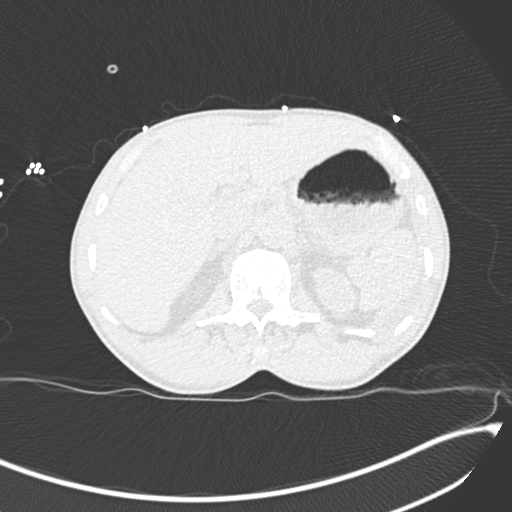
[im 44/336  soft-tissue]
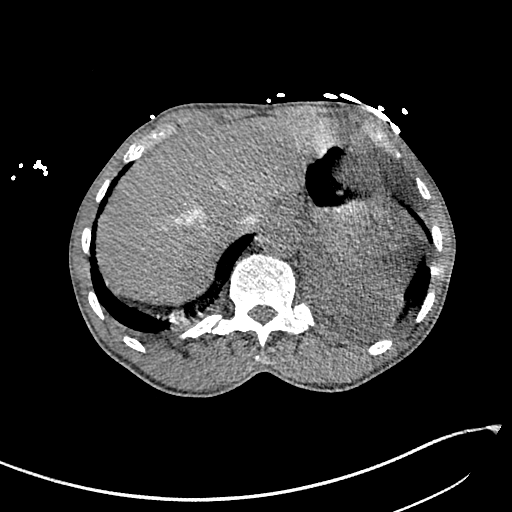
[im 59/336  lung]
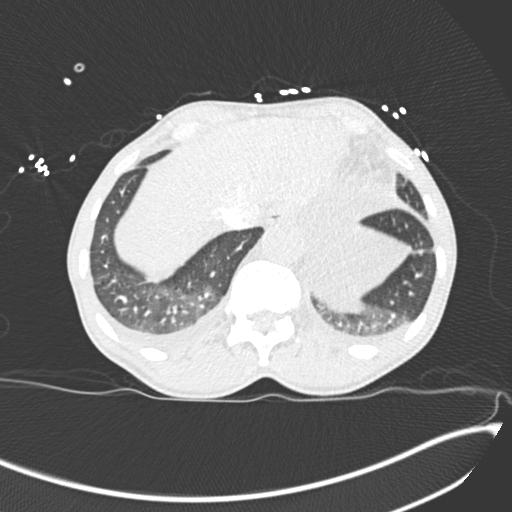
[im 73/336  soft-tissue]
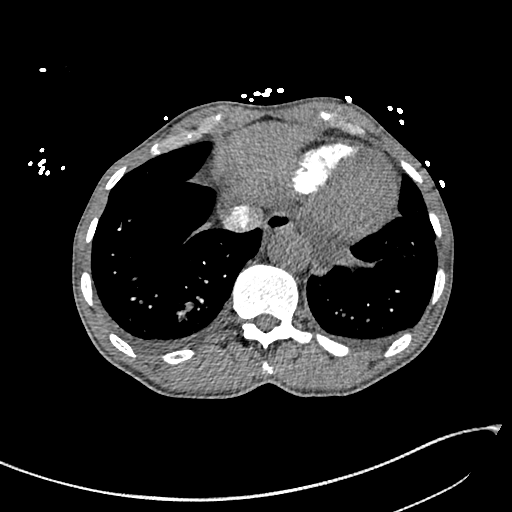
[im 102/336  lung]
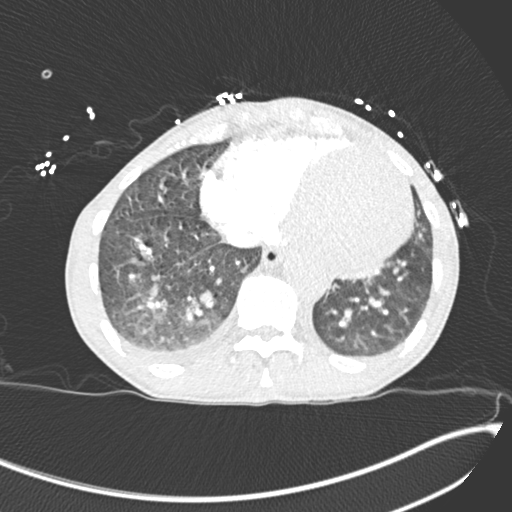
[im 117/336  soft-tissue]
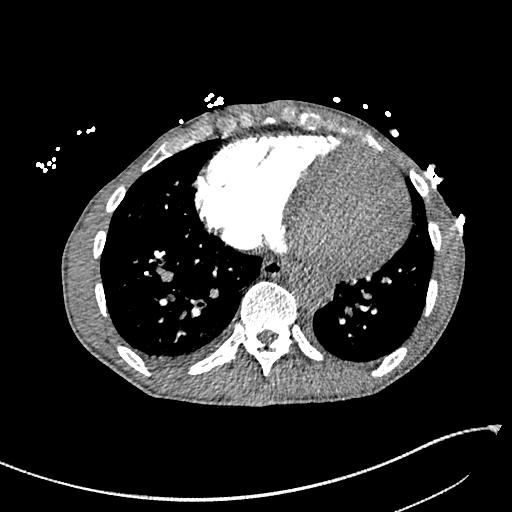
[im 132/336  lung]
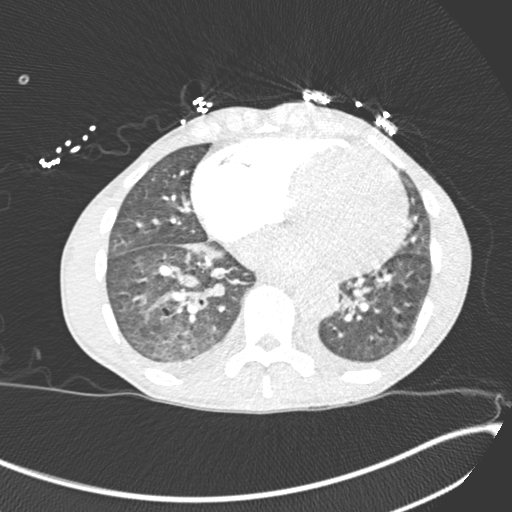
[im 161/336  soft-tissue]
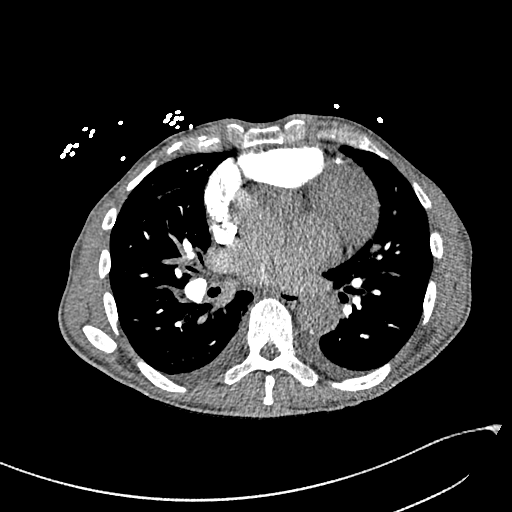
[im 175/336  lung]
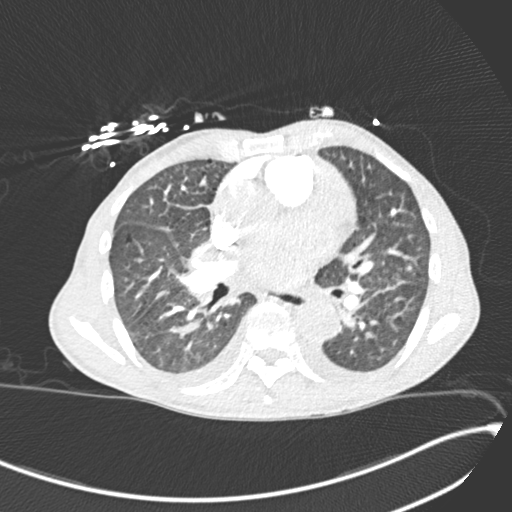
[im 204/336  soft-tissue]
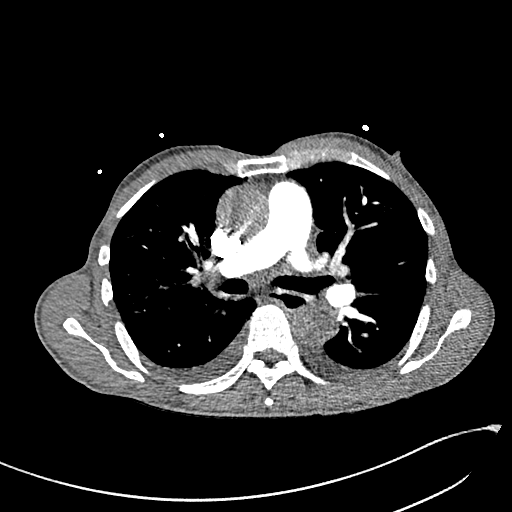
[im 219/336  lung]
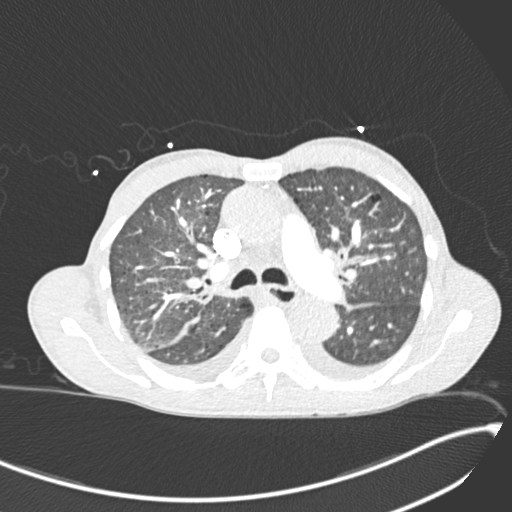
[im 234/336  soft-tissue]
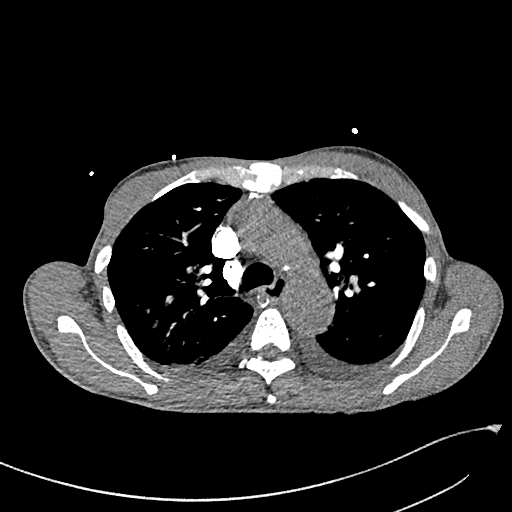
[im 263/336  lung]
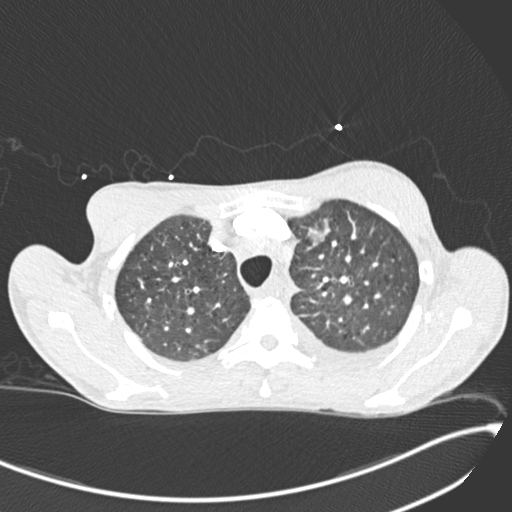
[im 277/336  soft-tissue]
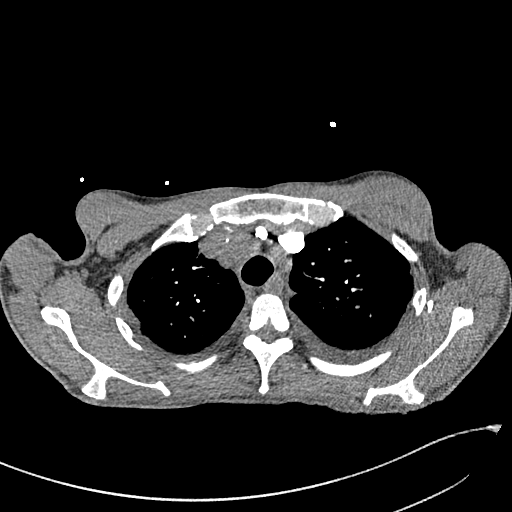
[im 292/336  lung]
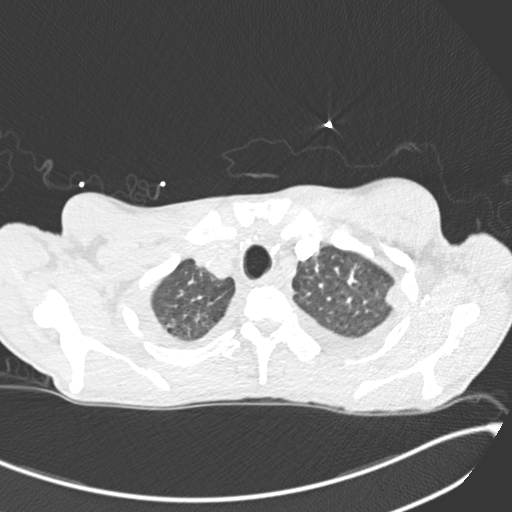
[im 321/336  soft-tissue]
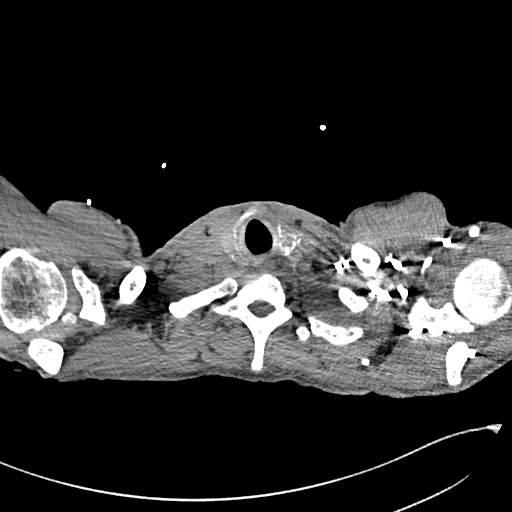

[Series 8: coronal mpr · coronal · 0.71mm/px · 3 of 149 slices shown]
[im 38/149  soft-tissue]
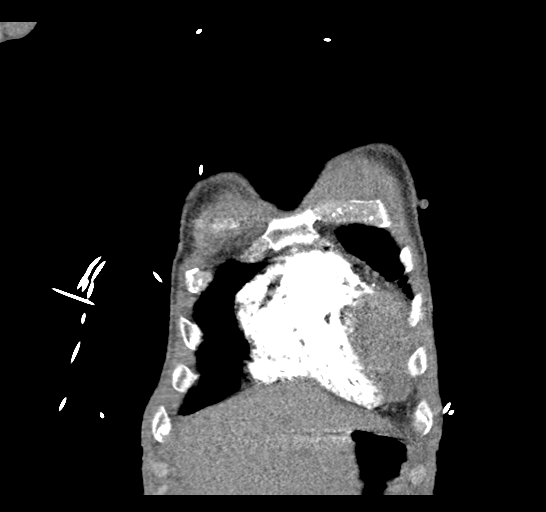
[im 75/149  soft-tissue]
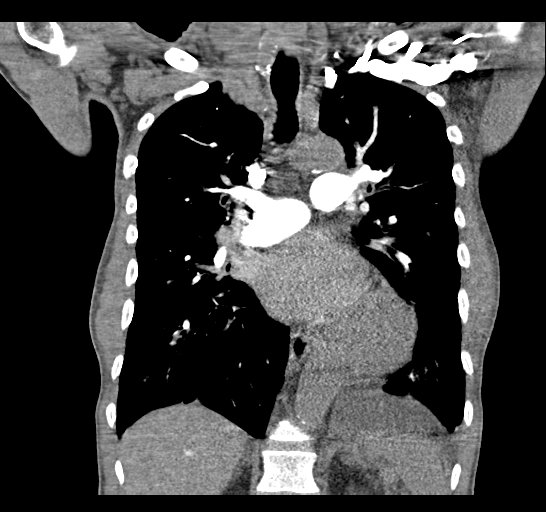
[im 112/149  soft-tissue]
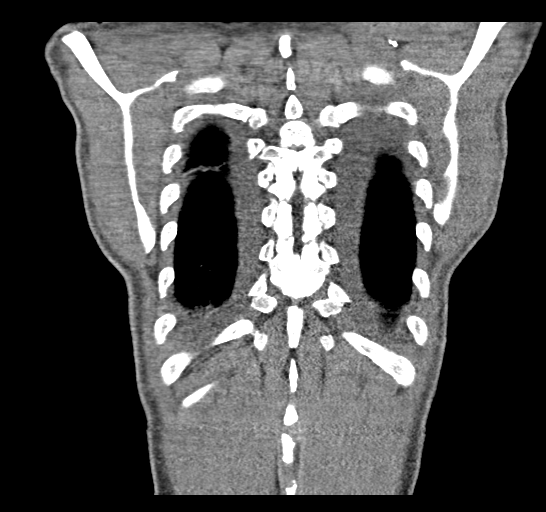

[19 of 46 positions shown; findings below may reference images not displayed]

FINDINGS: Cardiovascular: Atherosclerotic calcifications aorta, proximal great
vessels, and coronary arteries. Enlargement of cardiac chambers.
Ascending thoracic aorta upper normal caliber. Pulmonary arteries
well opacified and patent. No evidence of pulmonary embolism.

Mediastinum/Nodes: Esophagus unremarkable. No thoracic adenopathy.
Base of cervical region normal appearance.

Lungs/Pleura: Small BILATERAL pleural effusions. Calcified granuloma
RIGHT upper lobe. Emphysematous changes upper lobes. Minimal basilar
atelectasis and dependent density. No definite acute infiltrate or
pneumothorax. Central peribronchial thickening present.

Upper Abdomen: Unremarkable

Musculoskeletal: Unremarkable

Review of the MIP images confirms the above findings.
IMPRESSION: No evidence of pulmonary embolism.

Emphysematous changes and significant peribronchial thickening
consistent with bronchitis/COPD.

Small BILATERAL pleural effusions and bibasilar atelectasis.

Scattered atherosclerotic calcifications including coronary
arteries.

Aortic Atherosclerosis (I61HW-EES.S) and Emphysema (I61HW-B63.J).

## 2023-06-15 IMAGING — DX DG CHEST 1V PORT
1 series · 1 of 1 positions shown · non-contrast
Comparison: CT a chest 06/30/2020, chest radiographs 06/30/2020

CLINICAL DATA: Shortness of breath

EXAM:
PORTABLE CHEST 1 VIEW

[chest]
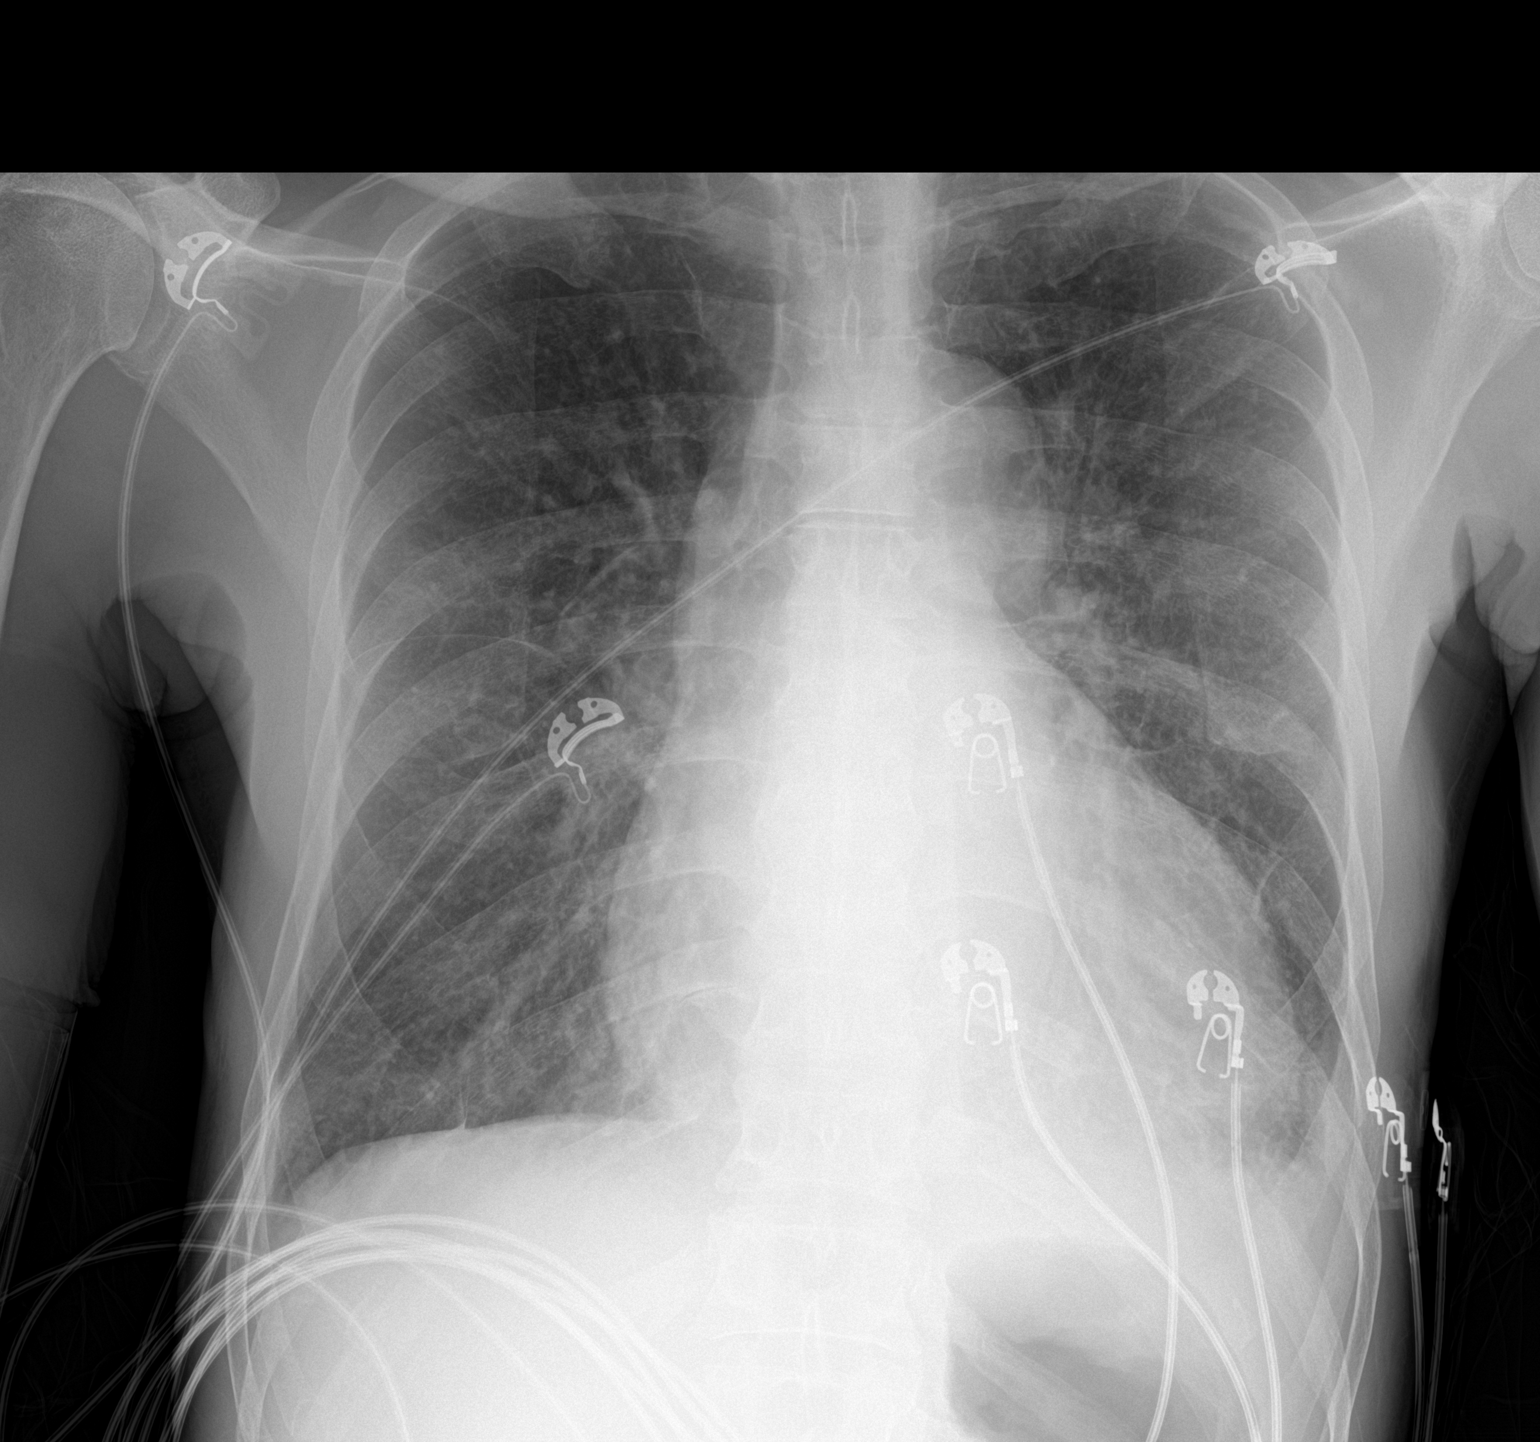

[1 of 1 positions shown; findings below may reference images not displayed]

FINDINGS: The heart is enlarged, significantly increased since the prior study
though likely partially exaggerated by AP technique. The mediastinal
contours are within normal limits.

There is a small left pleural effusion with adjacent patchy
opacities in the left base. There is no other focal opacity. There
is no right pleural effusion. There is no pneumothorax.

There is no acute osseous abnormality.
IMPRESSION: 1. Cardiomegaly, increased since the prior study.
2. Small left pleural effusion with adjacent airspace disease.

## 2023-06-16 LAB — BMP8+ANION GAP
Anion Gap: 17 mmol/L (ref 10.0–18.0)
BUN/Creatinine Ratio: 13 (ref 10–24)
BUN: 14 mg/dL (ref 8–27)
CO2: 19 mmol/L — ABNORMAL LOW (ref 20–29)
Calcium: 9.9 mg/dL (ref 8.6–10.2)
Chloride: 103 mmol/L (ref 96–106)
Creatinine, Ser: 1.06 mg/dL (ref 0.76–1.27)
Glucose: 140 mg/dL — ABNORMAL HIGH (ref 70–99)
Potassium: 4.2 mmol/L (ref 3.5–5.2)
Sodium: 139 mmol/L (ref 134–144)
eGFR: 79 mL/min/{1.73_m2} (ref >59)

## 2023-06-22 ENCOUNTER — Other Ambulatory Visit: Payer: Self-pay

## 2023-06-22 NOTE — Telephone Encounter (Signed)
I called the patient and advised him of the previous message. Patient understands.

## 2023-06-28 DIAGNOSIS — Z1211 Encounter for screening for malignant neoplasm of colon: Secondary | ICD-10-CM | POA: Diagnosis not present

## 2023-06-29 ENCOUNTER — Telehealth: Payer: Self-pay | Admitting: *Deleted

## 2023-06-29 ENCOUNTER — Ambulatory Visit: Payer: Medicaid Other | Admitting: Student

## 2023-06-29 VITALS — BP 122/74 | HR 68 | Temp 97.5°F | Ht 72.0 in | Wt 152.3 lb

## 2023-06-29 DIAGNOSIS — K219 Gastro-esophageal reflux disease without esophagitis: Secondary | ICD-10-CM | POA: Diagnosis not present

## 2023-06-29 DIAGNOSIS — F1090 Alcohol use, unspecified, uncomplicated: Secondary | ICD-10-CM

## 2023-06-29 DIAGNOSIS — Z1211 Encounter for screening for malignant neoplasm of colon: Secondary | ICD-10-CM

## 2023-06-29 DIAGNOSIS — Z720 Tobacco use: Secondary | ICD-10-CM

## 2023-06-29 DIAGNOSIS — F17211 Nicotine dependence, cigarettes, in remission: Secondary | ICD-10-CM

## 2023-06-29 DIAGNOSIS — I110 Hypertensive heart disease with heart failure: Secondary | ICD-10-CM

## 2023-06-29 DIAGNOSIS — I5022 Chronic systolic (congestive) heart failure: Secondary | ICD-10-CM | POA: Diagnosis not present

## 2023-06-29 DIAGNOSIS — H532 Diplopia: Secondary | ICD-10-CM

## 2023-06-29 DIAGNOSIS — F1410 Cocaine abuse, uncomplicated: Secondary | ICD-10-CM | POA: Diagnosis not present

## 2023-06-29 DIAGNOSIS — Z09 Encounter for follow-up examination after completed treatment for conditions other than malignant neoplasm: Secondary | ICD-10-CM

## 2023-06-29 DIAGNOSIS — E7849 Other hyperlipidemia: Secondary | ICD-10-CM

## 2023-06-29 DIAGNOSIS — I10 Essential (primary) hypertension: Secondary | ICD-10-CM

## 2023-06-29 MED ORDER — PANTOPRAZOLE SODIUM 20 MG PO TBEC: 20.0000 mg | DELAYED_RELEASE_TABLET | Freq: Every day | ORAL | 0 refills | Status: DC

## 2023-06-29 NOTE — Progress Notes (Signed)
Established Patient Office Visit  Subjective   Patient ID: Sean Leblanc, male    DOB: [birth year]  Age: 62 y.o. MRN: [id number]  Chief Complaint  Patient presents with   Follow-up    HPI  This is a 62 year old male living with a history stated below and presents today for Hospital follow up. Please see problem based assessment and plan for additional details.    Patient Active Problem List   Diagnosis Date Noted   Hospital discharge follow-up 06/30/2023   Acid reflux 06/30/2023   Diplopia 06/12/2023   Chronic HFrEF (heart failure with reduced ejection fraction) (HCC)    Atherosclerosis of aorta (HCC)    Heavy alcohol use    Cocaine abuse (HCC)    Thoracic radiculopathy 07/08/2020   Pulmonary nodule 07/08/2020   Pulsatile abdominal mass 02/27/2018   Colon cancer screening 04/25/2017   Healthcare maintenance 04/19/2016   History of transient ischemic attack (TIA) 03/10/2016   Hyperlipidemia 03/10/2016   Cardiomyopathy- suspect HTN CM- r/o ischemic 03/04/2016   Benign hypertension 03/03/2016   Tobacco abuse 03/03/2016   Past Medical History:  Diagnosis Date   Abnormal EKG    Anginal equivalent (HCC)    Atherosclerosis of aorta (HCC)    Cardiomyopathy (HCC)    CHF (congestive heart failure) (HCC)    Cocaine use 11/19/2022   Continuous dependence on cigarette smoking    Coronary artery spasm (HCC) 02/26/2021   Coronary atherosclerosis due to calcified coronary lesion    Dyspnea on exertion    ETOH abuse 11/19/2022   Hyperlipidemia    Hypertension    Hypertensive urgency 03/04/2016   Inclusion cyst 07/08/2020   Marijuana use 11/19/2022   Nonischemic cardiomyopathy (HCC) 11/19/2022   Nonobstructive atherosclerosis of coronary artery    NSTEMI (non-ST elevated myocardial infarction) (HCC)    Orthostatic hypotension 11/18/2022   TIA (transient ischemic attack)    Past Surgical History:  Procedure Laterality Date   LEFT HEART CATH AND CORONARY ANGIOGRAPHY N/A  02/26/2021   Procedure: LEFT HEART CATH AND CORONARY ANGIOGRAPHY;  Surgeon: Yates Decamp, MD;  Location: [facility];  Service: Cardiovascular;  Laterality: N/A;   Social History   Tobacco Use   Smoking status: Former    Current packs/day: 0.40    Average packs/day: 0.4 packs/day for 10.0 years (4.0 ttl pk-yrs)    Types: Cigarettes   Smokeless tobacco: Never   Tobacco comments:    1-2 per week   Vaping Use   Vaping status: Never Used  Substance Use Topics   Alcohol use: Yes    Comment: 2 X weekly - 6pack - 12 pack   Drug use: Not Currently    Types: Marijuana, Codeine    Comment: Rarely.   Social History   Socioeconomic History   Marital status: Single    Spouse name: Not on file   Number of children: Not on file   Years of education: Not on file   Highest education level: Not on file  Occupational History   Not on file  Tobacco Use   Smoking status: Former    Current packs/day: 0.40    Average packs/day: 0.4 packs/day for 10.0 years (4.0 ttl pk-yrs)    Types: Cigarettes   Smokeless tobacco: Never   Tobacco comments:    1-2 per week   Vaping Use   Vaping status: Never Used  Substance and Sexual Activity   Alcohol use: Yes    Comment: 2 X weekly -  6pack - 12 pack   Drug use: Not Currently    Types: Marijuana, Codeine    Comment: Rarely.   Sexual activity: Not on file  Other Topics Concern   Not on file  Social History Narrative   Not on file   Social Drivers of Health   Financial Resource Strain: Not on file  Food Insecurity: No Food Insecurity (06/12/2023)   Hunger Vital Sign    Worried About Running Out of Food in the Last Year: Never true    Ran Out of Food in the Last Year: Never true  Transportation Needs: No Transportation Needs (06/12/2023)   PRAPARE - Administrator, Civil Service (Medical): No    Lack of Transportation (Non-Medical): No  Physical Activity: Not on file  Stress: Not on file  Social Connections: Not on file   Intimate Partner Violence: Not At Risk (06/12/2023)   Humiliation, Afraid, Rape, and Kick questionnaire    Fear of Current or Ex-Partner: No    Emotionally Abused: No    Physically Abused: No    Sexually Abused: No   Family History  Problem Relation Age of Onset   Heart attack Mother    Cancer Father    No Known Allergies  ROS   ROS negative except for what is noted on the assessment and plan.  Objective:     BP 122/74 (BP Location: Left Arm, Patient Position: Sitting, Cuff Size: Normal)   Pulse 68   Temp (!) 97.5 F (36.4 C) (Oral)   Ht 6' (1.829 m)   Wt 152 lb 4.8 oz (69.1 kg)   SpO2 98%   BMI 20.66 kg/m  BP Readings from Last 3 Encounters:  06/29/23 122/74  06/13/23 (!) 141/85  05/02/23 112/78   Wt Readings from Last 3 Encounters:  06/29/23 152 lb 4.8 oz (69.1 kg)  06/12/23 147 lb 0.8 oz (66.7 kg)  05/02/23 147 lb (66.7 kg)   SpO2 Readings from Last 3 Encounters:  06/29/23 98%  06/13/23 100%  05/02/23 100%      Physical Exam  General: Sitting in chair, in no acute distress HEENT: Atraumatic, normocephalic, PERRLA, EOMI. Cardiovascular: Regular rate, regular rhythm, no m/r/g.  No lower extremity edema bilaterally Cardiovascular: CTAB, no wheezing or crackles MSK: Range of motion intact  No results found for any visits on 06/29/23.  Last CBC Lab Results  Component Value Date   WBC 5.5 06/13/2023   HGB 14.2 06/13/2023   HCT 43.6 06/13/2023   MCV 86.9 06/13/2023   MCH 28.3 06/13/2023   RDW 12.7 06/13/2023   PLT 283 06/13/2023   Last metabolic panel Lab Results  Component Value Date   GLUCOSE 140 (H) 06/15/2023   NA 139 06/15/2023   K 4.2 06/15/2023   CL 103 06/15/2023   CO2 19 (L) 06/15/2023   BUN 14 06/15/2023   CREATININE 1.06 06/15/2023   EGFR 79 06/15/2023   CALCIUM 9.9 06/15/2023   PROT 8.2 (H) 11/18/2022   ALBUMIN 4.4 11/18/2022   BILITOT 1.7 (H) 11/18/2022   ALKPHOS 55 11/18/2022   AST 27 11/18/2022   ALT 24 11/18/2022    ANIONGAP 10 06/13/2023   Last lipids Lab Results  Component Value Date   CHOL 120 06/13/2023   HDL 27 (L) 06/13/2023   LDLCALC 76 06/13/2023   TRIG 85 06/13/2023   CHOLHDL 4.4 06/13/2023   Last hemoglobin A1c Lab Results  Component Value Date   HGBA1C 5.2 06/13/2023   Last thyroid  functions Lab Results  Component Value Date   TSH 2.198 06/12/2023      The ASCVD Risk score (Arnett DK, et al., 2019) failed to calculate for the following reasons:   Risk score cannot be calculated because patient has a medical history suggesting prior/existing ASCVD    Assessment & Plan:   Patient was discussed with: [place]  Problem List Items Addressed This Visit       Cardiovascular and Mediastinum   Benign hypertension   BP Readings from Last 3 Encounters:  06/29/23 122/74  06/13/23 (!) 141/85  05/02/23 112/78   Patient is currently taking Coreg 6.25 mg twice daily and Farxiga 10 mg daily.  He was currently unsure whether he was taking the spironolactone half a tablet at home after the discharge.  Therefore when the patient went home, he called back the clinic and told us that he was not taking spironolactone or Entresto.  Patient's blood pressure is at goal, it is 122/74.  He denies any headaches, chest pain, shortness of breath, blurring of his vision, lower extremity edema.  Plan: -Continue taking Coreg 6.25 mg twice daily and Farxiga 10 mg daily -Will start Entresto 24-26 twice daily and slowly titrated as your blood pressure tolerates -Consider starting back patient's spironolactone 12.5 mg at the next office visit or as patient's blood pressure tolerates      Relevant Medications   sacubitril-valsartan (ENTRESTO) 24-26 MG   Chronic HFrEF (heart failure with reduced ejection fraction) (HCC)   Patient is current medication regimen includes carvedilol 6.25 mg twice daily and Farxiga 10 mg daily.  He is currently not taking spironolactone.  After the hospital discharge he was told  to hold Entresto due to AKI.  There was some confusion about whether he was taking the spironolactone medication or not.  Patient called after he went home, reported that he is not taking the spironolactone.  Blood pressure here is at goal, 122/74.  Therefore plan is to start patient's Entresto at a lower dose and titrated as patient's blood pressure tolerates.  His AKI has resolved.  Per the physical exam, patient appears euvolemic, no crackles during lung auscultation and no lower extremity edema bilaterally.  Plan: -Continue Coreg 6.25 mg twice daily and Farxiga 10 mg daily -Start Entresto 24-26 mg twice daily -Plan on starting back his Entresto 12.5 mg as his blood pressure tolerates during the next visit.  Continue titrating his GDMT to achieve optimal goal.      Relevant Medications   sacubitril-valsartan (ENTRESTO) 24-26 MG     Digestive   Acid reflux   Patient reports burning sensation in his chest, associates it with food.  Patient reports it is worse when he lays flat.  Patient reports a previous history of acid reflux, states that he was placed on a medication for it.    Plan: -Protonix 20 mg daily for 2 weeks -Will reevaluate your symptoms at the next office visit      Relevant Medications   pantoprazole (PROTONIX) 20 MG tablet     Other   Tobacco abuse (Chronic)   Patient reports that since his hospital discharge he has not been smoking.  He is currently taking Chantix 1 mg twice daily.  He reports it has been helping with his cravings.  Plan: -Continue Chantix 1 mg twice daily -Tobacco counseling provided      Hyperlipidemia   Lab Results  Component Value Date   LDLCALC 76 06/13/2023   Patient is currently taking atorvastatin  80 mg and Zetia 10 mg daily.  No concerns at this time.  His last LDL was 76 checked on 12/3.  No other concerns at this time.  Plan: -Continue atorvastatin 80 mg daily and Zetia 10 mg daily. -Will check a lipid panel at your next office  visit.      Relevant Medications   sacubitril-valsartan (ENTRESTO) 24-26 MG   Heavy alcohol use   Patient reports since his hospital discharge, he has not had any drinks.  Denies any withdrawal symptoms.      Cocaine abuse [facility])   Patient reports that he has quit smoking cocaine since the discharge.      Diplopia   Patient was recently admitted on June 12, 2023 for diplopia/INO secondary to cocaine abuse.  He was discharged on 06/13/2023 with DAPT x 21 days followed by Plavix monotherapy.  During this hospitalization, Zetia was also added to atorvastatin 80 mg daily for better LDL goal.  Patient was heavily counseled on cocaine and tobacco cessation.  Patient presents today reporting that after the hospitalization, his symptoms resolved within 3 days.  He is currently taking aspirin 81 mg and Plavix 75 mg.  He denies any concerns at this time.  He denies any blurring of his vision or issues with his vision.  Denies any numbness or tingling anywhere.  Per the physical exam, he has PERRLA, EOMI. no strabismus or nystagmus.  Plan: -Continue aspirin 81 mg until December 23, then stop -Continue Plavix 75 mg indefinitely -If patient starts having any ocular symptoms please refer to ophthalmology      Hospital discharge follow-up - Primary   Patient presents today for follow-up after his hospital discharge on 06/13/2023, he was initially admitted for diplopia secondary to cocaine abuse.  He was discharged on DAPT x 21 days followed by Plavix monotherapy.  He was also supposed to start Zetia with addition to atorvastatin 80 mg daily.  Please look at the diplopia section for further plan and assessment.      Other Visit Diagnoses       Screening for colon cancer           Return in about 1 month (around 07/30/2023) for HTN.    Jeral Pinch, DO

## 2023-06-29 NOTE — Telephone Encounter (Signed)
Pt's calling back per Dr Merrilee Jansky his list of medications: ASA 81mg  daily, Atorvastatin 80mg  daily,  Carvedilol 6.25mg  BID,  Plavix 75mg  daily,  Farxiga 10mg  daily,  Zetia 10mg  daily,  and Chantix 1mg  BID.  Stated he was told to stop Spironolactone 25mg  (1/2 tab) by the last doctor until his next appt which was today.

## 2023-06-29 NOTE — Patient Instructions (Addendum)
Thank you, [name] for allowing [place] to provide your care today. Today we discussed hospital follow-up  For your eyes -Aspirin 81 mg, take 1 tablet by mouth until December 23 and then stop taking this medicine -Continue taking Plavix 75 mg, take 1 tablet by mouth daily  2.  For your blood pressure -Continue taking Coreg 6.25 mg 2 times daily -Continue taking Farxiga 10 mg 1 tablet daily  Hold Entresto When you go home call us and let me know what medicine you are taking to confirm whether you are taking the spironolactone 12.5 mg or not.  3.  For your acid reflux -Take Protonix 20 mg once daily an hour before the meal  4.  For your cholesterol -Continue taking atorvastatin 80 mg daily and Zetia 10 mg daily  5.  For your smoking -continue taking Chantix 1 mg 2 times daily  I am happy to hear that you are not smoking cocaine anymore and do not drink any more alcohol.  Please continue with that.  I have ordered the following labs for you:  Lab Orders  No laboratory test(s) ordered today     Tests ordered today:  none  Referrals ordered today:   Referral Orders  No referral(s) requested today     I have ordered the following medication/changed the following medications:   Stop the following medications: There are no discontinued medications.   Start the following medications: Meds ordered this encounter  Medications   pantoprazole (PROTONIX) 20 MG tablet    Sig: Take 1 tablet (20 mg total) by mouth daily for 14 days.    Dispense:  14 tablet    Refill:  0     Follow up:  1 Month HTN     Remember:   Should you have any questions or concerns please call the internal medicine clinic at [phone].     Jeral Pinch, DO [facility]

## 2023-06-30 DIAGNOSIS — Z09 Encounter for follow-up examination after completed treatment for conditions other than malignant neoplasm: Secondary | ICD-10-CM | POA: Insufficient documentation

## 2023-06-30 DIAGNOSIS — K219 Gastro-esophageal reflux disease without esophagitis: Secondary | ICD-10-CM

## 2023-06-30 HISTORY — DX: Gastro-esophageal reflux disease without esophagitis: K21.9

## 2023-06-30 LAB — FECAL OCCULT BLOOD, IMMUNOCHEMICAL: Fecal Occult Bld: NEGATIVE

## 2023-06-30 MED ORDER — ENTRESTO 24-26 MG PO TABS: 1.0000 | ORAL_TABLET | Freq: Two times a day (BID) | ORAL | 0 refills | Status: AC

## 2023-06-30 NOTE — Assessment & Plan Note (Signed)
BP Readings from Last 3 Encounters:  06/29/23 122/74  06/13/23 (!) 141/85  05/02/23 112/78   Patient is currently taking Coreg 6.25 mg twice daily and Farxiga 10 mg daily.  He was currently unsure whether he was taking the spironolactone half a tablet at home after the discharge.  Therefore when the patient went home, he called back the clinic and told us that he was not taking spironolactone or Entresto.  Patient's blood pressure is at goal, it is 122/74.  He denies any headaches, chest pain, shortness of breath, blurring of his vision, lower extremity edema.  Plan: -Continue taking Coreg 6.25 mg twice daily and Farxiga 10 mg daily -Will start Entresto 24-26 twice daily and slowly titrated as your blood pressure tolerates -Consider starting back patient's spironolactone 12.5 mg at the next office visit or as patient's blood pressure tolerates

## 2023-06-30 NOTE — Assessment & Plan Note (Signed)
Patient reports since his hospital discharge, he has not had any drinks.  Denies any withdrawal symptoms.

## 2023-06-30 NOTE — Assessment & Plan Note (Signed)
Patient was recently admitted on June 12, 2023 for diplopia/INO secondary to cocaine abuse.  He was discharged on 06/13/2023 with DAPT x 21 days followed by Plavix monotherapy.  During this hospitalization, Zetia was also added to atorvastatin 80 mg daily for better LDL goal.  Patient was heavily counseled on cocaine and tobacco cessation.  Patient presents today reporting that after the hospitalization, his symptoms resolved within 3 days.  He is currently taking aspirin 81 mg and Plavix 75 mg.  He denies any concerns at this time.  He denies any blurring of his vision or issues with his vision.  Denies any numbness or tingling anywhere.  Per the physical exam, he has PERRLA, EOMI. no strabismus or nystagmus.  Plan: -Continue aspirin 81 mg until December 23, then stop -Continue Plavix 75 mg indefinitely -If patient starts having any ocular symptoms please refer to ophthalmology

## 2023-06-30 NOTE — Assessment & Plan Note (Signed)
Patient reports burning sensation in his chest, associates it with food.  Patient reports it is worse when he lays flat.  Patient reports a previous history of acid reflux, states that he was placed on a medication for it.    Plan: -Protonix 20 mg daily for 2 weeks -Will reevaluate your symptoms at the next office visit

## 2023-06-30 NOTE — Assessment & Plan Note (Signed)
Lab Results  Component Value Date   LDLCALC 76 06/13/2023   Patient is currently taking atorvastatin 80 mg and Zetia 10 mg daily.  No concerns at this time.  His last LDL was 76 checked on 12/3.  No other concerns at this time.  Plan: -Continue atorvastatin 80 mg daily and Zetia 10 mg daily. -Will check a lipid panel at your next office visit.

## 2023-06-30 NOTE — Assessment & Plan Note (Signed)
Patient reports that since his hospital discharge he has not been smoking.  He is currently taking Chantix 1 mg twice daily.  He reports it has been helping with his cravings.  Plan: -Continue Chantix 1 mg twice daily -Tobacco counseling provided

## 2023-06-30 NOTE — Assessment & Plan Note (Signed)
Patient reports that he has quit smoking cocaine since the discharge.

## 2023-06-30 NOTE — Assessment & Plan Note (Signed)
Patient is current medication regimen includes carvedilol 6.25 mg twice daily and Farxiga 10 mg daily.  He is currently not taking spironolactone.  After the hospital discharge he was told to hold Entresto due to AKI.  There was some confusion about whether he was taking the spironolactone medication or not.  Patient called after he went home, reported that he is not taking the spironolactone.  Blood pressure here is at goal, 122/74.  Therefore plan is to start patient's Entresto at a lower dose and titrated as patient's blood pressure tolerates.  His AKI has resolved.  Per the physical exam, patient appears euvolemic, no crackles during lung auscultation and no lower extremity edema bilaterally.  Plan: -Continue Coreg 6.25 mg twice daily and Farxiga 10 mg daily -Start Entresto 24-26 mg twice daily -Plan on starting back his Entresto 12.5 mg as his blood pressure tolerates during the next visit.  Continue titrating his GDMT to achieve optimal goal.

## 2023-06-30 NOTE — Assessment & Plan Note (Addendum)
Patient presents today for follow-up after his hospital discharge on 06/13/2023, he was initially admitted for diplopia secondary to cocaine abuse.  He was discharged on DAPT x 21 days followed by Plavix monotherapy.  He was also supposed to start Zetia with addition to atorvastatin 80 mg daily.  Please look at the diplopia section for further plan and assessment.

## 2023-07-10 NOTE — Progress Notes (Signed)
Internal Medicine Clinic Attending  I was physically present during the key portions of the resident provided service and participated in the medical decision making of patient's management care. I reviewed pertinent patient test results.  The assessment, diagnosis, and plan were formulated together and I agree with the documentation in the resident's note.  Mercie Eon, MD    I agree with plan to continue Coreg 6.25mg  BID, Farxiga 10mg  daily, and start back Entresto 24-26 BID. At next visit, check BMP and consider adding back Spironolactone 12.5mg  daily.  Vision changes have fortunately resolved, so no need for Ophtho follow up

## 2023-07-12 DIAGNOSIS — Z419 Encounter for procedure for purposes other than remedying health state, unspecified: Secondary | ICD-10-CM | POA: Diagnosis not present

## 2023-07-24 ENCOUNTER — Other Ambulatory Visit: Payer: Self-pay | Admitting: Student

## 2023-07-24 DIAGNOSIS — I5021 Acute systolic (congestive) heart failure: Secondary | ICD-10-CM

## 2023-07-24 DIAGNOSIS — I10 Essential (primary) hypertension: Secondary | ICD-10-CM

## 2023-07-24 DIAGNOSIS — I429 Cardiomyopathy, unspecified: Secondary | ICD-10-CM

## 2023-07-24 DIAGNOSIS — Z720 Tobacco use: Secondary | ICD-10-CM

## 2023-07-24 MED ORDER — VARENICLINE TARTRATE 1 MG PO TABS: 1.0000 mg | ORAL_TABLET | Freq: Two times a day (BID) | ORAL | 5 refills | Status: DC

## 2023-07-24 MED ORDER — CARVEDILOL 6.25 MG PO TABS: 6.2500 mg | ORAL_TABLET | Freq: Two times a day (BID) | ORAL | 3 refills | Status: DC

## 2023-08-01 ENCOUNTER — Encounter: Payer: Self-pay | Admitting: Student

## 2023-08-01 ENCOUNTER — Ambulatory Visit: Payer: Medicaid Other | Admitting: Student

## 2023-08-01 VITALS — BP 135/89 | HR 91 | Temp 98.4°F | Ht 72.0 in | Wt 151.3 lb

## 2023-08-01 DIAGNOSIS — F1491 Cocaine use, unspecified, in remission: Secondary | ICD-10-CM

## 2023-08-01 DIAGNOSIS — Z87898 Personal history of other specified conditions: Secondary | ICD-10-CM | POA: Diagnosis not present

## 2023-08-01 DIAGNOSIS — Z87891 Personal history of nicotine dependence: Secondary | ICD-10-CM

## 2023-08-01 DIAGNOSIS — E7849 Other hyperlipidemia: Secondary | ICD-10-CM | POA: Diagnosis not present

## 2023-08-01 DIAGNOSIS — I10 Essential (primary) hypertension: Secondary | ICD-10-CM

## 2023-08-01 DIAGNOSIS — I5022 Chronic systolic (congestive) heart failure: Secondary | ICD-10-CM | POA: Diagnosis not present

## 2023-08-01 DIAGNOSIS — I110 Hypertensive heart disease with heart failure: Secondary | ICD-10-CM

## 2023-08-01 DIAGNOSIS — Z0000 Encounter for general adult medical examination without abnormal findings: Secondary | ICD-10-CM

## 2023-08-01 DIAGNOSIS — Z720 Tobacco use: Secondary | ICD-10-CM

## 2023-08-01 MED ORDER — EZETIMIBE 10 MG PO TABS: 10.0000 mg | ORAL_TABLET | Freq: Every day | ORAL | 3 refills | Status: DC

## 2023-08-01 MED ORDER — SACUBITRIL-VALSARTAN 24-26 MG PO TABS: 1.0000 | ORAL_TABLET | Freq: Two times a day (BID) | ORAL | 3 refills | Status: DC

## 2023-08-01 MED ORDER — CLOPIDOGREL BISULFATE 75 MG PO TABS: 75.0000 mg | ORAL_TABLET | Freq: Every day | ORAL | 3 refills | Status: DC

## 2023-08-01 NOTE — Patient Instructions (Addendum)
Thank you, [name] for allowing [place] to provide your care today. Today we discussed your blood pressure, medications, and healthcare maintenance.  I have ordered the following labs for you:   Lab Orders         Lipid panel      Tests ordered today:  None  Referrals ordered today:   Referral Orders  No referral(s) requested today     I have ordered the following medication/changed the following medications:   Stop the following medications: Medications Discontinued During This Encounter  Medication Reason   aspirin 81 MG chewable tablet No longer needed (for PRN medications)   ezetimibe (ZETIA) 10 MG tablet Reorder   clopidogrel (PLAVIX) 75 MG tablet Reorder   pantoprazole (PROTONIX) 20 MG tablet Patient has not taken in last 30 days     Start the following medications: Meds ordered this encounter  Medications   ezetimibe (ZETIA) 10 MG tablet    Sig: Take 1 tablet (10 mg total) by mouth daily.    Dispense:  30 tablet    Refill:  3   clopidogrel (PLAVIX) 75 MG tablet    Sig: Take 1 tablet (75 mg total) by mouth daily.    Dispense:  30 tablet    Refill:  3   sacubitril-valsartan (ENTRESTO) 24-26 MG    Sig: Take 1 tablet by mouth 2 (two) times daily.    Dispense:  60 tablet    Refill:  3     Follow up: 2 months    Remember:   - You received your flu shot today. Please bring information on your Shingles vaccine so we can mark that as completed.   - The following is a list of the medications you SHOULD be taking:   - Plavix 75 mg daily  - Coreg 6.25 mg twice daily  - Farxiga 10 mg daily  - Entresto (sacubitril-valsartan) 24-26 mg twice daily  - Zetia 10 mg daily  - Atorvastatin 80 mg daily - Chantix 1 mg twice daily   - We will see you in 2 months to check your blood pressure, check in on how you are doing with your tobacco/smoking cessation, and adjust medications as needed.   - I will call you with the results of your lab (lipid panel/cholesterol)  when that is available.   Should you have any questions or concerns please call the internal medicine clinic at [phone].     Sean Leblanc Colbert Coyer, MD PGY-1 Internal Medicine Teaching [facility]

## 2023-08-01 NOTE — Progress Notes (Signed)
Established Patient Office Visit  Subjective   Patient ID: Sean Leblanc, male    DOB: August 23, 1960  Age: 63 y.o. MRN: [id number]  No chief complaint on file.   Patient is a 63 y.o. with a past medical history stated below who presents today for follow-up for Chronic HFrEF, hypertension, and medication management. He was last seen on 06/29/23. At that time patient was restarted on Entresto 24-26 BID (which was previously held for about a month due to resolving AKI). Patient also started on a trial of Protonix 20 mg daily for 2 weeks. Please see problem based assessment and plan for additional details.      Past Medical History:  Diagnosis Date   Abnormal EKG    Acid reflux 06/30/2023   Anginal equivalent (HCC)    Atherosclerosis of aorta (HCC)    Cardiomyopathy (HCC)    CHF (congestive heart failure) (HCC)    Cocaine use 11/19/2022   Continuous dependence on cigarette smoking    Coronary artery spasm (HCC) 02/26/2021   Coronary atherosclerosis due to calcified coronary lesion    Diplopia 06/12/2023   Dyspnea on exertion    ETOH abuse 11/19/2022   Hyperlipidemia    Hypertension    Hypertensive urgency 03/04/2016   Inclusion cyst 07/08/2020   Marijuana use 11/19/2022   Nonischemic cardiomyopathy (HCC) 11/19/2022   Nonobstructive atherosclerosis of coronary artery    NSTEMI (non-ST elevated myocardial infarction) (HCC)    Orthostatic hypotension 11/18/2022   TIA (transient ischemic attack)       Review of Systems  Eyes:  Negative for blurred vision and double vision.  Respiratory:  Negative for cough and shortness of breath.   Cardiovascular:  Negative for chest pain, palpitations and leg swelling.  Genitourinary:  Negative for dysuria.  Neurological:  Negative for dizziness and headaches.     Objective:     BP 135/89 (BP Location: Left Arm, Patient Position: Sitting, Cuff Size: Normal)   Pulse 91   Temp 98.4 F (36.9 C) (Oral)   Ht 6' (1.829 m)   Wt 151 lb 4.8  oz (68.6 kg)   SpO2 99%   BMI 20.52 kg/m  BP Readings from Last 3 Encounters:  08/01/23 135/89  06/29/23 122/74  06/13/23 (!) 141/85   Wt Readings from Last 3 Encounters:  08/01/23 151 lb 4.8 oz (68.6 kg)  06/29/23 152 lb 4.8 oz (69.1 kg)  06/12/23 147 lb 0.8 oz (66.7 kg)   Physical Exam Constitutional:      Appearance: Normal appearance.  HENT:     Head: Normocephalic and atraumatic.  Eyes:     Extraocular Movements: Extraocular movements intact.     Pupils: Pupils are equal, round, and reactive to light.  Cardiovascular:     Rate and Rhythm: Normal rate and regular rhythm.     Heart sounds: Normal heart sounds.  Pulmonary:     Effort: Pulmonary effort is normal.     Breath sounds: Normal breath sounds.  Abdominal:     Palpations: Abdomen is soft.     Tenderness: There is no abdominal tenderness.  Musculoskeletal:        General: No swelling. Normal range of motion.     Cervical back: Normal range of motion.  Skin:    General: Skin is warm and dry.  Neurological:     General: No focal deficit present.     Mental Status: He is alert.  Psychiatric:        Mood and Affect:  Mood normal.        Behavior: Behavior normal.    No results found for any visits on 08/01/23.  Last metabolic panel Lab Results  Component Value Date   GLUCOSE 140 (H) 06/15/2023   NA 139 06/15/2023   K 4.2 06/15/2023   CL 103 06/15/2023   CO2 19 (L) 06/15/2023   BUN 14 06/15/2023   CREATININE 1.06 06/15/2023   EGFR 79 06/15/2023   CALCIUM 9.9 06/15/2023   PROT 8.2 (H) 11/18/2022   ALBUMIN 4.4 11/18/2022   BILITOT 1.7 (H) 11/18/2022   ALKPHOS 55 11/18/2022   AST 27 11/18/2022   ALT 24 11/18/2022   ANIONGAP 10 06/13/2023   The ASCVD Risk score (Arnett DK, et al., 2019) failed to calculate for the following reasons:   Risk score cannot be calculated because patient has a medical history suggesting prior/existing ASCVD    Assessment & Plan:   Problem List Items Addressed This  Visit     Benign hypertension   Relevant Medications   ezetimibe (ZETIA) 10 MG tablet   sacubitril-valsartan (ENTRESTO) 24-26 MG   Tobacco abuse (Chronic)   Hyperlipidemia - Primary   Relevant Medications   ezetimibe (ZETIA) 10 MG tablet   sacubitril-valsartan (ENTRESTO) 24-26 MG   Other Relevant Orders   Lipid panel   Healthcare maintenance   History of alcohol use   History of cocaine use   Chronic HFrEF (heart failure with reduced ejection fraction) (HCC)   Relevant Medications   ezetimibe (ZETIA) 10 MG tablet   sacubitril-valsartan (ENTRESTO) 24-26 MG   Return in about 2 months (around 09/29/2023) for HTN, CHF, medication management .   Joesiah Lonon Colbert Coyer, MD

## 2023-08-02 LAB — LIPID PANEL
Chol/HDL Ratio: 3.3 {ratio} (ref 0.0–5.0)
Cholesterol, Total: 123 mg/dL (ref 100–199)
HDL: 37 mg/dL — ABNORMAL LOW (ref >39)
LDL Chol Calc (NIH): 72 mg/dL (ref 0–99)
Triglycerides: 64 mg/dL (ref 0–149)
VLDL Cholesterol Cal: 14 mg/dL (ref 5–40)

## 2023-08-02 NOTE — Assessment & Plan Note (Signed)
Patient reports he has not had any alcoholic drinks since his hospital discharge in early December. Denies any withdrawal symptoms today.

## 2023-08-02 NOTE — Assessment & Plan Note (Signed)
Patient is doing well on Coreg 6.25 mg BID, Farxiga 10 mg daily, and Entresto 24-26 mg BID. Plan is to continue titrating GDMT as tolerated. For now patient feels well and is euvolemic on physical exam. Patient has experienced periods of dizziness and lightheadedness with therapy in the past, will hold off on increasing for now as patient remains asymptomatic.  Plan - Continue Coreg 6.25 mg BID, Farxiga 10 mg daily, and Entresto 24-26 mg BID

## 2023-08-02 NOTE — Assessment & Plan Note (Addendum)
Patient with history of tobacco use. Has been on Chantix since November 2024, takes Chantix 1 mg BID, continues to do well.  Plan - Continue Chantix 1 mg BID

## 2023-08-02 NOTE — Assessment & Plan Note (Signed)
Patient reports he has not used cocaine since December 1st.

## 2023-08-02 NOTE — Assessment & Plan Note (Signed)
BP today 135/89. Currently taking Coreg 6.25 mg BID, Farxiga 10 mg daily, and Entresto 24.26 BID. Denies any chest pain, headaches, shortness of breath, or swelling. No LE edema on exam. Patient states he feels well, has not had any episodes of dizziness or lightheadedness. Will remain on current antihypertensive regimen for now. Can consider going up on Entresto or adding additional agent like spironolactone at follow up visit if BP remains consistently elevated.  Plan  - Continue Coreg 6.25 mg BID, Farxiga 10 mg daily, and Entresto 24.26 BID

## 2023-08-02 NOTE — Assessment & Plan Note (Signed)
Patient received flu shot today. States he may have finished his shingles series at a pharmacy, will try to get printout so it can be uploaded to his records.

## 2023-08-02 NOTE — Assessment & Plan Note (Signed)
Patient is taking atorvastatin 80 mg daily and Zetia 10 mg daily. Lipid panel today with LDL 72.  Plan - Continue atorvastatin 80 mg daily and Zetia 10 mg daily

## 2023-08-10 NOTE — Progress Notes (Signed)
Internal Medicine Clinic Attending  Case discussed with the resident at the time of the visit.  We reviewed the resident's history and exam and pertinent patient test results.  I agree with the assessment, diagnosis, and plan of care documented in the resident's note.

## 2023-08-12 DIAGNOSIS — Z419 Encounter for procedure for purposes other than remedying health state, unspecified: Secondary | ICD-10-CM | POA: Diagnosis not present

## 2023-08-15 ENCOUNTER — Other Ambulatory Visit: Payer: Self-pay

## 2023-08-15 DIAGNOSIS — I429 Cardiomyopathy, unspecified: Secondary | ICD-10-CM

## 2023-08-15 DIAGNOSIS — I10 Essential (primary) hypertension: Secondary | ICD-10-CM

## 2023-08-15 DIAGNOSIS — I5021 Acute systolic (congestive) heart failure: Secondary | ICD-10-CM

## 2023-08-15 MED ORDER — DAPAGLIFLOZIN PROPANEDIOL 10 MG PO TABS: ORAL_TABLET | ORAL | 3 refills | Status: DC

## 2023-09-09 DIAGNOSIS — Z419 Encounter for procedure for purposes other than remedying health state, unspecified: Secondary | ICD-10-CM | POA: Diagnosis not present

## 2023-09-20 DIAGNOSIS — Z419 Encounter for procedure for purposes other than remedying health state, unspecified: Secondary | ICD-10-CM | POA: Diagnosis not present

## 2023-09-29 ENCOUNTER — Encounter: Payer: Self-pay | Admitting: Student

## 2023-09-29 ENCOUNTER — Ambulatory Visit: Payer: Medicaid Other | Admitting: Student

## 2023-09-29 VITALS — BP 132/87 | HR 56 | Temp 97.6°F | Wt 150.2 lb

## 2023-09-29 DIAGNOSIS — I110 Hypertensive heart disease with heart failure: Secondary | ICD-10-CM

## 2023-09-29 DIAGNOSIS — Z87891 Personal history of nicotine dependence: Secondary | ICD-10-CM

## 2023-09-29 DIAGNOSIS — I5022 Chronic systolic (congestive) heart failure: Secondary | ICD-10-CM

## 2023-09-29 DIAGNOSIS — I10 Essential (primary) hypertension: Secondary | ICD-10-CM

## 2023-09-29 MED ORDER — ENTRESTO 49-51 MG PO TABS: 1.0000 | ORAL_TABLET | Freq: Two times a day (BID) | ORAL | 3 refills | Status: DC

## 2023-09-29 NOTE — Assessment & Plan Note (Signed)
 EF of 35 to 45% per echo at the end of 2024.  Today he is euvolemic, lungs clear, no edema.  Weight today is 150 pounds.  He is adherent with his medicines.  Will increase GDMT per above.  In the future, can further increase these medicines as his blood pressure allows.

## 2023-09-29 NOTE — Assessment & Plan Note (Signed)
 He quit smoking and started Chantix roughly 6 months ago.  He continues to be abstinent.  The Chantix helped him.  At this point it is no longer needed. - Stop Chantix 1 BID

## 2023-09-29 NOTE — Progress Notes (Signed)
   CC: HTN and HFrEF checkup  HPI:  Sean Leblanc is a 63 y.o. male with a PMH stated below who presents today for follow up. He is without complaint. He is complaint with his medicines, he brought a list.  Please see problem based assessment and plan for additional details.  Past Medical History:  Diagnosis Date   Abnormal EKG    Acid reflux 06/30/2023   Anginal equivalent (HCC)    Atherosclerosis of aorta (HCC)    Cardiomyopathy (HCC)    CHF (congestive heart failure) (HCC)    Cocaine use 11/19/2022   Continuous dependence on cigarette smoking    Coronary artery spasm (HCC) 02/26/2021   Coronary atherosclerosis due to calcified coronary lesion    Diplopia 06/12/2023   Dyspnea on exertion    ETOH abuse 11/19/2022   Hyperlipidemia    Hypertension    Hypertensive urgency 03/04/2016   Inclusion cyst 07/08/2020   Marijuana use 11/19/2022   Nonischemic cardiomyopathy (HCC) 11/19/2022   Nonobstructive atherosclerosis of coronary artery    NSTEMI (non-ST elevated myocardial infarction) (HCC)    Orthostatic hypotension 11/18/2022   TIA (transient ischemic attack)     Review of Systems: ROS negative except for what is noted on the assessment and plan.  Vitals:   09/29/23 1036 09/29/23 1047  BP: (!) 150/90 132/87  Pulse: 62 (!) 56  Temp: 97.6 F (36.4 C)   TempSrc: Oral   SpO2: 100%   Weight: 150 lb 3.2 oz (68.1 kg)     Physical Exam: Constitutional: well-appearing man in no acute distress Cardiovascular: regular rate and rhythm, no m/r/g Pulmonary/Chest: normal work of breathing on room air, lungs clear to auscultation bilaterally Abdominal: soft, non-tender, non-distended MSK: normal bulk and tone. No edema whatsoever. Neurological: alert & oriented x 3, no focal deficit Skin: warm and dry Psych: normal mood and behavior  Assessment & Plan:   Patient discussed with Dr. Cleda Daub  Essential hypertension Presents today for 2 month follow-up.  On recheck,  blood pressure is 132/87.  He does not take his blood pressure at home.  He denies any symptoms.  He is adherent with all of his medicines.  Given his HFrEF I believe further titration of his medicines is necessary and will increase his Entresto. - Continue Coreg 6.25 twice daily - Continue Farxiga 10 daily - Increase Entresto to 49-51 twice daily  Chronic HFrEF (heart failure with reduced ejection fraction) (HCC) EF of 35 to 45% per echo at the end of 2024.  Today he is euvolemic, lungs clear, no edema.  Weight today is 150 pounds.  He is adherent with his medicines.  Will increase GDMT per above.  In the future, can further increase these medicines as his blood pressure allows.  History of tobacco abuse He quit smoking and started Chantix roughly 6 months ago.  He continues to be abstinent.  The Chantix helped him.  At this point it is no longer needed. - Stop Chantix 1 BID  RTC 3 months for checkup, BP, further GDMT increase as tolerated.  Katheran James, D.O. [facility] Internal Medicine, PGY-1 Phone: [phone] Date 09/29/2023 Time 11:19 AM

## 2023-09-29 NOTE — Assessment & Plan Note (Signed)
 Presents today for 2 month follow-up.  On recheck, blood pressure is 132/87.  He does not take his blood pressure at home.  He denies any symptoms.  He is adherent with all of his medicines.  Given his HFrEF I believe further titration of his medicines is necessary and will increase his Entresto. - Continue Coreg 6.25 twice daily - Continue Farxiga 10 daily - Increase Entresto to 49-51 twice daily

## 2023-09-29 NOTE — Patient Instructions (Signed)
 Stop chantix - it is no longer needed  Increase dose of entresto

## 2023-10-08 NOTE — Progress Notes (Signed)
 Internal Medicine Clinic Attending  Case discussed with the resident at the time of the visit.  We reviewed the resident's history and exam and pertinent patient test results.  I agree with the assessment, diagnosis, and plan of care documented in the resident's note.

## 2023-10-21 DIAGNOSIS — Z419 Encounter for procedure for purposes other than remedying health state, unspecified: Secondary | ICD-10-CM | POA: Diagnosis not present

## 2023-10-23 ENCOUNTER — Other Ambulatory Visit: Payer: Self-pay

## 2023-10-23 DIAGNOSIS — E7849 Other hyperlipidemia: Secondary | ICD-10-CM

## 2023-10-23 DIAGNOSIS — Z8673 Personal history of transient ischemic attack (TIA), and cerebral infarction without residual deficits: Secondary | ICD-10-CM

## 2023-10-23 MED ORDER — ATORVASTATIN CALCIUM 80 MG PO TABS: 80.0000 mg | ORAL_TABLET | Freq: Every day | ORAL | 3 refills | Status: AC

## 2023-10-23 NOTE — Telephone Encounter (Signed)
 Medication sent to pharmacy

## 2023-10-27 ENCOUNTER — Other Ambulatory Visit: Payer: Self-pay | Admitting: Student

## 2023-10-31 NOTE — Telephone Encounter (Signed)
 Medication sent to pharmacy

## 2023-10-31 NOTE — Telephone Encounter (Signed)
 Duplicate med refill request has already been addressed.

## 2023-11-20 DIAGNOSIS — Z419 Encounter for procedure for purposes other than remedying health state, unspecified: Secondary | ICD-10-CM | POA: Diagnosis not present

## 2023-11-21 ENCOUNTER — Other Ambulatory Visit: Payer: Self-pay | Admitting: Student

## 2023-11-21 DIAGNOSIS — I10 Essential (primary) hypertension: Secondary | ICD-10-CM

## 2023-11-21 DIAGNOSIS — I429 Cardiomyopathy, unspecified: Secondary | ICD-10-CM

## 2023-11-21 DIAGNOSIS — I5021 Acute systolic (congestive) heart failure: Secondary | ICD-10-CM

## 2023-11-21 NOTE — Telephone Encounter (Signed)
 Copied from CRM [id number]. Topic: Clinical - Medication Refill >> Nov 21, 2023  2:05 PM Carrielelia G wrote: Medication: dapagliflozin  propanediol (FARXIGA ) 10 MG TABS tablet  Has the patient contacted their pharmacy? No (Agent: If no, request that the patient contact the pharmacy for the refill. If patient does not wish to contact the pharmacy document the reason why and proceed with request.) (Agent: If yes, when and what did the pharmacy advise?)  This is the patient's preferred pharmacy:  [facility] [address], [place] - [address] AT [address] [place] [address] Phone: [phone] Fax: [phone]   Is this the correct pharmacy for this prescription? Yes If no, delete pharmacy and type the correct one.   Has the prescription been filled recently? Yes  Is the patient out of the medication? Yes  Has the patient been seen for an appointment in the last year OR does the patient have an upcoming appointment? Yes  Can we respond through MyChart? No  Agent: Please be advised that Rx refills may take up to 3 business days. We ask that you follow-up with your pharmacy.

## 2023-12-14 ENCOUNTER — Encounter: Payer: Self-pay | Admitting: *Deleted

## 2023-12-21 DIAGNOSIS — Z419 Encounter for procedure for purposes other than remedying health state, unspecified: Secondary | ICD-10-CM | POA: Diagnosis not present

## 2023-12-28 ENCOUNTER — Encounter: Payer: Self-pay | Admitting: Student

## 2023-12-28 ENCOUNTER — Ambulatory Visit: Admitting: Student

## 2023-12-28 VITALS — BP 109/79 | HR 60 | Temp 97.8°F | Ht 72.0 in | Wt 147.2 lb

## 2023-12-28 DIAGNOSIS — I110 Hypertensive heart disease with heart failure: Secondary | ICD-10-CM

## 2023-12-28 DIAGNOSIS — I5022 Chronic systolic (congestive) heart failure: Secondary | ICD-10-CM

## 2023-12-28 DIAGNOSIS — I429 Cardiomyopathy, unspecified: Secondary | ICD-10-CM

## 2023-12-28 DIAGNOSIS — I10 Essential (primary) hypertension: Secondary | ICD-10-CM | POA: Diagnosis not present

## 2023-12-28 DIAGNOSIS — I5021 Acute systolic (congestive) heart failure: Secondary | ICD-10-CM

## 2023-12-28 MED ORDER — DAPAGLIFLOZIN PROPANEDIOL 10 MG PO TABS: ORAL_TABLET | ORAL | 3 refills | Status: DC

## 2023-12-28 NOTE — Assessment & Plan Note (Signed)
 Patient presents for 70-month follow-up.  Blood pressure today is 109/79.  He reports adherence to his medications.  Plan: - Continue Entresto  49-51 mg twice daily - Continue Coreg  6.25 mg twice daily - Well-controlled - Follow-up BMP  - Follow-up in 3 months

## 2023-12-28 NOTE — Progress Notes (Signed)
 CC: HFrEF follow-up  HPI:  Sean Leblanc is a 63 y.o. male with a past medical history of HFrEF, hypertension, history of cocaine use who presents for follow-up appointment.  Please see assessment and plan for full HPI  Medications: HFrEF: Coreg  6.25 mg twice daily, Farxiga  10 mg daily, Entresto  49-51 mg twice daily Hyperlipidemia: Atorvastatin  80 mg daily, Zetia  10 mg daily TIA: Plavix  75 mg daily  Past Medical History:  Diagnosis Date   Abnormal EKG    Acid reflux 06/30/2023   Anginal equivalent (HCC)    Atherosclerosis of aorta (HCC)    Cardiomyopathy (HCC)    CHF (congestive heart failure) (HCC)    Cocaine use 11/19/2022   Continuous dependence on cigarette smoking    Coronary artery spasm (HCC) 02/26/2021   Coronary atherosclerosis due to calcified coronary lesion    Diplopia 06/12/2023   Dyspnea on exertion    ETOH abuse 11/19/2022   Hyperlipidemia    Hypertension    Hypertensive urgency 03/04/2016   Inclusion cyst 07/08/2020   Marijuana use 11/19/2022   Nonischemic cardiomyopathy (HCC) 11/19/2022   Nonobstructive atherosclerosis of coronary artery    NSTEMI (non-ST elevated myocardial infarction) (HCC)    Orthostatic hypotension 11/18/2022   TIA (transient ischemic attack)      Current Outpatient Medications:    atorvastatin  (LIPITOR) 80 MG tablet, Take 1 tablet (80 mg total) by mouth daily., Disp: 90 tablet, Rfl: 3   carvedilol  (COREG ) 6.25 MG tablet, Take 1 tablet (6.25 mg total) by mouth 2 (two) times daily., Disp: 120 tablet, Rfl: 3   clopidogrel  (PLAVIX ) 75 MG tablet, TAKE 1 TABLET(75 MG) BY MOUTH DAILY, Disp: 30 tablet, Rfl: 3   dapagliflozin  propanediol (FARXIGA ) 10 MG TABS tablet, TAKE 1 TABLET(10 MG) BY MOUTH DAILY, Disp: 90 tablet, Rfl: 3   ezetimibe  (ZETIA ) 10 MG tablet, TAKE 1 TABLET(10 MG) BY MOUTH DAILY, Disp: 30 tablet, Rfl: 3   sacubitril -valsartan  (ENTRESTO ) 49-51 MG, Take 1 tablet by mouth 2 (two) times daily., Disp: 60 tablet, Rfl:  3  Review of Systems:    Respiratory: Patient denies any shortness of breath Cardiovascular: Patient denies any chest pain or edema  Physical Exam:  Vitals:   12/28/23 1014  BP: 109/79  Pulse: 60  Temp: 97.8 F (36.6 C)  TempSrc: Oral  SpO2: 99%  Weight: 147 lb 3.2 oz (66.8 kg)  Height: 6' (1.829 m)   General: Patient is sitting comfortably in the room  Head: Normocephalic, atraumatic  Cardio: Regular rate and rhythm, grade 2/6 systolic murmur appreciated to left sternal border Pulmonary: Clear to ausculation bilaterally with no rales, rhonchi, and crackles  Extremities: No lower extremity edema appreciated   Assessment & Plan:   Chronic HFrEF (heart failure with reduced ejection fraction) (HCC) Past medical history of HFrEF.  Last EF is 35 to 45% in December 2024.  His current medications include Coreg  6.25 mg twice daily, Farxiga  10 mg daily, Entresto  49-51 mg twice daily.  Since his last visit he denies any chest pain, shortness of breath, or heart failure exacerbation.  On exam today patient has clear lung sounds as well as looks euvolemic.  Given vitals look normal with pulse of 60 and blood pressure 109/79, will not titrate medications at this time.  He does report he has been out of his Farxiga .  I will refill this.  Plan: - Continue Coreg  6.25 mg twice daily, Farxiga  10 mg daily, Entresto  49-51 mg twice daily -Follow-up BMP as at previous  visit he had Entresto  titrated up - Follow-up in 3 months  Essential hypertension Patient presents for 16-month follow-up.  Blood pressure today is 109/79.  He reports adherence to his medications.  Plan: - Continue Entresto  49-51 mg twice daily - Continue Coreg  6.25 mg twice daily - Well-controlled - Follow-up BMP  - Follow-up in 3 months  Patient discussed with Dr. Bevelyn Bryant  Jonelle Neri, DO PGY-2 Internal Medicine Resident

## 2023-12-28 NOTE — Assessment & Plan Note (Signed)
 Past medical history of HFrEF.  Last EF is 35 to 45% in December 2024.  His current medications include Coreg  6.25 mg twice daily, Farxiga  10 mg daily, Entresto  49-51 mg twice daily.  Since his last visit he denies any chest pain, shortness of breath, or heart failure exacerbation.  On exam today patient has clear lung sounds as well as looks euvolemic.  Given vitals look normal with pulse of 60 and blood pressure 109/79, will not titrate medications at this time.  He does report he has been out of his Farxiga .  I will refill this.  Plan: - Continue Coreg  6.25 mg twice daily, Farxiga  10 mg daily, Entresto  49-51 mg twice daily -Follow-up BMP as at previous visit he had Entresto  titrated up - Follow-up in 3 months

## 2023-12-28 NOTE — Patient Instructions (Signed)
 Benen, Weida you for allowing me to take part in your care today.  Here are your instructions.  1. Please come back in 3 months.  2. I will call you with the results of your labs   3. Please come back earlier if you have any other concerns before your next appointment.    PLEASE BRING YOUR MEDICATIONS TO EVERY APPOINTMENT  Thank you, Dr. Lydia Sams  If you have any other questions please contact the internal medicine clinic at [phone] If it is after hours, please call the [facility] at [phone] and then ask the person who picks up for the resident on call.

## 2023-12-29 LAB — BMP8+ANION GAP
Anion Gap: 14 mmol/L (ref 10.0–18.0)
BUN/Creatinine Ratio: 13 (ref 10–24)
BUN: 11 mg/dL (ref 8–27)
CO2: 22 mmol/L (ref 20–29)
Calcium: 9.1 mg/dL (ref 8.6–10.2)
Chloride: 106 mmol/L (ref 96–106)
Creatinine, Ser: 0.88 mg/dL (ref 0.76–1.27)
Glucose: 90 mg/dL (ref 70–99)
Potassium: 4.2 mmol/L (ref 3.5–5.2)
Sodium: 142 mmol/L (ref 134–144)
eGFR: 97 mL/min/{1.73_m2} (ref >59)

## 2023-12-31 ENCOUNTER — Ambulatory Visit: Payer: Self-pay | Admitting: Student

## 2024-01-01 ENCOUNTER — Telehealth: Payer: Self-pay

## 2024-01-01 NOTE — Progress Notes (Signed)
 Internal Medicine Clinic Attending  Case discussed with the resident at the time of the visit.  We reviewed the resident's history and exam and pertinent patient test results.  I agree with the assessment, diagnosis, and plan of care documented in the resident's note.

## 2024-01-01 NOTE — Telephone Encounter (Signed)
 Sean Leblanc (Key: [id number]) PA Case ID #: [id number] Need Help? Call us  at [phone] Outcome Approved today by [facility] Medicaid 2017 Approved. This drug has been approved. Approved quantity: 30 units per 30 day(s). The drug has been approved from 12/18/2023 to 12/31/2024. Please call the pharmacy to process your prescription claim. Generic or biosimilar substitution may be required when available and preferred on the formulary. Effective Date: 12/18/2023 Authorization Expiration Date: 12/31/2024 Drug Farxiga  10MG  tablets ePA cloud logo Form [facility] Medicaid of [place]  Electronic Prior Authorization Request Form [phone] NCPDP)

## 2024-01-01 NOTE — Telephone Encounter (Addendum)
 Prior Authorization for patient (Farxiga  10MG  tablets) came through on cover my meds was submitted with last office notes awaiting approval or denial.  [id number]

## 2024-01-17 ENCOUNTER — Other Ambulatory Visit: Payer: Self-pay

## 2024-01-17 DIAGNOSIS — I10 Essential (primary) hypertension: Secondary | ICD-10-CM

## 2024-01-17 DIAGNOSIS — I5021 Acute systolic (congestive) heart failure: Secondary | ICD-10-CM

## 2024-01-17 DIAGNOSIS — I429 Cardiomyopathy, unspecified: Secondary | ICD-10-CM

## 2024-01-17 MED ORDER — CARVEDILOL 6.25 MG PO TABS: 6.2500 mg | ORAL_TABLET | Freq: Two times a day (BID) | ORAL | 3 refills | Status: DC

## 2024-01-17 NOTE — Telephone Encounter (Signed)
 Medication sent to pharmacy

## 2024-01-20 DIAGNOSIS — Z419 Encounter for procedure for purposes other than remedying health state, unspecified: Secondary | ICD-10-CM | POA: Diagnosis not present

## 2024-01-24 ENCOUNTER — Other Ambulatory Visit: Payer: Self-pay

## 2024-01-24 MED ORDER — CLOPIDOGREL BISULFATE 75 MG PO TABS: 75.0000 mg | ORAL_TABLET | Freq: Every day | ORAL | 2 refills | Status: AC

## 2024-01-29 ENCOUNTER — Other Ambulatory Visit: Payer: Self-pay

## 2024-01-29 MED ORDER — EZETIMIBE 10 MG PO TABS: 10.0000 mg | ORAL_TABLET | Freq: Every day | ORAL | 3 refills | Status: DC

## 2024-02-03 ENCOUNTER — Other Ambulatory Visit (HOSPITAL_COMMUNITY): Payer: Self-pay

## 2024-02-20 DIAGNOSIS — Z419 Encounter for procedure for purposes other than remedying health state, unspecified: Secondary | ICD-10-CM | POA: Diagnosis not present

## 2024-03-12 ENCOUNTER — Other Ambulatory Visit: Payer: Self-pay | Admitting: Student

## 2024-03-12 NOTE — Telephone Encounter (Signed)
 Medication sent to pharmacy

## 2024-03-13 ENCOUNTER — Other Ambulatory Visit: Payer: Self-pay | Admitting: *Deleted

## 2024-03-13 NOTE — Telephone Encounter (Addendum)
 Refill request for spironolactone  25 mg take one-half tab daily Medication no longer on list  Request denied at this time

## 2024-03-13 NOTE — Telephone Encounter (Signed)
 Hey Dr.Caraballo the pharmacy is requesting the brand name medication Entresto . Can you send in a new rx?

## 2024-03-17 ENCOUNTER — Encounter (HOSPITAL_COMMUNITY): Payer: Self-pay

## 2024-03-17 ENCOUNTER — Emergency Department (HOSPITAL_COMMUNITY)
Admission: EM | Admit: 2024-03-17 | Discharge: 2024-03-17 | Disposition: A | Discharge status: Home / Self Care | Attending: Emergency Medicine | Admitting: Emergency Medicine

## 2024-03-17 ENCOUNTER — Emergency Department (HOSPITAL_COMMUNITY)

## 2024-03-17 ENCOUNTER — Other Ambulatory Visit: Payer: Self-pay

## 2024-03-17 DIAGNOSIS — R079 Chest pain, unspecified: Secondary | ICD-10-CM | POA: Diagnosis not present

## 2024-03-17 DIAGNOSIS — I5020 Unspecified systolic (congestive) heart failure: Secondary | ICD-10-CM | POA: Insufficient documentation

## 2024-03-17 DIAGNOSIS — R1013 Epigastric pain: Secondary | ICD-10-CM | POA: Diagnosis not present

## 2024-03-17 DIAGNOSIS — Z7902 Long term (current) use of antithrombotics/antiplatelets: Secondary | ICD-10-CM | POA: Insufficient documentation

## 2024-03-17 DIAGNOSIS — Z8673 Personal history of transient ischemic attack (TIA), and cerebral infarction without residual deficits: Secondary | ICD-10-CM | POA: Insufficient documentation

## 2024-03-17 DIAGNOSIS — Z79899 Other long term (current) drug therapy: Secondary | ICD-10-CM | POA: Diagnosis not present

## 2024-03-17 DIAGNOSIS — I110 Hypertensive heart disease with heart failure: Secondary | ICD-10-CM | POA: Diagnosis not present

## 2024-03-17 DIAGNOSIS — R0789 Other chest pain: Secondary | ICD-10-CM | POA: Diagnosis not present

## 2024-03-17 LAB — BASIC METABOLIC PANEL WITH GFR
Anion gap: 7 (ref 5–15)
BUN: 7 mg/dL — ABNORMAL LOW (ref 8–23)
CO2: 24 mmol/L (ref 22–32)
Calcium: 8.8 mg/dL — ABNORMAL LOW (ref 8.9–10.3)
Chloride: 103 mmol/L (ref 98–111)
Creatinine, Ser: 0.78 mg/dL (ref 0.61–1.24)
GFR, Estimated: >60 mL/min (ref >60)
Glucose, Bld: 105 mg/dL — ABNORMAL HIGH (ref 70–99)
Potassium: 3.9 mmol/L (ref 3.5–5.1)
Sodium: 134 mmol/L — ABNORMAL LOW (ref 135–145)

## 2024-03-17 LAB — CBC
HCT: 45.1 % (ref 39.0–52.0)
Hemoglobin: 14.3 g/dL (ref 13.0–17.0)
MCH: 28.6 pg (ref 26.0–34.0)
MCHC: 31.7 g/dL (ref 30.0–36.0)
MCV: 90.2 fL (ref 80.0–100.0)
Platelets: 279 K/uL (ref 150–400)
RBC: 5 MIL/uL (ref 4.22–5.81)
RDW: 12.4 % (ref 11.5–15.5)
WBC: 6.6 K/uL (ref 4.0–10.5)
nRBC: 0 % (ref 0.0–0.2)

## 2024-03-17 LAB — RESP PANEL BY RT-PCR (RSV, FLU A&B, COVID)  RVPGX2
Influenza A by PCR: NEGATIVE
Influenza B by PCR: NEGATIVE
Resp Syncytial Virus by PCR: NEGATIVE
SARS Coronavirus 2 by RT PCR: NEGATIVE

## 2024-03-17 LAB — BRAIN NATRIURETIC PEPTIDE: B Natriuretic Peptide: 19.3 pg/mL (ref 0.0–100.0)

## 2024-03-17 LAB — TROPONIN I (HIGH SENSITIVITY): Troponin I (High Sensitivity): 4 ng/L (ref <18)

## 2024-03-17 MED ORDER — ALUM & MAG HYDROXIDE-SIMETH 200-200-20 MG/5ML PO SUSP
30.0000 mL | Freq: Once | ORAL | Status: AC
  Administered 2024-03-17: 30 mL via ORAL
  Filled 2024-03-17: qty 30

## 2024-03-17 MED ORDER — FAMOTIDINE 20 MG PO TABS
20.0000 mg | ORAL_TABLET | Freq: Two times a day (BID) | ORAL | 0 refills | Status: DC
Start: 2024-03-17 — End: 2024-03-31

## 2024-03-17 MED ORDER — FAMOTIDINE 20 MG PO TABS
20.0000 mg | ORAL_TABLET | Freq: Once | ORAL | Status: AC
  Administered 2024-03-17: 20 mg via ORAL
  Filled 2024-03-17: qty 1

## 2024-03-17 NOTE — ED Provider Notes (Signed)
 [place] [facility] AT [facility] Provider Note   CSN: [id number] Arrival date & time: 03/17/24  1113     Patient presents with: Chest Pain   Sean Leblanc is a 63 y.o. male.   Pt is a 62y/o male with hx of HFrEF 35-40% on entresto  and plavix  and coreg , hypertension, TIA, hyperlipidemia, history of cocaine use but had not used tobacco or cocaine for quite sometime who is presenting today with complaint of chest pain.  He reports that last night about 8:00 is when the pain started.  He had eaten some dinner and then was lying down on the bed watching some TV when he started having a pain in the center of his chest which then radiated into the right arm and then to the left arm causing it to be sore.  He also complained of some upper abdominal discomfort but denied any shortness of breath, nausea, vomiting.  He does report for the last 3 days he has had some diarrhea and even noticed a little bit of blood in the stool today but denies any lower abdominal pain or urinary complaints.  He has not had fever, cough or wheezing.  He did feel like his abdomen was mildly distended.  He does not check his weight regularly but denies any swelling in his legs.  He reports now the pain has improved and now just comes and goes in the same location but does report his arms still feel achy.  No recent medication changes and he is compliant and has not missed any doses of medicine.  The history is provided by the patient.  Chest Pain      Prior to Admission medications   Medication Sig Start Date End Date Taking? Authorizing Provider  famotidine  (PEPCID ) 20 MG tablet Take 1 tablet (20 mg total) by mouth 2 (two) times daily for 14 days. 03/17/24 03/31/24 Yes Mandee Pluta, [place], MD  atorvastatin  (LIPITOR) 80 MG tablet Take 1 tablet (80 mg total) by mouth daily. 10/23/23 10/22/24  Gregary Sharper, MD  carvedilol  (COREG ) 6.25 MG tablet Take 1 tablet (6.25 mg total) by mouth 2 (two) times daily.  01/17/24   Tawkaliyar, Roya, DO  clopidogrel  (PLAVIX ) 75 MG tablet Take 1 tablet (75 mg total) by mouth daily. 01/24/24   Tawkaliyar, Roya, DO  dapagliflozin  propanediol (FARXIGA ) 10 MG TABS tablet TAKE 1 TABLET(10 MG) BY MOUTH DAILY 12/28/23   Tobie Gaines, DO  ezetimibe  (ZETIA ) 10 MG tablet Take 1 tablet (10 mg total) by mouth daily. 01/29/24   McLendon, Michael, MD  sacubitril -valsartan  (ENTRESTO ) 49-51 MG TAKE 1 TABLET BY MOUTH TWICE DAILY 03/12/24   Elnora Ip, MD    Allergies: Patient has no known allergies.    Review of Systems  Cardiovascular:  Positive for chest pain.    Updated Vital Signs BP (!) 146/96   Pulse (!) 49   Temp (!) 97.5 F (36.4 C)   Resp 14   Ht 6' (1.829 m)   Wt 65.8 kg   SpO2 100%   BMI 19.67 kg/m   Physical Exam Vitals and nursing note reviewed.  Constitutional:      General: He is not in acute distress.    Appearance: He is well-developed.  HENT:     Head: Normocephalic and atraumatic.  Eyes:     Conjunctiva/sclera: Conjunctivae normal.     Pupils: Pupils are equal, round, and reactive to light.  Cardiovascular:     Rate and Rhythm: Normal rate and  regular rhythm.     Pulses: Normal pulses.     Heart sounds: No murmur heard. Pulmonary:     Effort: Pulmonary effort is normal. No respiratory distress.     Breath sounds: Normal breath sounds. No wheezing or rales.  Chest:     Chest wall: No tenderness.  Abdominal:     General: There is no distension.     Palpations: Abdomen is soft.     Tenderness: There is abdominal tenderness in the epigastric area. There is no guarding or rebound.     Comments: Mild epigastric discomfort  Musculoskeletal:        General: No tenderness. Normal range of motion.     Cervical back: Normal range of motion and neck supple.     Right lower leg: No edema.     Left lower leg: No edema.  Skin:    General: Skin is warm and dry.     Findings: No erythema or rash.  Neurological:     Mental Status: He is  alert and oriented to person, place, and time. Mental status is at baseline.  Psychiatric:        Behavior: Behavior normal.     (all labs ordered are listed, but only abnormal results are displayed) Labs Reviewed  BASIC METABOLIC PANEL WITH GFR - Abnormal; Notable for the following components:      Result Value   Sodium 134 (*)    Glucose, Bld 105 (*)    BUN 7 (*)    Calcium  8.8 (*)    All other components within normal limits  RESP PANEL BY RT-PCR (RSV, FLU A&B, COVID)  RVPGX2  CBC  BRAIN NATRIURETIC PEPTIDE  TROPONIN I (HIGH SENSITIVITY)    EKG: EKG Interpretation Date/Time:  Sunday March 17 2024 11:30:25 EDT Ventricular Rate:  56 PR Interval:  170 QRS Duration:  102 QT Interval:  403 QTC Calculation: 389 R Axis:   -10  Text Interpretation: Sinus rhythm Borderline T wave abnormalities No significant change since last tracing Confirmed by Doretha Folks ([id number]) on 03/17/2024 12:19:14 PM  Radiology: [facility] Chest 2 View Result Date: 03/17/2024 CLINICAL DATA:  Chest pain. EXAM: CHEST - 2 VIEW COMPARISON:  Nov 18, 2022 FINDINGS: The heart size and mediastinal contours are within normal limits. Both lungs are clear. The visualized skeletal structures are unremarkable. IMPRESSION: No active cardiopulmonary disease. Electronically Signed   By: Suzen Dials M.D.   On: 03/17/2024 12:35     Procedures   Medications Ordered in the ED  famotidine  (PEPCID ) tablet 20 mg (has no administration in time range)  alum & mag hydroxide-simeth (MAALOX/MYLANTA) 200-200-20 MG/5ML suspension 30 mL (30 mLs Oral Given 03/17/24 1352)                                    Medical Decision Making Amount and/or Complexity of Data Reviewed External Data Reviewed: notes. Labs: ordered. Decision-making details documented in ED Course. Radiology: ordered and independent interpretation performed. Decision-making details documented in ED Course. ECG/medicine tests: ordered and independent  interpretation performed. Decision-making details documented in ED Course.  Risk OTC drugs.   Pt with multiple medical problems and comorbidities and presenting today with a complaint that caries a high risk for morbidity and mortality.  Here today with the above complaint.  Concern for ACS, CHF exacerbation, GERD, acute GI pathology, infectious etiology such as COVID, pneumonia.  Low suspicion for PE  at this time, tamponade or dissection.  Low suspicion for pancreatitis or hepatitis/cholecystitis.  I independently interpreted patient's labs and EKG.  EKG without acute changes today and no ST findings concerning for MI.  CBC within normal limits, BMP without acute findings and respiratory viral panel was negative.  Troponin was normal today and BNP was normal at 19.  I have independently visualized and interpreted pt's images today. Chest x-ray within normal limits.  Given patient's pain has been persistent since last night with a normal troponin of 4 do not feel that he needs a repeat.  Will start patient on Pepcid .  He was given return precautions.  He has follow-up with his doctor on Friday.  At this time do not feel that patient needs further testing or admission.  He is comfortable with going home      Final diagnoses:  Nonspecific chest pain    ED Discharge Orders          Ordered    famotidine  (PEPCID ) 20 MG tablet  2 times daily        03/17/24 1432               Doretha Folks, MD 03/17/24 1432

## 2024-03-17 NOTE — ED Triage Notes (Addendum)
 Pt came in pov d/t cp that started last night. On the center, radiated to left arm then to right arm. Described the pain as aching. Food made it worse. Night quil and sleep made it better. No sob. No n/v. Little watery stool.  Hx acid reflux. A&O X4.

## 2024-03-17 NOTE — Discharge Instructions (Addendum)
 No signs of heart attack today.  Your kidneys look good and your x-ray was normal without any signs of pneumonia.  You can take Tylenol  as needed for aches and pains but also pick up Pepcid  at the pharmacy and take it twice a day for the next 2 weeks.  Continue all your current medications.  If you start having high fever, persistent vomiting, severe pain in 1 area of your abdomen, passing out or developing shortness of breath you should return to the emergency room.

## 2024-03-22 ENCOUNTER — Ambulatory Visit: Payer: Self-pay | Admitting: Student

## 2024-03-22 VITALS — BP 114/80 | HR 59 | Temp 97.7°F | Ht 72.0 in | Wt 152.6 lb

## 2024-03-22 DIAGNOSIS — Z79899 Other long term (current) drug therapy: Secondary | ICD-10-CM | POA: Diagnosis not present

## 2024-03-22 DIAGNOSIS — Z7902 Long term (current) use of antithrombotics/antiplatelets: Secondary | ICD-10-CM | POA: Diagnosis not present

## 2024-03-22 DIAGNOSIS — Z87898 Personal history of other specified conditions: Secondary | ICD-10-CM | POA: Diagnosis not present

## 2024-03-22 DIAGNOSIS — Z87891 Personal history of nicotine dependence: Secondary | ICD-10-CM

## 2024-03-22 DIAGNOSIS — I429 Cardiomyopathy, unspecified: Secondary | ICD-10-CM | POA: Diagnosis not present

## 2024-03-22 DIAGNOSIS — Z8673 Personal history of transient ischemic attack (TIA), and cerebral infarction without residual deficits: Secondary | ICD-10-CM | POA: Diagnosis not present

## 2024-03-22 DIAGNOSIS — F1011 Alcohol abuse, in remission: Secondary | ICD-10-CM

## 2024-03-22 DIAGNOSIS — Z1211 Encounter for screening for malignant neoplasm of colon: Secondary | ICD-10-CM

## 2024-03-22 DIAGNOSIS — K219 Gastro-esophageal reflux disease without esophagitis: Secondary | ICD-10-CM

## 2024-03-22 DIAGNOSIS — I5022 Chronic systolic (congestive) heart failure: Secondary | ICD-10-CM

## 2024-03-22 DIAGNOSIS — Z419 Encounter for procedure for purposes other than remedying health state, unspecified: Secondary | ICD-10-CM | POA: Diagnosis not present

## 2024-03-22 MED ORDER — FAMOTIDINE 20 MG PO TABS
20.0000 mg | ORAL_TABLET | Freq: Two times a day (BID) | ORAL | 11 refills | Status: AC
Start: 2024-03-22 — End: 2024-09-18

## 2024-03-22 NOTE — Progress Notes (Signed)
 Subjective:  CC: chronic condition follow up  HPI:  [name] is a 63 y.o. male with a past medical history stated below and presents today for blood pressure, health maintenance.. Please see problem based assessment and plan for additional details.  Past Medical History:  Diagnosis Date   Abnormal EKG    Acid reflux 06/30/2023   Anginal equivalent (HCC)    Atherosclerosis of aorta (HCC)    Cardiomyopathy (HCC)    CHF (congestive heart failure) (HCC)    Cocaine use 11/19/2022   Continuous dependence on cigarette smoking    Coronary artery spasm (HCC) 02/26/2021   Coronary atherosclerosis due to calcified coronary lesion    Diplopia 06/12/2023   Dyspnea on exertion    ETOH abuse 11/19/2022   Hyperlipidemia    Hypertension    Hypertensive urgency 03/04/2016   Inclusion cyst 07/08/2020   Marijuana use 11/19/2022   Nonischemic cardiomyopathy (HCC) 11/19/2022   Nonobstructive atherosclerosis of coronary artery    NSTEMI (non-ST elevated myocardial infarction) (HCC)    Orthostatic hypotension 11/18/2022   TIA (transient ischemic attack)     Current Outpatient Medications on File Prior to Visit  Medication Sig Dispense Refill   atorvastatin  (LIPITOR) 80 MG tablet Take 1 tablet (80 mg total) by mouth daily. 90 tablet 3   carvedilol  (COREG ) 6.25 MG tablet Take 1 tablet (6.25 mg total) by mouth 2 (two) times daily. 120 tablet 3   clopidogrel  (PLAVIX ) 75 MG tablet Take 1 tablet (75 mg total) by mouth daily. 90 tablet 2   dapagliflozin  propanediol (FARXIGA ) 10 MG TABS tablet TAKE 1 TABLET(10 MG) BY MOUTH DAILY 90 tablet 3   ezetimibe  (ZETIA ) 10 MG tablet Take 1 tablet (10 mg total) by mouth daily. 90 tablet 3   sacubitril -valsartan  (ENTRESTO ) 49-51 MG TAKE 1 TABLET BY MOUTH TWICE DAILY 60 tablet 3   No current facility-administered medications on file prior to visit.    Family History  Problem Relation Age of Onset   Heart attack Mother    Cancer Father      Social History   Socioeconomic History   Marital status: Single    Spouse name: Not on file   Number of children: Not on file   Years of education: Not on file   Highest education level: Not on file  Occupational History   Not on file  Tobacco Use   Smoking status: Former    Current packs/day: 0.40    Average packs/day: 0.4 packs/day for 10.0 years (4.0 ttl pk-yrs)    Types: Cigarettes   Smokeless tobacco: Never   Tobacco comments:    1-2 per week   Vaping Use   Vaping status: Never Used  Substance and Sexual Activity   Alcohol use: Yes    Comment: 2 X weekly - 6pack - 12 pack   Drug use: Not Currently    Types: Marijuana, Codeine    Comment: Rarely.   Sexual activity: Not on file  Other Topics Concern   Not on file  Social History Narrative   Not on file   Social Drivers of Health   Financial Resource Strain: Not on file  Food Insecurity: No Food Insecurity (06/12/2023)   Hunger Vital Sign    Worried About Running Out of Food in the Last Year: Never true    Ran Out of Food in the Last Year: Never true  Transportation Needs: No Transportation Needs (06/12/2023)   PRAPARE - Transportation    Lack  of Transportation (Medical): No    Lack of Transportation (Non-Medical): No  Physical Activity: Not on file  Stress: Not on file  Social Connections: Not on file  Intimate Partner Violence: Not At Risk (06/12/2023)   Humiliation, Afraid, Rape, and Kick questionnaire    Fear of Current or Ex-Partner: No    Emotionally Abused: No    Physically Abused: No    Sexually Abused: No    Review of Systems: ROS negative except for what is noted on the assessment and plan.  Objective:   Vitals:   03/22/24 0814  BP: 114/80  Pulse: (!) 59  Temp: 97.7 F (36.5 C)  TempSrc: Oral  SpO2: 98%  Weight: 152 lb 9.6 oz (69.2 kg)  Height: 6' (1.829 m)    Physical Exam: Constitutional: well-appearing man sitting in chair, in no acute distress HENT: normocephalic atraumatic,  mucous membranes moist Eyes: conjunctiva non-erythematous Neck: supple, no neck vein distension at rest Cardiovascular: regular rate and rhythm, no m/r/g, no LE edema Pulmonary/Chest: normal work of breathing on room air, lungs clear to auscultation bilaterally Abdominal: soft, non-tender, non-distended MSK: normal bulk and tone Neurological: alert & oriented x 3,  normal gait Skin: warm and dry Psych: pleasant mood and affect       Assessment & Plan:   Cardiomyopathy- suspect HTN CM- r/o ischemic Last TTE in 2024 with LVEF 35-450% with LV global hypokinesis thought to be due to polysubstance use.  Mitral and AR. Euvolemic today. Last seen by Cards 11/2022; patient was considered for spironolactone  then and was to return to their offices 2 weeks after that visit but has been lost to their follow up. Seen at [facility].  After that, patient had a hospitalization on 06/2023 for INO secondary to cocaine, patient's medications included lower dose of Entresto  and spironolactone . This was later discontinued/fell of 10/2023.   Denies chest pain, dypnea at rest of with exertion.  - Will refer to cardiology - Will order repeat TTE - Continue Coreg  6/25mg  BID -Farxiga  10 mg daily - Entresto  49-51 mg BID - BMP today; would favor decreasing Entresto  and adding MRA given low normal BP - Follow in 3 months unless significant lab abnormalities   History of alcohol use In remission.  History of tobacco abuse S/p chantix  for 3 months. In remission for ~6 months post Chantix . Declines cravings. Discussed nee to let us  know if recurrence as we can trial another 12-weeks of Chantix    History of transient ischemic attack (TIA) On plavix  75 mg daily  GERD (gastroesophageal reflux disease) Patient with chronic GERD (10+ years), prior smoker (in remission for 6 months), >52 years old, and male with worsening of GERD. Seen in the ED, as this was mistaken for chest pain with negative BNP and cardiac enzymes.  Famotidine  has improved presentation. He also had prior history of alcohol use disorder. No dysphagia to liquids. Discussed referral to GI for EGD for Barrett's esophagus screening  Colon cancer screening Referral to colon cancer screening    Return in about 3 months (around 06/21/2024) for Heart failure, GERD, chronic medical conditions.  Patient discussed with Dr. Francesco Hadassah Kristy Rosario, MD [facility]

## 2024-03-22 NOTE — Patient Instructions (Signed)
 Thank you, Mr.Claudia T Salamone for allowing us  to provide your care today. Today we discussed .    Referrals: - Heart failure -please continue taking your medications.  Take note of the medications on the list here and if you do not have them at home contact your pharmacy to request refills - I have also sent it today heart failure Dr., Rosine should get scheduled for an ultrasound of your heart and an appointment with them in the next couple of weeks.  - I am also sending you to the gastroenterologist.  Your acid reflux needs to be further worked up.  At that time, they may choose to also talk about screening for colon cancers.  Because you have Medicaid, some of these authorizations need to go through the insurance and may take some time.  I do want you to call us  back and there is any problems with and you have not been scheduled.   Call us  and head to the closest ER if you have another bout of chest pain.  Congratulations on stopping tobacco use!  Let us  know if the cravings come back and we can get you back on the Chantix  MyChart/Authentication/Login?  Please follow-up in: 3 months    We look forward to seeing you next time. Please call our clinic at [phone] if you have any questions or concerns. The best time to call is Monday-Friday from 9am-4pm, but there is someone available 24/7. If after hours or the weekend, call the main hospital number and ask for the Internal Medicine Resident On-Call. If you need medication refills, please notify your pharmacy one week in advance and they will send us  a request.   Thank you for letting us  take part in your care. Wishing you the best!  Elnora Ip, MD 03/22/2024, 8:43 AM [facility]

## 2024-03-25 NOTE — Assessment & Plan Note (Signed)
 Referral to colon cancer screening

## 2024-03-25 NOTE — Assessment & Plan Note (Signed)
 Patient with chronic GERD (10+ years), prior smoker (in remission for 6 months), >63 years old, and male with worsening of GERD. Seen in the ED, as this was mistaken for chest pain with negative BNP and cardiac enzymes. Famotidine  has improved presentation. He also had prior history of alcohol use disorder. No dysphagia to liquids. Discussed referral to GI for EGD for Barrett's esophagus screening

## 2024-03-25 NOTE — Assessment & Plan Note (Signed)
 In remission.

## 2024-03-25 NOTE — Assessment & Plan Note (Signed)
 Last TTE in 2024 with LVEF 35-450% with LV global hypokinesis thought to be due to polysubstance use.  Mitral and AR. Euvolemic today. Last seen by Cards 11/2022; patient was considered for spironolactone  then and was to return to their offices 2 weeks after that visit but has been lost to their follow up. Seen at [facility].  After that, patient had a hospitalization on 06/2023 for INO secondary to cocaine, patient's medications included lower dose of Entresto  and spironolactone . This was later discontinued/fell of 10/2023.   Denies chest pain, dypnea at rest of with exertion.  - Will refer to cardiology - Will order repeat TTE - Continue Coreg  6/25mg  BID -Farxiga  10 mg daily - Entresto  49-51 mg BID - BMP today; would favor decreasing Entresto  and adding MRA given low normal BP - Follow in 3 months unless significant lab abnormalities

## 2024-03-25 NOTE — Assessment & Plan Note (Signed)
 S/p chantix  for 3 months. In remission for ~6 months post Chantix . Declines cravings. Discussed nee to let us  know if recurrence as we can trial another 12-weeks of Chantix 

## 2024-03-25 NOTE — Assessment & Plan Note (Signed)
On plavix 75 mg daily. 

## 2024-03-26 NOTE — Progress Notes (Signed)
 Internal Medicine Clinic Attending  Case discussed with the resident at the time of the visit.  We reviewed the resident's history and exam and pertinent patient test results.  I agree with the assessment, diagnosis, and plan of care documented in the resident's note.

## 2024-04-17 ENCOUNTER — Other Ambulatory Visit: Payer: Self-pay

## 2024-04-17 DIAGNOSIS — I10 Essential (primary) hypertension: Secondary | ICD-10-CM

## 2024-04-17 DIAGNOSIS — I429 Cardiomyopathy, unspecified: Secondary | ICD-10-CM

## 2024-04-17 DIAGNOSIS — I5021 Acute systolic (congestive) heart failure: Secondary | ICD-10-CM

## 2024-04-17 NOTE — Telephone Encounter (Unsigned)
 Copied from CRM [id number]. Topic: Clinical - Medication Refill >> Apr 17, 2024  4:14 PM Susanna ORN wrote: Medication: dapagliflozin  propanediol (FARXIGA ) 10 MG TABS tablet  Has the patient contacted their pharmacy? No (Agent: If no, request that the patient contact the pharmacy for the refill. If patient does not wish to contact the pharmacy document the reason why and proceed with request.) (Agent: If yes, when and what did the pharmacy advise?)  This is the patient's preferred pharmacy:  [facility] [address] [place] - [address] AT [address] [place] [address] Phone: [phone] Fax: [phone]  Is this the correct pharmacy for this prescription? Yes If no, delete pharmacy and type the correct one.   Has the prescription been filled recently? Yes  Is the patient out of the medication? Yes, been out for two days.   Has the patient been seen for an appointment in the last year OR does the patient have an upcoming appointment? Yes  Can we respond through MyChart? No  Agent: Please be advised that Rx refills may take up to 3 business days. We ask that you follow-up with your pharmacy.

## 2024-04-18 MED ORDER — DAPAGLIFLOZIN PROPANEDIOL 10 MG PO TABS: ORAL_TABLET | ORAL | 3 refills | Status: AC

## 2024-05-02 ENCOUNTER — Ambulatory Visit (HOSPITAL_COMMUNITY)
Admission: RE | Admit: 2024-05-02 | Discharge: 2024-05-02 | Disposition: A | Discharge status: Home / Self Care | Source: Ambulatory Visit | Attending: Internal Medicine | Admitting: Internal Medicine

## 2024-05-02 DIAGNOSIS — F17210 Nicotine dependence, cigarettes, uncomplicated: Secondary | ICD-10-CM | POA: Insufficient documentation

## 2024-05-02 DIAGNOSIS — R9431 Abnormal electrocardiogram [ECG] [EKG]: Secondary | ICD-10-CM | POA: Diagnosis not present

## 2024-05-02 DIAGNOSIS — I429 Cardiomyopathy, unspecified: Secondary | ICD-10-CM | POA: Insufficient documentation

## 2024-05-02 DIAGNOSIS — G459 Transient cerebral ischemic attack, unspecified: Secondary | ICD-10-CM | POA: Insufficient documentation

## 2024-05-02 DIAGNOSIS — I252 Old myocardial infarction: Secondary | ICD-10-CM | POA: Insufficient documentation

## 2024-05-02 DIAGNOSIS — R0600 Dyspnea, unspecified: Secondary | ICD-10-CM | POA: Diagnosis not present

## 2024-05-02 DIAGNOSIS — Z006 Encounter for examination for normal comparison and control in clinical research program: Secondary | ICD-10-CM

## 2024-05-02 DIAGNOSIS — I110 Hypertensive heart disease with heart failure: Secondary | ICD-10-CM | POA: Diagnosis not present

## 2024-05-02 DIAGNOSIS — E785 Hyperlipidemia, unspecified: Secondary | ICD-10-CM | POA: Diagnosis not present

## 2024-05-02 DIAGNOSIS — I340 Nonrheumatic mitral (valve) insufficiency: Secondary | ICD-10-CM | POA: Diagnosis not present

## 2024-05-02 DIAGNOSIS — I5022 Chronic systolic (congestive) heart failure: Secondary | ICD-10-CM | POA: Diagnosis not present

## 2024-05-02 DIAGNOSIS — F1910 Other psychoactive substance abuse, uncomplicated: Secondary | ICD-10-CM | POA: Insufficient documentation

## 2024-05-02 DIAGNOSIS — R0602 Shortness of breath: Secondary | ICD-10-CM | POA: Diagnosis not present

## 2024-05-02 LAB — ECHOCARDIOGRAM COMPLETE
Area-P 1/2: 3.37 cm2
Calc EF: 41.5 %
S' Lateral: 3.85 cm
Single Plane A2C EF: 38.4 %
Single Plane A4C EF: 42.3 %

## 2024-05-02 NOTE — Progress Notes (Signed)
  Echocardiogram 2D Echocardiogram has been performed.  Sean Leblanc 05/02/2024, 2:50 PM

## 2024-05-02 NOTE — Research (Signed)
 SITE: [id number]     Subject # [id number]   Subprotocol: A  Inclusion Criteria  Patients who meet all of the following criteria are eligible for enrollment as study participants:  Yes No  Age > 64 years old X   Eligible to wear Holter Study X    Exclusion Criteria  Patients who meet any of these criteria are not eligible for enrollment as study participants: Yes No  1. Receiving any mechanical (respiratory or circulatory) or renal support therapy at Screening or during Visit #1.  X  2.  Any other conditions that in the opinion of the investigators are likely to prevent compliance with the study protocol or pose a safety concern if the subject participates in the study.  X  3. Poor tolerance, namely susceptible to severe skin allergies from ECG adhesive patch application.  X   Protocol: REV H    60 minute start window         Cor device must be applied, and the study initiated, no later than 60 minutes of completing the Echocardiogram                             HH:MM  Echo completion time  14:50  2.   Cor Study start time  15:03   30-Minute execution window  Once Cor Monitoring begins, 3 QT Med ECGs and the 15-minute rest period must be completed within a 30 minute window     HH:MM  3. QT Med ECG Completion time  15:24  4. Start of 15-Min sitting rest period  14:55  5. End of 15-Min rest period  15:20  6. Time of device removal  15:33   *Continue to use the Mobile App Event feature to log the Rest period windows and follow instructions on the EF-ACT Clinical Trial  Patient Instruction Card.  Describe any anomalies in Protocol execution in the Protocol Deviation Log    Residential Zip code [phone] (First 3 digits ONLY)                                           [facility] Informed Consent   Subject Name: Sean Leblanc  Subject met inclusion and exclusion criteria.  The informed consent form, study requirements and expectations were reviewed with the subject. Subject had opportunity to  read consent and questions and concerns were addressed prior to the signing of the consent form.  The subject verbalized understanding of the trial requirements.  The subject agreed to participate in the [facility] EF trial and signed the informed consent at 14:58 on 02-May-2024.  The informed consent was obtained prior to performance of any protocol-specific procedures for the subject.  A copy of the signed informed consent was given to the subject and a copy was placed in the subject's medical record.   Sean Leblanc          Current Outpatient Medications:    atorvastatin  (LIPITOR) 80 MG tablet, Take 1 tablet (80 mg total) by mouth daily., Disp: 90 tablet, Rfl: 3   carvedilol  (COREG ) 6.25 MG tablet, Take 1 tablet (6.25 mg total) by mouth 2 (two) times daily., Disp: 120 tablet, Rfl: 3   clopidogrel  (PLAVIX ) 75 MG tablet, Take 1 tablet (75 mg total) by mouth daily., Disp: 90 tablet, Rfl: 2   dapagliflozin  propanediol (FARXIGA ) 10  MG TABS tablet, TAKE 1 TABLET(10 MG) BY MOUTH DAILY, Disp: 90 tablet, Rfl: 3   ezetimibe  (ZETIA ) 10 MG tablet, Take 1 tablet (10 mg total) by mouth daily., Disp: 90 tablet, Rfl: 3   famotidine  (PEPCID ) 20 MG tablet, Take 1 tablet (20 mg total) by mouth 2 (two) times daily., Disp: 30 tablet, Rfl: 11   sacubitril -valsartan  (ENTRESTO ) 49-51 MG, TAKE 1 TABLET BY MOUTH TWICE DAILY, Disp: 60 tablet, Rfl: 3

## 2024-05-22 DIAGNOSIS — Z419 Encounter for procedure for purposes other than remedying health state, unspecified: Secondary | ICD-10-CM | POA: Diagnosis not present

## 2024-06-19 ENCOUNTER — Encounter: Payer: Self-pay | Admitting: Cardiology

## 2024-06-19 ENCOUNTER — Ambulatory Visit: Attending: Cardiology | Admitting: Cardiology

## 2024-06-19 ENCOUNTER — Other Ambulatory Visit (HOSPITAL_COMMUNITY): Payer: Self-pay

## 2024-06-19 VITALS — BP 140/86 | HR 69 | Ht 72.0 in | Wt 157.0 lb

## 2024-06-19 DIAGNOSIS — Z8673 Personal history of transient ischemic attack (TIA), and cerebral infarction without residual deficits: Secondary | ICD-10-CM | POA: Diagnosis present

## 2024-06-19 DIAGNOSIS — I5022 Chronic systolic (congestive) heart failure: Secondary | ICD-10-CM | POA: Diagnosis present

## 2024-06-19 DIAGNOSIS — F1910 Other psychoactive substance abuse, uncomplicated: Secondary | ICD-10-CM | POA: Insufficient documentation

## 2024-06-19 DIAGNOSIS — I428 Other cardiomyopathies: Secondary | ICD-10-CM | POA: Diagnosis not present

## 2024-06-19 DIAGNOSIS — I10 Essential (primary) hypertension: Secondary | ICD-10-CM | POA: Diagnosis present

## 2024-06-19 DIAGNOSIS — I700 Atherosclerosis of aorta: Secondary | ICD-10-CM | POA: Diagnosis not present

## 2024-06-19 DIAGNOSIS — E782 Mixed hyperlipidemia: Secondary | ICD-10-CM | POA: Diagnosis present

## 2024-06-19 MED ORDER — SACUBITRIL-VALSARTAN 97-103 MG PO TABS: 1.0000 | ORAL_TABLET | Freq: Two times a day (BID) | ORAL | 1 refills | Status: AC

## 2024-06-19 MED ORDER — EZETIMIBE 10 MG PO TABS: 10.0000 mg | ORAL_TABLET | Freq: Every day | ORAL | 3 refills | Status: AC

## 2024-06-19 NOTE — Patient Instructions (Signed)
 Medication Instructions:   INCREASE entresto  to 97/103mg  twice daily   CONTINUE all other current medications   *If you need a refill on your cardiac medications before your next appointment, please call your pharmacy*  Lab Work:  Non-Fasting lab work in 1 week -- BMET, proBNP  If you have labs (blood work) drawn today and your tests are completely normal, you will receive your results only by: MyChart Message (if you have MyChart) OR A paper copy in the mail If you have any lab test that is abnormal or we need to change your treatment, we will call you to review the results.  Testing/Procedures: Your physician has requested that you have an echocardiogram. Echocardiography is a painless test that uses sound waves to create images of your heart. It provides your doctor with information about the size and shape of your heart and how well your hearts chambers and valves are working. This procedure takes approximately one hour. There are no restrictions for this procedure. Please do NOT wear cologne, perfume, aftershave, or lotions (deodorant is allowed). Please arrive 15 minutes prior to your appointment time.  Please note: We ask at that you not bring children with you during ultrasound (echo/ vascular) testing. Due to room size and safety concerns, children are not allowed in the ultrasound rooms during exams. Our front office staff cannot provide observation of children in our lobby area while testing is being conducted. An adult accompanying a patient to their appointment will only be allowed in the ultrasound room at the discretion of the ultrasound technician under special circumstances. We apologize for any inconvenience.   Follow-Up: At [facility], you and your health needs are our priority.  As part of our continuing mission to provide you with exceptional heart care, our providers are all part of one team.  This team includes your primary Cardiologist (physician) and  Advanced Practice Providers or APPs (Physician Assistants and Nurse Practitioners) who all work together to provide you with the care you need, when you need it.  Your next appointment:    6 months with Dr. Michele  We recommend signing up for the patient portal called MyChart.  Sign up information is provided on this After Visit Summary.  MyChart is used to connect with patients for Virtual Visits (Telemedicine).  Patients are able to view lab/test results, encounter notes, upcoming appointments, etc.  Non-urgent messages can be sent to your provider as well.   To learn more about what you can do with MyChart, go to [email or web address].   Other Instructions

## 2024-06-19 NOTE — Progress Notes (Unsigned)
 Cardiology Office Note:  .   ID:  Sean Leblanc, DOB Jun 28, 1961, MRN [id number] PCP:  Amilibia, Jaden, DO  Former Cardiology Providers: N/A [facility] Providers Cardiologist:  Madonna Large, DO , [facility] (established care 06/19/24) Electrophysiologist:  None  Click to update primary MD,subspecialty MD or APP then REFRESH:1}    Chief Complaint  Patient presents with   Follow-up    Heart failure    History of Present Illness: .   Sean Leblanc is a 63 y.o. African-American male whose past medical history and cardiovascular risk factors includes: Nonischemic cardiomyopathy, chronic HFrEF, aortic atherosclerosis, TIA (August 2017), hypertension, hyperlipidemia, history of polysubstance abuse (alcohol, marijuana, cocaine), noncompliance  Patient presents to the office to reestablish care he was last seen during his hospitalization in May 2024 for cardiomyopathy/HFrEF.  During the hospitalization he was hypotensive/orthostatic likely due to dehydration from excessive alcohol consumption and heart failure medication.  I last saw the patient back in 2022 when he was recently seen by other partner in 2024.  Patient presents today for 1 year follow-up visit given his nonischemic cardiomyopathy and for medication refills.  Patient states that he has quit smoking as of June 2025, congratulated on his efforts. He is also stopped doing cocaine as of February 2025, congratulated on his efforts. He has significantly cut down the use of alcohol to no more than as needed basis. He is also significantly reduced marijuana consumption as needed basis. Home blood pressures when checked around 140-150 mmHg.   Patient endorses compliance with current medical therapy without any side effects or intolerances. Outside labs from January 2025 reviewed LDL 72 mg/dL and HDL 37 mg/dL.  Patient states that he ran out of Zetia  and Entresto  for the last 2 days at least.  Review of Systems: .   Review of  Systems  Cardiovascular:  Negative for chest pain, claudication, irregular heartbeat, leg swelling, near-syncope, orthopnea, palpitations, paroxysmal nocturnal dyspnea and syncope.  Respiratory:  Negative for shortness of breath.   Hematologic/Lymphatic: Negative for bleeding problem.    Studies Reviewed:   EKG: EKG Interpretation Date/Time:  Wednesday June 19 2024 14:28:35 EST Ventricular Rate:  69 PR Interval:  164 QRS Duration:  92 QT Interval:  386 QTC Calculation: 413 R Axis:   4  Text Interpretation: Normal sinus rhythm Minimal voltage criteria for LVH, may be normal variant ( Sokolow-Lyon ) Nonspecific T wave abnormality When compared with ECG of 17-Mar-2024 11:30, No significant change was found Confirmed by Large Madonna [phone]) on 06/19/2024 3:03:15 PM  Echocardiogram: 02/2016: LVEF 35-40%. 05/2017: LVEF 40-45% 02/2021: LVEF 20-25%  Nov 18, 2022: LVEF 35-40%, indeterminate diastolic function, global hypokinesis, right ventricular systolic function mildly reduced, RV size normal, trivial MR, estimated RAP 3 mmHg  Heart catheterization: February 25, 2021: LV: 182/0, EDP 13 mmHg.  Aortic pressure 170/87, mean 117 mmHg.  There was no pressure gradient across the aortic valve. LVEF: Global hypokinesis, size upper limit of normal, EF 20 to 25%. Coronary arteries: Very mild disease in the ramus intermediate constituting 30% otherwise minimal disease in the proximal and mid LAD of 10% and circumflex proximal and mid 10%.  Right coronary artery is normal.   LM: Smooth and normal. LAD: Large vessel, gives origin to a moderate-sized D1 and several small diagonals, has minimal disease in the proximal segment. CX: Large vessel, has mild luminal irregularity in the proximal segment.   RI: Mild diffuse luminal irregularity.  Constitutes a proximal 20 to 30% stenosis. RCA: Dominant.  Normal.  RADIOLOGY: N/A  Risk Assessment/Calculations:   NA   Labs:       Latest Ref Rng &  Units 03/17/2024   11:33 AM 06/13/2023    6:37 AM 06/12/2023   12:14 PM  CBC  WBC 4.0 - 10.5 K/uL 6.6  5.5  5.5   Hemoglobin 13.0 - 17.0 g/dL 85.6  85.7  84.5   Hematocrit 39.0 - 52.0 % 45.1  43.6  47.5   Platelets 150 - 400 K/uL 279  283  323        Latest Ref Rng & Units 03/17/2024   11:33 AM 12/28/2023   10:40 AM 06/15/2023    1:49 PM  BMP  Glucose 70 - 99 mg/dL 894  90  859   BUN 8 - 23 mg/dL 7  11  14    Creatinine 0.61 - 1.24 mg/dL 9.21  9.11  8.93   BUN/Creat Ratio 10 - 24  13  13    Sodium 135 - 145 mmol/L 134  142  139   Potassium 3.5 - 5.1 mmol/L 3.9  4.2  4.2   Chloride 98 - 111 mmol/L 103  106  103   CO2 22 - 32 mmol/L 24  22  19    Calcium  8.9 - 10.3 mg/dL 8.8  9.1  9.9       Latest Ref Rng & Units 03/17/2024   11:33 AM 12/28/2023   10:40 AM 06/15/2023    1:49 PM  CMP  Glucose 70 - 99 mg/dL 894  90  859   BUN 8 - 23 mg/dL 7  11  14    Creatinine 0.61 - 1.24 mg/dL 9.21  9.11  8.93   Sodium 135 - 145 mmol/L 134  142  139   Potassium 3.5 - 5.1 mmol/L 3.9  4.2  4.2   Chloride 98 - 111 mmol/L 103  106  103   CO2 22 - 32 mmol/L 24  22  19    Calcium  8.9 - 10.3 mg/dL 8.8  9.1  9.9     Lab Results  Component Value Date   CHOL 103 06/21/2024   HDL 30 (L) 06/21/2024   LDLCALC 55 06/21/2024   TRIG 90 06/21/2024   CHOLHDL 3.4 06/21/2024   No results for input(s): LIPOA in the last 8760 hours. No components found for: NTPROBNP No results for input(s): PROBNP in the last 8760 hours. No results for input(s): TSH in the last 8760 hours.  Physical Exam:    Today's Vitals   06/19/24 1425  BP: (!) 140/86  Pulse: 69  SpO2: 96%  Weight: 157 lb (71.2 kg)  Height: 6' (1.829 m)  PainSc: 0-No pain   Body mass index is 21.29 kg/m. Wt Readings from Last 3 Encounters:  06/21/24 155 lb (70.3 kg)  06/19/24 157 lb (71.2 kg)  03/22/24 152 lb 9.6 oz (69.2 kg)    Physical Exam  Constitutional: No distress.  hemodynamically stable  Neck: No JVD present.  Cardiovascular:  Normal rate, regular rhythm, S1 normal and S2 normal. Exam reveals no gallop, no S3 and no S4.  No murmur heard. Pulmonary/Chest: Effort normal and breath sounds normal. No stridor. He has no wheezes. He has no rales.  Musculoskeletal:        General: No edema.     Cervical back: Neck supple.  Skin: Skin is warm.     Impression & Recommendation(s):  Impression:   ICD-10-CM   1. Chronic HFrEF (heart failure with reduced ejection  fraction) (HCC)  I50.22 EKG 12-Lead    Basic metabolic panel with GFR    Pro b natriuretic peptide (BNP)    ECHOCARDIOGRAM COMPLETE    2. NICM (nonischemic cardiomyopathy) (HCC)  I42.8     3. Essential hypertension  I10     4. Mixed hyperlipidemia  E78.2     5. Polysubstance abuse (HCC)  F19.10     6. Hx-TIA (transient ischemic attack)  Z86.73     7. Atherosclerosis of aorta  I70.0        Recommendation(s):  Chronic HFrEF (heart failure with reduced ejection fraction) (HCC) NICM (nonischemic cardiomyopathy) (HCC) Stage C, NYHA class I/II Has made a significant improvement with regards to smoking cessation and cessation of cocaine use.  He is also significantly reduced consumption of alcohol and marijuana.  Congratulated on his efforts at today's visit. Continue carvedilol  6.25 mg p.o. twice daily. Continue Farxiga  10 mg p.o. daily. Shared decision is to increase Entresto  from 49/51 mg twice daily to send 97/103 mg p.o. twice daily. Check NT proBNP and BMP in 1 week. Echo will be ordered to evaluate for structural heart disease and left ventricular systolic function.  Essential hypertension Office blood pressures acceptable but not at goal. Medications reconciled and uptitrated as discussed above  Mixed hyperlipidemia Currently on Lipitor 80 mg p.o. nightly. LDL at 72 mg/dL as per outside labs dated 08/01/2023  Polysubstance abuse (HCC) Positive lifestyle changes contributing to overall health improvement.   Orders Placed:  Orders Placed  This Encounter  Procedures   Basic metabolic panel with GFR   Pro b natriuretic peptide (BNP)   EKG 12-Lead   ECHOCARDIOGRAM COMPLETE    Standing Status:   Future    Expiration Date:   06/19/2025    Where [facility] this test [facility] performed[facility]    Does the patient weigh less than or greater than 250 lbs?:   Patient weighs less than 250 lbs    Perflutren  DEFINITY  (image enhancing agent) should be administered unless hypersensitivity or allergy exist:   Administer Perflutren     Reason for exam-Echo:   Congestive Heart Failure  I50.9     Final Medication List:    Meds ordered this encounter  Medications   ezetimibe  (ZETIA ) 10 MG tablet    Sig: Take 1 tablet (10 mg total) by mouth daily.    Dispense:  90 tablet    Refill:  3   sacubitril -valsartan  (ENTRESTO ) 97-103 MG    Sig: Take 1 tablet by mouth 2 (two) times daily.    Dispense:  180 tablet    Refill:  1    Medications Discontinued During This Encounter  Medication Reason   sacubitril -valsartan  (ENTRESTO ) 49-51 MG Dose change   ezetimibe  (ZETIA ) 10 MG tablet Reorder     Current Outpatient Medications:    atorvastatin  (LIPITOR) 80 MG tablet, Take 1 tablet (80 mg total) by mouth daily., Disp: 90 tablet, Rfl: 3   carvedilol  (COREG ) 6.25 MG tablet, Take 1 tablet (6.25 mg total) by mouth 2 (two) times daily., Disp: 120 tablet, Rfl: 3   clopidogrel  (PLAVIX ) 75 MG tablet, Take 1 tablet (75 mg total) by mouth daily., Disp: 90 tablet, Rfl: 2   dapagliflozin  propanediol (FARXIGA ) 10 MG TABS tablet, TAKE 1 TABLET(10 MG) BY MOUTH DAILY, Disp: 90 tablet, Rfl: 3   famotidine  (PEPCID ) 20 MG tablet, Take 1 tablet (20 mg total) by mouth 2 (two) times daily., Disp: 30 tablet, Rfl: 11  sacubitril -valsartan  (ENTRESTO ) 97-103 MG, Take 1 tablet by mouth 2 (two) times daily., Disp: 180 tablet, Rfl: 1   ezetimibe  (ZETIA ) 10 MG tablet, Take 1 tablet (10 mg total) by mouth daily., Disp: 90 tablet, Rfl: 3  Consent:   NA  Disposition:    78-month follow-up sooner if needed  His questions and concerns were addressed to his satisfaction. He voices understanding of the recommendations provided during this encounter.    Signed, Madonna Michele HAS, [facility]  A Division of [facility] [address], [place], [place] [address]  06/22/2024 1:42 PM

## 2024-06-20 ENCOUNTER — Other Ambulatory Visit: Payer: Self-pay | Admitting: Cardiology

## 2024-06-21 ENCOUNTER — Ambulatory Visit: Payer: Self-pay

## 2024-06-21 ENCOUNTER — Other Ambulatory Visit: Payer: Self-pay

## 2024-06-21 VITALS — BP 135/84 | HR 73 | Temp 97.7°F | Resp 28 | Ht 72.0 in | Wt 155.0 lb

## 2024-06-21 DIAGNOSIS — Z23 Encounter for immunization: Secondary | ICD-10-CM

## 2024-06-21 DIAGNOSIS — Z8673 Personal history of transient ischemic attack (TIA), and cerebral infarction without residual deficits: Secondary | ICD-10-CM | POA: Diagnosis not present

## 2024-06-21 DIAGNOSIS — F1091 Alcohol use, unspecified, in remission: Secondary | ICD-10-CM | POA: Diagnosis not present

## 2024-06-21 DIAGNOSIS — Z8249 Family history of ischemic heart disease and other diseases of the circulatory system: Secondary | ICD-10-CM | POA: Diagnosis not present

## 2024-06-21 DIAGNOSIS — K219 Gastro-esophageal reflux disease without esophagitis: Secondary | ICD-10-CM | POA: Diagnosis not present

## 2024-06-21 DIAGNOSIS — Z87891 Personal history of nicotine dependence: Secondary | ICD-10-CM | POA: Diagnosis not present

## 2024-06-21 DIAGNOSIS — Z79899 Other long term (current) drug therapy: Secondary | ICD-10-CM | POA: Diagnosis not present

## 2024-06-21 DIAGNOSIS — I5022 Chronic systolic (congestive) heart failure: Secondary | ICD-10-CM | POA: Diagnosis not present

## 2024-06-21 DIAGNOSIS — E785 Hyperlipidemia, unspecified: Secondary | ICD-10-CM | POA: Diagnosis not present

## 2024-06-21 DIAGNOSIS — Z7985 Long-term (current) use of injectable non-insulin antidiabetic drugs: Secondary | ICD-10-CM | POA: Diagnosis not present

## 2024-06-21 DIAGNOSIS — Z7902 Long term (current) use of antithrombotics/antiplatelets: Secondary | ICD-10-CM | POA: Diagnosis not present

## 2024-06-21 DIAGNOSIS — I110 Hypertensive heart disease with heart failure: Secondary | ICD-10-CM

## 2024-06-21 DIAGNOSIS — Z87898 Personal history of other specified conditions: Secondary | ICD-10-CM

## 2024-06-21 DIAGNOSIS — I10 Essential (primary) hypertension: Secondary | ICD-10-CM

## 2024-06-21 NOTE — Assessment & Plan Note (Signed)
 BP today slightly above goal at 135/84.  Was 140/86 2 days ago at his cardiology appointment.  Patient reports blood pressure at home is 140s and 150s for systolic.  He has a cuff at home and checks his BP daily.  Has Entresto  was increased recently, will monitor until next visit and see if any modifications have to be made. -Coreg  6.25 mg twice daily - Entresto  increased from 49-51 mg to 97-103 mg twice daily -Patient instructed to monitor and document BP daily - Follow-up in 3 months

## 2024-06-21 NOTE — Assessment & Plan Note (Signed)
 Lipid panel on 1/25 showed slight elevation in LDL at 71.  Patient reports taking atorvastatin .  Will repeat lipid panel today -Continue atorvastatin  80 mg - Lipid panel today

## 2024-06-21 NOTE — Assessment & Plan Note (Signed)
 Patient reports his GERD symptoms have reduced and he has not experienced any in the last 2 to 3 weeks.  He takes famotidine  20 mg as needed 1-2 times per week if he experiences symptoms like burning in his chest.

## 2024-06-21 NOTE — Assessment & Plan Note (Signed)
 Patient reports he quit smoking 6 to 7 months ago.  He had tried Chantix  then.  He does not have any cravings anymore.  Informed him to let us  know if he does develop any cravings and we can start him on Chantix  again.  Patient stated understanding.

## 2024-06-21 NOTE — Assessment & Plan Note (Signed)
 Patient's last office visit at [facility] was on 03/22/2024.  He was recommended then to continue taking his medications and to follow-up with cardiology.  Echo performed then showed worsening of EF from 35-45% in 2024 to 30 to 35% in 03/2024.  It was recommended that he follow-up with cardiac MRI to further evaluate his echocardiogram results.  Patient re-established care with cardiology on 06/19/2024 where he saw Dr. Michele.  Dr. Michele increased his Entresto  dosing from 49 - 51 mg twice daily to 97-103 mg twice daily.  Patient is required to get the following lab work in 1 week through cardiology: BMP, proBNP.  Patient reported that he would go this upcoming Wednesday and get labs done.  Patient is also scheduled for an appointment to get an echocardiogram on 07/31/2023 through cardiology--he knows about it and said he will be following up with that as well.  Patient denies any acute complaints of CP, SOB, orthopnea, bilateral lower extremity swelling.  Physical exam findings were unremarkable.  -Continue Coreg  6.25mg  BID -Farxiga  10 mg dail -Entresto  dose changed from 49-51 mg BID to 97-103 BID -Follow-up with us  in 3 months for routine visit -Follow-up with cardiology in 6 months

## 2024-06-21 NOTE — Patient Instructions (Signed)
 Please follow instructions as discussed in today's plan: - Continue taking your heart failure medications: Coreg , Farxiga , Entresto  as prescribed - Please get your labs done in a week and your echocardiogram on 07/30/2024, as discussed with your cardiologist  - You can follow-up with us  in 3 months -Please check your blood pressure every day and documented in a log.  Bring this log during your next visit with us  in 3 months.  Thank You  Rebecka Pion, DO

## 2024-06-21 NOTE — Assessment & Plan Note (Signed)
 Reports complete abstinence.  Only had 1 beer during Thanksgiving but nothing after that.  Praised the patient for quitting alcohol and encouraged him to keep up the good work.

## 2024-06-21 NOTE — Progress Notes (Signed)
 CC: Follow-up  HPI:  Sean Leblanc is a 63 y.o. male living with a history stated below and presents today for follow-up. Please see problem based assessment and plan for additional details.  Past Medical History:  Diagnosis Date   Abnormal EKG    Acid reflux 06/30/2023   Anginal equivalent    Atherosclerosis of aorta    Cardiomyopathy (HCC)    CHF (congestive heart failure) (HCC)    Cocaine use 11/19/2022   Continuous dependence on cigarette smoking    Coronary artery spasm 02/26/2021   Coronary atherosclerosis due to calcified coronary lesion    Diplopia 06/12/2023   Dyspnea on exertion    ETOH abuse 11/19/2022   Hyperlipidemia    Hypertension    Hypertensive urgency 03/04/2016   Inclusion cyst 07/08/2020   Marijuana use 11/19/2022   Nonischemic cardiomyopathy (HCC) 11/19/2022   Nonobstructive atherosclerosis of coronary artery    NSTEMI (non-ST elevated myocardial infarction) (HCC)    Orthostatic hypotension 11/18/2022   TIA (transient ischemic attack)     Medications Ordered Prior to Encounter[1]  Family History  Problem Relation Age of Onset   Heart attack Mother    Cancer Father     Social History   Socioeconomic History   Marital status: Single    Spouse name: Not on file   Number of children: Not on file   Years of education: Not on file   Highest education level: Not on file  Occupational History   Not on file  Tobacco Use   Smoking status: Former    Current packs/day: 0.40    Average packs/day: 0.4 packs/day for 10.0 years (4.0 ttl pk-yrs)    Types: Cigarettes   Smokeless tobacco: Never   Tobacco comments:    1-2 per week   Vaping Use   Vaping status: Never Used  Substance and Sexual Activity   Alcohol use: Yes    Comment: 2 X weekly - 6pack - 12 pack   Drug use: Not Currently    Types: Marijuana, Codeine    Comment: Rarely.   Sexual activity: Not on file  Other Topics Concern   Not on file  Social History Narrative   Not on  file   Social Drivers of Health   Tobacco Use: Medium Risk (06/19/2024)   Patient History    Smoking Tobacco Use: Former    Smokeless Tobacco Use: Never    Passive Exposure: Not on Actuary Strain: Not on file  Food Insecurity: No Food Insecurity (06/12/2023)   Hunger Vital Sign    Worried About Running Out of Food in the Last Year: Never true    Ran Out of Food in the Last Year: Never true  Transportation Needs: No Transportation Needs (06/12/2023)   PRAPARE - Administrator, Civil Service (Medical): No    Lack of Transportation (Non-Medical): No  Physical Activity: Not on file  Stress: Not on file  Social Connections: Not on file  Intimate Partner Violence: Not At Risk (06/12/2023)   Humiliation, Afraid, Rape, and Kick questionnaire    Fear of Current or Ex-Partner: No    Emotionally Abused: No    Physically Abused: No    Sexually Abused: No  Depression (PHQ2-9): Low Risk (08/01/2023)   Depression (PHQ2-9)    PHQ-2 Score: 0  Alcohol Screen: Not on file  Housing: Low Risk (06/12/2023)   Housing    Last Housing Risk Score: 0  Utilities: Not At Risk (06/12/2023)  AHC Utilities    Threatened with loss of utilities: No  Health Literacy: Not on file    Review of Systems: ROS  Per HPI, assessment, plan There were no vitals filed for this visit.  Physical Exam: Physical Exam HENT:     Head: Normocephalic.  Cardiovascular:     Rate and Rhythm: Normal rate and regular rhythm.     Comments: No JVD noted No BL LE swelling Pulmonary:     Effort: Pulmonary effort is normal.     Breath sounds: Normal breath sounds.  Neurological:     Mental Status: He is alert.  Psychiatric:        Mood and Affect: Mood normal.      Assessment & Plan:     Patient seen with Dr. CHARLENA Eastern  Assessment & Plan Encounter for immunization Flu vaccine administered today Hyperlipidemia, unspecified hyperlipidemia type Lipid panel on 1/25 showed slight  elevation in LDL at 71.  Patient reports taking atorvastatin .  Will repeat lipid panel today -Continue atorvastatin  80 mg - Lipid panel today Chronic HFrEF (heart failure with reduced ejection fraction) (HCC) Patient's last office visit at [facility] was on 03/22/2024.  He was recommended then to continue taking his medications and to follow-up with cardiology.  Echo performed then showed worsening of EF from 35-45% in 2024 to 30 to 35% in 03/2024.  It was recommended that he follow-up with cardiac MRI to further evaluate his echocardiogram results.  Patient re-established care with cardiology on 06/19/2024 where he saw Dr. Michele.  Dr. Michele increased his Entresto  dosing from 49 - 51 mg twice daily to 97-103 mg twice daily.  Patient is required to get the following lab work in 1 week through cardiology: BMP, proBNP.  Patient reported that he would go this upcoming Wednesday and get labs done.  Patient is also scheduled for an appointment to get an echocardiogram on 07/31/2023 through cardiology--he knows about it and said he will be following up with that as well.  Patient denies any acute complaints of CP, SOB, orthopnea, bilateral lower extremity swelling.  Physical exam findings were unremarkable.  -Continue Coreg  6.25mg  BID -Farxiga  10 mg dail -Entresto  dose changed from 49-51 mg BID to 97-103 BID -Follow-up with us  in 3 months for routine visit -Follow-up with cardiology in 6 months  History of tobacco abuse Patient reports he quit smoking 6 to 7 months ago.  He had tried Chantix  then.  He does not have any cravings anymore.  Informed him to let us  know if he does develop any cravings and we can start him on Chantix  again.  Patient stated understanding. History of transient ischemic attack (TIA) Patient reports taking Plavix  75 mg daily for his TIA in 2017 History of alcohol use Reports complete abstinence.  Only had 1 beer during Thanksgiving but nothing after that.  Praised the patient for quitting  alcohol and encouraged him to keep up the good work. Essential hypertension BP today slightly above goal at 135/84.  Was 140/86 2 days ago at his cardiology appointment.  Patient reports blood pressure at home is 140s and 150s for systolic.  He has a cuff at home and checks his BP daily.  Has Entresto  was increased recently, will monitor until next visit and see if any modifications have to be made. -Coreg  6.25 mg twice daily - Entresto  increased from 49-51 mg to 97-103 mg twice daily -Patient instructed to monitor and document BP daily - Follow-up in 3 months Gastroesophageal reflux disease,  unspecified whether esophagitis present Patient reports his GERD symptoms have reduced and he has not experienced any in the last 2 to 3 weeks.  He takes famotidine  20 mg as needed 1-2 times per week if he experiences symptoms like burning in his chest.      No orders of the defined types were placed in this encounter.    Rebecka Pion, D.O. [facility] Internal Medicine, PGY-1 Date 06/21/2024 Time 8:24 AM     [1]  Current Outpatient Medications on File Prior to Visit  Medication Sig Dispense Refill   atorvastatin  (LIPITOR) 80 MG tablet Take 1 tablet (80 mg total) by mouth daily. 90 tablet 3   carvedilol  (COREG ) 6.25 MG tablet Take 1 tablet (6.25 mg total) by mouth 2 (two) times daily. 120 tablet 3   clopidogrel  (PLAVIX ) 75 MG tablet Take 1 tablet (75 mg total) by mouth daily. 90 tablet 2   dapagliflozin  propanediol (FARXIGA ) 10 MG TABS tablet TAKE 1 TABLET(10 MG) BY MOUTH DAILY 90 tablet 3   ezetimibe  (ZETIA ) 10 MG tablet Take 1 tablet (10 mg total) by mouth daily. 90 tablet 3   famotidine  (PEPCID ) 20 MG tablet Take 1 tablet (20 mg total) by mouth 2 (two) times daily. 30 tablet 11   sacubitril -valsartan  (ENTRESTO ) 97-103 MG Take 1 tablet by mouth 2 (two) times daily. 180 tablet 1   No current facility-administered medications on file prior to visit.

## 2024-06-21 NOTE — Assessment & Plan Note (Signed)
 Patient reports taking Plavix  75 mg daily for his TIA in 2017

## 2024-06-22 ENCOUNTER — Encounter: Payer: Self-pay | Admitting: Cardiology

## 2024-06-22 LAB — LIPID PANEL
Chol/HDL Ratio: 3.4 ratio (ref 0.0–5.0)
Cholesterol, Total: 103 mg/dL (ref 100–199)
HDL: 30 mg/dL — ABNORMAL LOW (ref >39)
LDL Chol Calc (NIH): 55 mg/dL (ref 0–99)
Triglycerides: 90 mg/dL (ref 0–149)
VLDL Cholesterol Cal: 18 mg/dL (ref 5–40)

## 2024-06-26 LAB — BASIC METABOLIC PANEL WITH GFR
BUN/Creatinine Ratio: 19 (ref 10–24)
BUN: 18 mg/dL (ref 8–27)
CO2: 21 mmol/L (ref 20–29)
Calcium: 9.4 mg/dL (ref 8.6–10.2)
Chloride: 101 mmol/L (ref 96–106)
Creatinine, Ser: 0.96 mg/dL (ref 0.76–1.27)
Glucose: 86 mg/dL (ref 70–99)
Potassium: 4.3 mmol/L (ref 3.5–5.2)
Sodium: 137 mmol/L (ref 134–144)
eGFR: 88 mL/min/1.73 (ref >59)

## 2024-06-26 LAB — PRO B NATRIURETIC PEPTIDE: NT-Pro BNP: <36 pg/mL (ref 0–210)

## 2024-06-27 NOTE — Progress Notes (Signed)
 [facility] Clinic Attending  I was physically present during the key portions of the resident provided service and participated in the medical decision making of patient's management care. I reviewed pertinent patient test results.  The assessment, diagnosis, and plan were formulated together and I agree with the documentation in the resident's note.  Rosan Dayton BROCKS, DO

## 2024-06-29 ENCOUNTER — Ambulatory Visit: Payer: Self-pay

## 2024-06-29 NOTE — Telephone Encounter (Signed)
 Informed pt about LDL levels being at goal. Asked him to continue taking his statin daily. Pt stated understanding.

## 2024-07-08 ENCOUNTER — Ambulatory Visit: Payer: Self-pay | Admitting: Cardiology

## 2024-07-10 ENCOUNTER — Other Ambulatory Visit: Payer: Self-pay | Admitting: Student

## 2024-07-10 DIAGNOSIS — I429 Cardiomyopathy, unspecified: Secondary | ICD-10-CM

## 2024-07-10 DIAGNOSIS — I10 Essential (primary) hypertension: Secondary | ICD-10-CM

## 2024-07-10 DIAGNOSIS — I5021 Acute systolic (congestive) heart failure: Secondary | ICD-10-CM

## 2024-07-10 NOTE — Telephone Encounter (Signed)
 Medication sent to pharmacy

## 2024-07-30 ENCOUNTER — Ambulatory Visit (HOSPITAL_COMMUNITY)
Admission: RE | Admit: 2024-07-30 | Discharge: 2024-07-30 | Disposition: A | Discharge status: Home / Self Care | Source: Ambulatory Visit | Attending: Cardiology | Admitting: Cardiology

## 2024-07-30 DIAGNOSIS — I509 Heart failure, unspecified: Secondary | ICD-10-CM | POA: Diagnosis not present

## 2024-07-30 DIAGNOSIS — I5022 Chronic systolic (congestive) heart failure: Secondary | ICD-10-CM | POA: Insufficient documentation

## 2024-07-30 MED ORDER — PERFLUTREN LIPID MICROSPHERE
1.0000 mL | INTRAVENOUS | Status: AC | PRN
  Administered 2024-07-30: 2 mL via INTRAVENOUS

## 2024-07-31 LAB — ECHOCARDIOGRAM COMPLETE
Area-P 1/2: 3.4 cm2
S' Lateral: 3.4 cm
